# Patient Record
Sex: Female | Born: 1970
Health system: Southern US, Community
[De-identification: ages and names within clinical notes are randomized; demographics above are authoritative.]

## PROBLEM LIST (undated history)

## (undated) DIAGNOSIS — R6250 Unspecified lack of expected normal physiological development in childhood: Secondary | ICD-10-CM

## (undated) DIAGNOSIS — R569 Unspecified convulsions: Secondary | ICD-10-CM

## (undated) DIAGNOSIS — G40409 Other generalized epilepsy and epileptic syndromes, not intractable, without status epilepticus: Secondary | ICD-10-CM

## (undated) HISTORY — DX: Unspecified lack of expected normal physiological development in childhood: R62.50

## (undated) HISTORY — DX: Unspecified convulsions: R56.9

## (undated) HISTORY — DX: Other generalized epilepsy and epileptic syndromes, not intractable, without status epilepticus: G40.409

## (undated) HISTORY — PX: OTHER SURGICAL HISTORY: SHX169

---
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
## 1999-04-18 HISTORY — PX: OTHER SURGICAL HISTORY: SHX169

## 2002-06-26 ENCOUNTER — Other Ambulatory Visit: Admission: RE | Admit: 2002-06-26 | Discharge: 2002-06-26 | Payer: Self-pay | Admitting: Obstetrics & Gynecology

## 2003-06-30 ENCOUNTER — Other Ambulatory Visit: Admission: RE | Admit: 2003-06-30 | Discharge: 2003-06-30 | Payer: Self-pay | Admitting: Obstetrics & Gynecology

## 2004-12-07 ENCOUNTER — Other Ambulatory Visit: Admission: RE | Admit: 2004-12-07 | Discharge: 2004-12-07 | Payer: Self-pay | Admitting: Obstetrics & Gynecology

## 2011-01-20 DIAGNOSIS — G40219 Localization-related (focal) (partial) symptomatic epilepsy and epileptic syndromes with complex partial seizures, intractable, without status epilepticus: Secondary | ICD-10-CM | POA: Insufficient documentation

## 2011-05-30 ENCOUNTER — Encounter: Payer: Self-pay | Admitting: Neurology

## 2011-06-07 ENCOUNTER — Telehealth: Payer: Self-pay | Admitting: Neurology

## 2011-06-07 ENCOUNTER — Encounter: Payer: Self-pay | Admitting: Neurology

## 2011-06-07 ENCOUNTER — Ambulatory Visit (INDEPENDENT_AMBULATORY_CARE_PROVIDER_SITE_OTHER): Payer: Medicaid Other | Admitting: Neurology

## 2011-06-07 VITALS — BP 90/64 | HR 74 | Ht 66.5 in | Wt 124.7 lb

## 2011-06-07 DIAGNOSIS — G40909 Epilepsy, unspecified, not intractable, without status epilepticus: Secondary | ICD-10-CM

## 2011-06-07 MED ORDER — RISPERIDONE 0.5 MG PO TBDP: ORAL_TABLET | ORAL | Status: DC

## 2011-06-07 NOTE — Progress Notes (Signed)
Dear Dr. Reuel Boom,  Thank you for having me see Molly Duffy in consultation today at [facility] for her problem with seizures.  As you may recall, she is a 41 y.o. year old female with a history of developmental delay of unknown etiology who has had seizures since the age of [age] months.  These started with what sound like focal clonic seizures of the left hemibody.  She was initially placed on phenobarbital for her focal seizures, but because of sedation her mother immediately weaned her off it.  Several weeks later she developed generalized tonic clonc seizures.  She ended up being admitted to [facility] and apparently had a prolonged GTCS that required ICU care.  At that time, she was placed on dilantin and her development started to slow.  She did walk and talk at appropriate times.  She then began to have a chronic course of generalized tonic clonic seizures occuring about 1-2 times per month.  Many of the times these occurred with fever.  At some time she developed short absence seizures.  At 9 she was switched to Depakene, and Zarontin and then had mysoline added.  It also sounds like she has possible atonic seizures.  She moved her care from the [place] area to [place] at around 23.  She initially saw Billie Ruddy who attempted to start her on felbamate, but this caused weight loss.  She then was seen by Dr. Alvester Morin at [facility] who started her in a vigabatrin study.  Her seizures worsened at that time.  Neurontin was also added.  Over the last year, Molly Duffy has had a dramatic increased in GTCS frequency.  Over the last several months she has had an increase to over 20 per month.  Her mother has also noted an increase in what sound like agitated behaviors with continuous rocking that can last for days on end.  Her mother is giving her ativan for these rocking spells.  Over the last year her zonisamide has been decreased from a high of 75mg  down to 25mg  but because of worsening seizures was put back to  75mg .  Banzel was decreased from 500mg  per day to off because of sedation.  However, this was done in October and based on her seizure calendar there was an increase in her gtcs frequency at that time.  Clobazam was tried for 1 day in march, but because of an increase in her rocking behaviors this was stopped.  She has recently been started on the modified [employer] in early January.  It is unclear the frequency of her other seizure types or their nature.  It sounds like she may be having atonic seizures as well - where she just crumples.  As for the frequency of absence seizures this is unknown.  No clear history of myoclonic seizures.  Previous medications/interventions include: 1.  Depakene/Depakote - has had problems with thrombocytopenia(highest dose 500/750) 2.  phenobarbital - ineffective 3.  Mysoline - ineffective 4.  ethosuximide - ineffective 5.  Felbamate - weight loss 6.  vigabatrin - worsening seizures 7.  Neurontin - ineffective 8. clobazam - increased behaviors - only tried for a day 9.  banzel - sedation at does above 500mg  per day(!), did appear to help seizures 10. Dilantin - ineffective 11. VNS - not effective, still in place, but battery dead. 12.  Lamictal - ineffective 13.  Topamax - ineffective 14.  Keppra - ineffective  Last MRI brain 2001 1.  No obvious anomaly to account for  seizure disorder. 2.  Focal cerebellar atrophy and thickening of the skull to suggest chronic seizure medication use.  Routine EEG 2001 1.  Multifocal sharp waves. 2.  Generalized slow back ground  EMU Admission sept 99 - Event 1 turns head to left, rocks her body side to side and then leans forward; electrographically fast 12 Hz activity over right hemisphere, higher over frontal central area - Event 2 turns head to the right, rocks here body side to side; rhythmic fast seen over right hemisphere, higher over frontal central region, then rapidly spreads - Event 3 similar clinically to  event 1 but bilateral fast activities - captured 36 seizures over 2 hours.  EMU Admission December 99 Event 1 - Staring spell and then started to moan.  Rocks body from side to side, and then sits electrographically generalized fast activity. Event 2 - Staring spell and stops talking to her mother;  Raises head up and bends her body forward.  Clonic movements of the neck and eyelid twitching are noted.  Generalized fast with frontal predominance.   Event 3 - Black stare, turns neck to the right, with clonic movements of the face, twitching of both upper extremities then evolving to gtcs;  Again starts as generalized fast.  Past Medical History  Diagnosis Date  . Seizures   . Osteoporosis   . Developmental delay   . 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 Vns implant 2001    has been turned off  . Dental implants     History   Social History  . Marital Status: Single    Spouse Name: N/A    Number of Children: N/A  . Years of Education: N/A   Social History Main Topics  . Smoking status: Never Smoker   . Smokeless tobacco: Never Used  . Alcohol Use: No  . Drug Use: None  . Sexually Active: None   Other Topics Concern  . None   Social History Narrative  . None    No family history of neurologic disease.  Meds include: 1.  Divalprox 375/250 2.  lorazepam 1mg  prn 3.  zonisamide 75mg  qhs 4.  Vitamin D 5.  calcitonin  No Known Allergies    ROS:  13 systems were reviewed and are notable for ?constipation, patient has daily bowel movements but has "large stools" that her mother thinks are painful.  Patient has severe problem sleeping, typically gets up several hours after she has fallen asleep.  Has tried [place], rozerem, melatonin in the past with no effect.  Developmental delay as above.  All other review of systems are unremarkable.   Examination:  Filed Vitals:   06/07/11 1149  BP: 90/64  Pulse: 74  Height: 5'  6.5" (1.689 m)  Weight: 124 lb 11.2 oz (56.564 kg)     In general, thin appearing women, with thin face.  Cardiovascular: The patient has a regular rate and rhythm and no carotid bruits.  Fundoscopy:  Limited by patient cooperation.  Mental status:   Alert follows some commands.  Cranial Nerves: Pupils are equally round and reactive to light. Extraocular movements are intact without nystagmus. Facial sensation and muscles of mastication are intact. Muscles of facial expression are symmetric.Tongue protrusion, uvula, palate midline.    Motor:  The patient has normal bulk and tone. There are no adventitious movements. Grip strength the symmetric bilaterally.  Reflexes:  Quiet throughout.  Toes down  Coordination:  Some incoordination  reaching for objects.  Withdrawals to tickle bilaterally.  Gait and Station are wide based.   Impression/Recs 1.  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  The patient is currently under treated and I suspect this is due to parent's weariness with respect to medications in the past.  There does seem to be some correlation between the recent worsening of her seizures and weaning her off the Banzel.  However, the family would like to give the modified [employer] a while to work.  I would advocate no more than several more weeks to see if it is helping. I think I would then try to either increase her zonisamide aggressively or put her back on the Banzel.  I am going to see if we can get SCN1A testing as well as a [facility] given her syndromic appearance.  While this will not change management it may give the family some answers.  The spells of what appear to be abnormal behaviors of rocking back and forth and appearing in distress at first appeared to me as behavioral.  However, I am worried that they may represent "subclinical" epileptic activity.   I think it  would be invaluable for her to be admitted to the EMU at [facility] to determine if the spells of rocking and agitation are associated with epileptiform activity. 2.  Lack of sleep - It is possible the patient is having multiple seizures at night that is causing her sleep disturbance.  This would be captured during an EMU admission.  I have given the family risperidone 0.5mg  qhs to see if it can help her sleep as well.    I spent over 50% of this 80 minute visit counseling patient and family.  We will see the patient back in 6 weeks.  Thank you for having [place] see Molly Duffy in consultation.  Feel free to contact me with any questions.  Lupita Raider Modesto Charon, MD [facility], [place] [address] [place], [place] [address] Phone: [phone] Fax: [phone].

## 2011-06-07 NOTE — Telephone Encounter (Signed)
Called and spoke with the pharmacist. Verbal request given for the Risperdal M-tabs; #60; refill x 3; sig: take one at HS. May use 0.5 tab prn agitation up to twice a day. Pharmacist states they will not have the med until tomorrow. Called the patient's mother on her mobile number and let her know that the med was called but not available until tomorrow.

## 2011-06-07 NOTE — Patient Instructions (Addendum)
Go to the basement to have your labs drawn today.  Your next appointment with Dr. Modesto Charon is scheduled on 4/5 at 2:30.    [phone].

## 2011-06-11 ENCOUNTER — Encounter: Payer: Self-pay | Admitting: Neurology

## 2011-06-13 ENCOUNTER — Other Ambulatory Visit: Payer: Self-pay | Admitting: Neurology

## 2011-07-20 ENCOUNTER — Ambulatory Visit: Payer: Self-pay | Admitting: Neurology

## 2011-07-21 ENCOUNTER — Ambulatory Visit: Payer: Medicaid Other | Admitting: Neurology

## 2011-08-04 ENCOUNTER — Encounter: Payer: Self-pay | Admitting: Neurology

## 2011-08-04 ENCOUNTER — Ambulatory Visit (INDEPENDENT_AMBULATORY_CARE_PROVIDER_SITE_OTHER): Payer: Medicaid Other | Admitting: Neurology

## 2011-08-04 VITALS — BP 100/60 | HR 84 | Wt 133.0 lb

## 2011-08-04 DIAGNOSIS — G40409 Other generalized epilepsy and epileptic syndromes, not intractable, without status epilepticus: Secondary | ICD-10-CM

## 2011-08-04 DIAGNOSIS — G40909 Epilepsy, unspecified, not intractable, without status epilepticus: Secondary | ICD-10-CM

## 2011-08-04 MED ORDER — CLOBAZAM 5 MG PO TABS: 20.0000 mg | ORAL_TABLET | Freq: Every day | ORAL | Status: DC

## 2011-08-04 NOTE — Progress Notes (Signed)
Dear Dr. Reuel Boom,  I saw  Molly Duffy back in [facility] for her problem with symptomatic generalized epilepsy(possible LGS), and static encephalopathy.  As you may recall, she is a 41 y.o. year old female with a history of static encephalopathy of unknown etiology and both generalized tonic clonic and possible prolonged atypical absence like events.  She continues to have uncontrolled generalized tonic clonic seizures.  These typically occur in clusters and if spaces close together she does not return to normal between the events.  If they are less than 1 hour apart she typically has repetitive rocking movements between the events and this signals that she needs to go to the ED for Ativan injection.    When I first saw her she had started the modified Adkins diet and was on Depakote 250/375 and zonisamide 75 qhs.  She was have in excess of greater than 30 generalized tonic clonic seizures a month.  She has stopped the MAD.  Her Depakote was also recently increase to 375 due to decreased levels in the 40s.  Her latest platelets were 90.  She also has prolonged behavioral spells of rocking and agitation that can last days.  She was admitted to the EMU at [facility] to determine if these spells were seizures.  During the rocking and agitation spells she developed bifrontal slow activity, which was thought to possibly related to seizure.  She also had what sounds like tonic deviation of the eyes, stiffening of the arms- this was apparently captured during her admission as well although whether this was associated with any changes is unknown to me.  Because of poor venous access she got a port-a-cath placed.  She was also started on clobazam and this was increased to 15mg  qhs.  She initially did quite well on it, with 4 days of normal behavior and sleep.  However, she then began having her spells of rocking and agitation.  She has had two clusters of generalized tonic clonic seizures that  required her to go to the ED.     The folks at [facility] have also provided her with a midazolam 5mg  in each nostril to be used for her refractory seizures.  They used this once in the ED, but have not used it at home yet.  They do not have the nasal atomizers.  They also showed me a video of what appears to be decreased alertness, although she is still "working on a puzzle".  She has right arm jerking/tremors during this spell.     Medical history, social history, and family history were reviewed and have not changed since the last clinic visit.  Meds: 1.  Depakote 375 bid 2.  Zonisamide 75mg  qhs 3.  clobazam 15mg  qhs.   Allergies  Allergen Reactions  . Gabitril     Makes seizures worse  . Keppra     Makes seizures worse  . Phenobarbital     Makes seizures worse.    ROS:  13 systems were reviewed and are notable for some ataxia with the clobazam.  They also think her behavior may be worse on the clobazam.  She continues to have difficulty sleeping.  We tried a small dose of risperidone to no effect qhs.  However, they have stopped that for now as they want to try to focus on having her.  All other review of systems are unremarkable.  Exam: . Filed Vitals:   08/04/11 1158  BP: 100/60  Pulse: 84  Weight:  133 lb (60.328 kg)    In general, thin girl with narrow pixie like face.  Alert, EOM full, no nystagmus.  Motor:  Moves all limbs equally.  Gait:  Mildly ataxic gait.    Impression/Recommendations:  1.  SGE - I am going to increase her morning clobazam to 5mg /15mg .  We can continue to increase this as tolerated.  We may consider restarting the Banzel at a later date.  I am assuming that the spells of rocking are ictal for now.  I unfortunately could not open the continuous EEG on disk so I will contact the folks at [facility] about getting another copy.  We will see the patient back in 6 weeks.  Lupita Raider Modesto Charon, MD [facility], [place]    More than 50% of  this 40 minute appointment was spent counselling the patient.

## 2011-08-17 ENCOUNTER — Telehealth: Payer: Self-pay

## 2011-08-17 NOTE — Telephone Encounter (Signed)
Pt's mother called and pt is having a bad day and has had one grang mal seizure today and her mom feels she is going to have more and she wants to know if she should go ahead and give her the versed prescribed by [facility] or should she wait until she has more seizures.   [phone].

## 2011-08-21 NOTE — Telephone Encounter (Signed)
Dr. Modesto Charon spoke with pt's mother.

## 2011-10-06 ENCOUNTER — Telehealth: Payer: Self-pay | Admitting: Neurology

## 2011-10-06 NOTE — Telephone Encounter (Signed)
Message copied by Benay Spice on Fri Oct 06, 2011  1:18 PM ------      Message from: Milas Gain      Created: Fri Oct 06, 2011  9:38 AM       Jan.  Could you give Molly Duffy's mom a call and let her know that I heard from Nigel Berthold that the nasal midazolam was not working.  We can try intranasal lorazepam if they want, but I need to know the dose of IV lorazepam she usually gets at the ED that stops her seizures so we can dose the intranasal lorazepam.            THx.  M

## 2011-10-06 NOTE — Telephone Encounter (Signed)
Spoke with the patient's mother. She states they were just in the ER this morning. She gets at least Ativan 2 mg IV for the seizures; sometimes it even takes 3 mg IV. She is very frustrated and needs something to keep them out of the ER. She is also wondering about a port-a-cath in order to administer IV meds herself. She has a follow up appointment with Dr. Modesto Charon next Friday, June 28th. She hopes to discuss other options at this time.

## 2011-10-13 ENCOUNTER — Encounter: Payer: Self-pay | Admitting: Neurology

## 2011-10-13 ENCOUNTER — Ambulatory Visit (INDEPENDENT_AMBULATORY_CARE_PROVIDER_SITE_OTHER): Payer: Medicaid Other | Admitting: Neurology

## 2011-10-13 VITALS — BP 110/66 | HR 78 | Wt 130.0 lb

## 2011-10-13 DIAGNOSIS — G40309 Generalized idiopathic epilepsy and epileptic syndromes, not intractable, without status epilepticus: Secondary | ICD-10-CM

## 2011-10-13 DIAGNOSIS — G40834 Dravet syndrome, intractable, without status epilepticus: Secondary | ICD-10-CM

## 2011-10-13 MED ORDER — LORAZEPAM 2 MG/ML IJ SOLN: INTRAMUSCULAR | Status: DC

## 2011-10-15 NOTE — Progress Notes (Signed)
Dear Dr. Reuel Boom,  I saw  Molly Duffy back in [facility] for her problem with her symptomatic generalized epilepsy.  As you may recall, she is a 41 y.o. year old female with a history of static encephalopathy and SGE previously diagnosed at [facility].  She currently has very frequent generalized tonic clonic seizures as well as prolonged what I assume to be atypical absence seizures.  At her last visit I increased her clobazam 5/15.  However, they only kept her on the higher dose of clobazam for 2 weeks as they felt it made her atypical absence spells worse.  She remains on the Depakote 375 bid(limited dosing by thrombocytopenia) and zonegran 75mg  qh(sedation) as well.  She is having generalized tonic clonic seizures at least once per week.  She is having frequent atypical absence seizures in between these events that can go on for days.  They are characterized by varying degrees of responsiveness, rocking, downward gaze, and eyelid twitching.    She had a port recently placed as she was going to the ED so often for the seizures and getting ativan.  Her family tried midazolam 5mg  per nostril and this seemed to stop her seizure, but they restarted in to 1 hour.  They are interested in trying IN lorazepam.   Medical history, social history, and family history were reviewed and have not changed since the last clinic visit.  Current Outpatient Prescriptions on File Prior to Visit  Medication Sig Dispense Refill  . calcitonin, salmon, (MIACALCIN/FORTICAL) 200 UNIT/ACT nasal spray Place 1 spray into the nose daily.      . cholecalciferol (VITAMIN D) 1000 UNITS tablet Take 1,000 Units by mouth daily.      . CLOBAZAM PO Take 15 mg by mouth daily.      . divalproex (DEPAKOTE SPRINKLE) 125 MG capsule Take 125 mg by mouth AC breakfast. Take with the 250 mg Depakote to equal 375 mg TID      . divalproex (DEPAKOTE) 250 MG DR tablet Take 250 mg by mouth at bedtime.      .  fish oil-omega-3 fatty acids 1000 MG capsule Take 1 g by mouth daily.      Marland Kitchen LORazepam (ATIVAN) 1 MG tablet Take 1 mg by mouth every 8 (eight) hours. One or two for seizures as needed.      . magnesium oxide (MAG-OX) 400 MG tablet Take 400 mg by mouth daily.      . Multiple Vitamin (MULTIVITAMIN) tablet Take 1 tablet by mouth daily.      . nitrofurantoin, macrocrystal-monohydrate, (MACROBID) 100 MG capsule Take 100 mg by mouth 2 (two) times daily. 50 mg prn UTI      . zonisamide (ZONEGRAN) 25 MG capsule Take 75 mg by mouth daily.      . Clobazam 5 MG TABS Take 20 mg by mouth daily.  120 tablet  3  . risperiDONE (RISPERDAL M-TABS) 0.5 MG disintegrating tablet use 0.5 tab before bed.  May use 0.5 tab as needed for agitation up to twice per day.  60 tablet  3    Allergies  Allergen Reactions  . Levetiracetam     Makes seizures worse  . Phenobarbital     Makes seizures worse.  . Tiagabine Hcl     Makes seizures worse    ROS:  13 systems were reviewed and are notable for static encephalopathy.  All other review of systems are unremarkable.  Exam: . Filed Vitals:  10/13/11 1135  BP: 110/66  Pulse: 78  Weight: 130 lb (58.968 kg)    In general, thin appearing women. She has frequent spells of decreased alertness but more vocalization with twitching of eyes and downward deviation of eyes.  These seem to be interruptible to some extent.  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  Chromosomal microarray was negative.  Impression/Recommendations:  1.  Dravet's Syndrome - I have encouraged them to try the clobazam 5/15 for at least 1 month.  I emphasized that we need at least a month to assess response.  I have also written them a prescription for Ativan 1mg  IN give 0.35ml in each nostril.  I am interested in whether this stops her seizures and then keeps them stopped.  We will see the patient back in 2 months.  Lupita Raider Modesto Charon, MD [facility],  [place]    Over 50% of this 40 minute appointment was spent counseling the patient and family.

## 2011-10-16 DIAGNOSIS — G40834 Dravet syndrome, intractable, without status epilepticus: Secondary | ICD-10-CM | POA: Insufficient documentation

## 2011-12-01 ENCOUNTER — Telehealth: Payer: Self-pay | Admitting: Neurology

## 2011-12-01 NOTE — Telephone Encounter (Signed)
[facility] called to advise that pt's 5 mg Clobazam has been discontinued. The pharmacist says they can only do 10 mg or 20 mg now. How should he refill the rx?

## 2011-12-04 ENCOUNTER — Other Ambulatory Visit: Payer: Self-pay | Admitting: Neurology

## 2011-12-04 MED ORDER — CLOBAZAM 10 MG PO TABS: 10.0000 mg | ORAL_TABLET | ORAL | Status: DC

## 2011-12-04 NOTE — Telephone Encounter (Signed)
dispense clobazam 10 mg tabs, #60, 5 refills, take 0.5 in the am and 1.5 at night.

## 2011-12-04 NOTE — Telephone Encounter (Signed)
Please call [facility] and give them the instructions below. Thanks.

## 2011-12-04 NOTE — Telephone Encounter (Signed)
Called the pharmacy. Information given as per Dr. Modesto Charon below.

## 2011-12-06 ENCOUNTER — Other Ambulatory Visit: Payer: Self-pay | Admitting: Neurology

## 2011-12-06 ENCOUNTER — Telehealth: Payer: Self-pay | Admitting: Neurology

## 2011-12-06 MED ORDER — LORAZEPAM 4 MG/ML IJ SOLN: INTRAMUSCULAR | Status: AC

## 2011-12-06 NOTE — Telephone Encounter (Signed)
Called new script in as directed by Dr. Modesto Charon to [facility]: Ativan IV 4 mg/ml; sig: 0.5 mg (2 mg) in one nostril prn prolonged seizure; no refills; dispense 5 mls. [phone].

## 2011-12-11 ENCOUNTER — Telehealth: Payer: Self-pay

## 2011-12-11 NOTE — Telephone Encounter (Signed)
Call from pt's mom, they reduced her depakote and her level went up to 99 from 80 last week and her platelet count went from 82 to 105.  She is aware that Dr. Modesto Charon is out of town this week and is going to check with her other doctor.

## 2011-12-12 NOTE — Telephone Encounter (Signed)
If I am to understand correctly I decreased her Depakote DR to 250/375 and her level went up.  I am not too worried about this, particularly since her plts are ok.  You can tell Molly Duffy that I looked up papers on Depakote and Onfi interactions and Onfi does seem to increase the Depakote level.

## 2011-12-13 NOTE — Telephone Encounter (Signed)
Pt's mom aware, she did speak with Adelyne's doctors at [facility] and they decreased her depakote another 125mg  in the morning.

## 2011-12-19 ENCOUNTER — Other Ambulatory Visit: Payer: Self-pay | Admitting: Neurology

## 2011-12-19 ENCOUNTER — Ambulatory Visit: Payer: Medicaid Other | Admitting: Neurology

## 2011-12-19 MED ORDER — CLOBAZAM 10 MG PO TABS: 10.0000 mg | ORAL_TABLET | ORAL | Status: DC

## 2014-06-09 DIAGNOSIS — T82898D Other specified complication of vascular prosthetic devices, implants and grafts, subsequent encounter: Secondary | ICD-10-CM | POA: Diagnosis not present

## 2014-07-21 DIAGNOSIS — T82858A Stenosis of vascular prosthetic devices, implants and grafts, initial encounter: Secondary | ICD-10-CM | POA: Diagnosis not present

## 2014-07-21 DIAGNOSIS — Z452 Encounter for adjustment and management of vascular access device: Secondary | ICD-10-CM | POA: Diagnosis not present

## 2014-07-21 DIAGNOSIS — Z79899 Other long term (current) drug therapy: Secondary | ICD-10-CM | POA: Diagnosis not present

## 2014-07-21 DIAGNOSIS — G40309 Generalized idiopathic epilepsy and epileptic syndromes, not intractable, without status epilepticus: Secondary | ICD-10-CM | POA: Diagnosis not present

## 2014-07-21 DIAGNOSIS — G40409 Other generalized epilepsy and epileptic syndromes, not intractable, without status epilepticus: Secondary | ICD-10-CM | POA: Diagnosis not present

## 2014-08-06 DIAGNOSIS — M810 Age-related osteoporosis without current pathological fracture: Secondary | ICD-10-CM | POA: Diagnosis not present

## 2014-08-06 DIAGNOSIS — Z1389 Encounter for screening for other disorder: Secondary | ICD-10-CM | POA: Diagnosis not present

## 2014-08-06 DIAGNOSIS — K0262 Dental caries on smooth surface penetrating into dentin: Secondary | ICD-10-CM | POA: Diagnosis not present

## 2014-08-06 DIAGNOSIS — G40309 Generalized idiopathic epilepsy and epileptic syndromes, not intractable, without status epilepticus: Secondary | ICD-10-CM | POA: Diagnosis not present

## 2014-08-06 DIAGNOSIS — Z9189 Other specified personal risk factors, not elsewhere classified: Secondary | ICD-10-CM | POA: Diagnosis not present

## 2014-09-01 DIAGNOSIS — T859XXD Unspecified complication of internal prosthetic device, implant and graft, subsequent encounter: Secondary | ICD-10-CM | POA: Diagnosis not present

## 2014-09-01 DIAGNOSIS — D689 Coagulation defect, unspecified: Secondary | ICD-10-CM | POA: Diagnosis not present

## 2014-09-01 DIAGNOSIS — T82868A Thrombosis of vascular prosthetic devices, implants and grafts, initial encounter: Secondary | ICD-10-CM | POA: Diagnosis not present

## 2014-09-01 DIAGNOSIS — G40309 Generalized idiopathic epilepsy and epileptic syndromes, not intractable, without status epilepticus: Secondary | ICD-10-CM | POA: Diagnosis not present

## 2014-09-03 ENCOUNTER — Other Ambulatory Visit: Payer: Self-pay | Admitting: Obstetrics & Gynecology

## 2014-09-03 DIAGNOSIS — Z1231 Encounter for screening mammogram for malignant neoplasm of breast: Secondary | ICD-10-CM

## 2014-09-23 DIAGNOSIS — Z1231 Encounter for screening mammogram for malignant neoplasm of breast: Secondary | ICD-10-CM | POA: Diagnosis not present

## 2014-09-24 DIAGNOSIS — G40309 Generalized idiopathic epilepsy and epileptic syndromes, not intractable, without status epilepticus: Secondary | ICD-10-CM | POA: Diagnosis not present

## 2014-10-15 DIAGNOSIS — Z79899 Other long term (current) drug therapy: Secondary | ICD-10-CM | POA: Diagnosis not present

## 2014-10-15 DIAGNOSIS — G40409 Other generalized epilepsy and epileptic syndromes, not intractable, without status epilepticus: Secondary | ICD-10-CM | POA: Diagnosis not present

## 2014-10-15 DIAGNOSIS — E7220 Disorder of urea cycle metabolism, unspecified: Secondary | ICD-10-CM | POA: Diagnosis not present

## 2014-10-15 DIAGNOSIS — D689 Coagulation defect, unspecified: Secondary | ICD-10-CM | POA: Diagnosis not present

## 2014-12-09 DIAGNOSIS — Z95828 Presence of other vascular implants and grafts: Secondary | ICD-10-CM | POA: Diagnosis not present

## 2015-01-28 DIAGNOSIS — G40309 Generalized idiopathic epilepsy and epileptic syndromes, not intractable, without status epilepticus: Secondary | ICD-10-CM | POA: Diagnosis not present

## 2015-02-04 DIAGNOSIS — Z23 Encounter for immunization: Secondary | ICD-10-CM | POA: Diagnosis not present

## 2015-03-04 DIAGNOSIS — M810 Age-related osteoporosis without current pathological fracture: Secondary | ICD-10-CM | POA: Diagnosis not present

## 2015-03-04 DIAGNOSIS — G40309 Generalized idiopathic epilepsy and epileptic syndromes, not intractable, without status epilepticus: Secondary | ICD-10-CM | POA: Diagnosis not present

## 2015-03-05 DIAGNOSIS — Z452 Encounter for adjustment and management of vascular access device: Secondary | ICD-10-CM | POA: Diagnosis not present

## 2015-03-05 DIAGNOSIS — Z9889 Other specified postprocedural states: Secondary | ICD-10-CM | POA: Diagnosis not present

## 2015-03-30 DIAGNOSIS — Z124 Encounter for screening for malignant neoplasm of cervix: Secondary | ICD-10-CM | POA: Diagnosis not present

## 2015-04-06 DIAGNOSIS — N959 Unspecified menopausal and perimenopausal disorder: Secondary | ICD-10-CM | POA: Diagnosis not present

## 2015-04-06 DIAGNOSIS — G40309 Generalized idiopathic epilepsy and epileptic syndromes, not intractable, without status epilepticus: Secondary | ICD-10-CM | POA: Diagnosis not present

## 2015-05-20 DIAGNOSIS — G40409 Other generalized epilepsy and epileptic syndromes, not intractable, without status epilepticus: Secondary | ICD-10-CM | POA: Diagnosis not present

## 2015-05-20 DIAGNOSIS — Z452 Encounter for adjustment and management of vascular access device: Secondary | ICD-10-CM | POA: Diagnosis not present

## 2015-05-20 DIAGNOSIS — G40219 Localization-related (focal) (partial) symptomatic epilepsy and epileptic syndromes with complex partial seizures, intractable, without status epilepticus: Secondary | ICD-10-CM | POA: Diagnosis not present

## 2015-07-15 DIAGNOSIS — R635 Abnormal weight gain: Secondary | ICD-10-CM | POA: Diagnosis not present

## 2015-07-15 DIAGNOSIS — D689 Coagulation defect, unspecified: Secondary | ICD-10-CM | POA: Diagnosis not present

## 2015-07-15 DIAGNOSIS — G40409 Other generalized epilepsy and epileptic syndromes, not intractable, without status epilepticus: Secondary | ICD-10-CM | POA: Diagnosis not present

## 2015-07-15 DIAGNOSIS — Z452 Encounter for adjustment and management of vascular access device: Secondary | ICD-10-CM | POA: Diagnosis not present

## 2015-08-25 DIAGNOSIS — Z1389 Encounter for screening for other disorder: Secondary | ICD-10-CM | POA: Diagnosis not present

## 2015-08-25 DIAGNOSIS — M810 Age-related osteoporosis without current pathological fracture: Secondary | ICD-10-CM | POA: Diagnosis not present

## 2015-08-25 DIAGNOSIS — Z9189 Other specified personal risk factors, not elsewhere classified: Secondary | ICD-10-CM | POA: Diagnosis not present

## 2015-08-25 DIAGNOSIS — G40309 Generalized idiopathic epilepsy and epileptic syndromes, not intractable, without status epilepticus: Secondary | ICD-10-CM | POA: Diagnosis not present

## 2015-11-03 DIAGNOSIS — G40219 Localization-related (focal) (partial) symptomatic epilepsy and epileptic syndromes with complex partial seizures, intractable, without status epilepticus: Secondary | ICD-10-CM | POA: Diagnosis not present

## 2015-11-03 DIAGNOSIS — G40409 Other generalized epilepsy and epileptic syndromes, not intractable, without status epilepticus: Secondary | ICD-10-CM | POA: Diagnosis not present

## 2015-12-09 DIAGNOSIS — Z452 Encounter for adjustment and management of vascular access device: Secondary | ICD-10-CM | POA: Diagnosis not present

## 2015-12-09 DIAGNOSIS — D689 Coagulation defect, unspecified: Secondary | ICD-10-CM | POA: Diagnosis not present

## 2015-12-09 DIAGNOSIS — G40409 Other generalized epilepsy and epileptic syndromes, not intractable, without status epilepticus: Secondary | ICD-10-CM | POA: Diagnosis not present

## 2016-01-25 DIAGNOSIS — G40409 Other generalized epilepsy and epileptic syndromes, not intractable, without status epilepticus: Secondary | ICD-10-CM | POA: Diagnosis not present

## 2016-01-31 DIAGNOSIS — G40409 Other generalized epilepsy and epileptic syndromes, not intractable, without status epilepticus: Secondary | ICD-10-CM | POA: Diagnosis not present

## 2016-01-31 DIAGNOSIS — Z452 Encounter for adjustment and management of vascular access device: Secondary | ICD-10-CM | POA: Diagnosis not present

## 2016-02-22 DIAGNOSIS — M818 Other osteoporosis without current pathological fracture: Secondary | ICD-10-CM | POA: Diagnosis not present

## 2016-02-22 DIAGNOSIS — G40309 Generalized idiopathic epilepsy and epileptic syndromes, not intractable, without status epilepticus: Secondary | ICD-10-CM | POA: Diagnosis not present

## 2016-02-22 DIAGNOSIS — Z6820 Body mass index (BMI) 20.0-20.9, adult: Secondary | ICD-10-CM | POA: Diagnosis not present

## 2016-02-22 DIAGNOSIS — Z9189 Other specified personal risk factors, not elsewhere classified: Secondary | ICD-10-CM | POA: Diagnosis not present

## 2016-02-22 DIAGNOSIS — G40812 Lennox-Gastaut syndrome, not intractable, without status epilepticus: Secondary | ICD-10-CM | POA: Diagnosis not present

## 2016-02-22 DIAGNOSIS — Z23 Encounter for immunization: Secondary | ICD-10-CM | POA: Diagnosis not present

## 2016-04-13 DIAGNOSIS — Z452 Encounter for adjustment and management of vascular access device: Secondary | ICD-10-CM | POA: Diagnosis not present

## 2016-04-13 DIAGNOSIS — G40409 Other generalized epilepsy and epileptic syndromes, not intractable, without status epilepticus: Secondary | ICD-10-CM | POA: Diagnosis not present

## 2016-05-25 DIAGNOSIS — D689 Coagulation defect, unspecified: Secondary | ICD-10-CM | POA: Diagnosis not present

## 2016-05-25 DIAGNOSIS — G40409 Other generalized epilepsy and epileptic syndromes, not intractable, without status epilepticus: Secondary | ICD-10-CM | POA: Diagnosis not present

## 2016-07-06 DIAGNOSIS — G40219 Localization-related (focal) (partial) symptomatic epilepsy and epileptic syndromes with complex partial seizures, intractable, without status epilepticus: Secondary | ICD-10-CM | POA: Diagnosis not present

## 2016-08-21 DIAGNOSIS — D696 Thrombocytopenia, unspecified: Secondary | ICD-10-CM | POA: Diagnosis not present

## 2016-08-21 DIAGNOSIS — R2681 Unsteadiness on feet: Secondary | ICD-10-CM | POA: Diagnosis not present

## 2016-08-21 DIAGNOSIS — N912 Amenorrhea, unspecified: Secondary | ICD-10-CM | POA: Diagnosis not present

## 2016-08-21 DIAGNOSIS — G40409 Other generalized epilepsy and epileptic syndromes, not intractable, without status epilepticus: Secondary | ICD-10-CM | POA: Diagnosis not present

## 2016-08-22 DIAGNOSIS — Z6820 Body mass index (BMI) 20.0-20.9, adult: Secondary | ICD-10-CM | POA: Diagnosis not present

## 2016-08-22 DIAGNOSIS — M818 Other osteoporosis without current pathological fracture: Secondary | ICD-10-CM | POA: Diagnosis not present

## 2016-08-22 DIAGNOSIS — G40309 Generalized idiopathic epilepsy and epileptic syndromes, not intractable, without status epilepticus: Secondary | ICD-10-CM | POA: Diagnosis not present

## 2016-08-22 DIAGNOSIS — G40812 Lennox-Gastaut syndrome, not intractable, without status epilepticus: Secondary | ICD-10-CM | POA: Diagnosis not present

## 2016-09-28 DIAGNOSIS — Z452 Encounter for adjustment and management of vascular access device: Secondary | ICD-10-CM | POA: Diagnosis not present

## 2016-10-23 DIAGNOSIS — M25562 Pain in left knee: Secondary | ICD-10-CM | POA: Diagnosis not present

## 2016-10-23 DIAGNOSIS — G40309 Generalized idiopathic epilepsy and epileptic syndromes, not intractable, without status epilepticus: Secondary | ICD-10-CM | POA: Diagnosis not present

## 2016-10-23 DIAGNOSIS — S8002XA Contusion of left knee, initial encounter: Secondary | ICD-10-CM | POA: Diagnosis not present

## 2016-10-23 DIAGNOSIS — Z6820 Body mass index (BMI) 20.0-20.9, adult: Secondary | ICD-10-CM | POA: Diagnosis not present

## 2016-10-27 DIAGNOSIS — S8002XA Contusion of left knee, initial encounter: Secondary | ICD-10-CM | POA: Diagnosis not present

## 2016-10-27 DIAGNOSIS — G40309 Generalized idiopathic epilepsy and epileptic syndromes, not intractable, without status epilepticus: Secondary | ICD-10-CM | POA: Diagnosis not present

## 2016-10-27 DIAGNOSIS — M7989 Other specified soft tissue disorders: Secondary | ICD-10-CM | POA: Diagnosis not present

## 2016-10-27 DIAGNOSIS — M79662 Pain in left lower leg: Secondary | ICD-10-CM | POA: Diagnosis not present

## 2016-10-27 DIAGNOSIS — M25562 Pain in left knee: Secondary | ICD-10-CM | POA: Diagnosis not present

## 2016-10-27 DIAGNOSIS — R600 Localized edema: Secondary | ICD-10-CM | POA: Diagnosis not present

## 2016-10-27 DIAGNOSIS — Z6820 Body mass index (BMI) 20.0-20.9, adult: Secondary | ICD-10-CM | POA: Diagnosis not present

## 2016-11-06 DIAGNOSIS — Z5181 Encounter for therapeutic drug level monitoring: Secondary | ICD-10-CM | POA: Diagnosis not present

## 2016-11-06 DIAGNOSIS — Z79899 Other long term (current) drug therapy: Secondary | ICD-10-CM | POA: Diagnosis not present

## 2016-11-06 DIAGNOSIS — T82898A Other specified complication of vascular prosthetic devices, implants and grafts, initial encounter: Secondary | ICD-10-CM | POA: Diagnosis not present

## 2016-11-06 DIAGNOSIS — G40409 Other generalized epilepsy and epileptic syndromes, not intractable, without status epilepticus: Secondary | ICD-10-CM | POA: Diagnosis not present

## 2016-12-20 DIAGNOSIS — G40309 Generalized idiopathic epilepsy and epileptic syndromes, not intractable, without status epilepticus: Secondary | ICD-10-CM | POA: Diagnosis not present

## 2016-12-20 DIAGNOSIS — Z0001 Encounter for general adult medical examination with abnormal findings: Secondary | ICD-10-CM | POA: Diagnosis not present

## 2016-12-20 DIAGNOSIS — M818 Other osteoporosis without current pathological fracture: Secondary | ICD-10-CM | POA: Diagnosis not present

## 2016-12-20 DIAGNOSIS — G40812 Lennox-Gastaut syndrome, not intractable, without status epilepticus: Secondary | ICD-10-CM | POA: Diagnosis not present

## 2017-02-06 DIAGNOSIS — G40409 Other generalized epilepsy and epileptic syndromes, not intractable, without status epilepticus: Secondary | ICD-10-CM | POA: Diagnosis not present

## 2017-03-19 DIAGNOSIS — R260 Ataxic gait: Secondary | ICD-10-CM | POA: Diagnosis not present

## 2017-03-19 DIAGNOSIS — Z79899 Other long term (current) drug therapy: Secondary | ICD-10-CM | POA: Diagnosis not present

## 2017-03-19 DIAGNOSIS — G40401 Other generalized epilepsy and epileptic syndromes, not intractable, with status epilepticus: Secondary | ICD-10-CM | POA: Diagnosis not present

## 2017-03-19 DIAGNOSIS — G40309 Generalized idiopathic epilepsy and epileptic syndromes, not intractable, without status epilepticus: Secondary | ICD-10-CM | POA: Diagnosis not present

## 2017-03-19 DIAGNOSIS — R630 Anorexia: Secondary | ICD-10-CM | POA: Diagnosis not present

## 2017-04-05 DIAGNOSIS — Z79899 Other long term (current) drug therapy: Secondary | ICD-10-CM | POA: Diagnosis not present

## 2017-04-05 DIAGNOSIS — R9401 Abnormal electroencephalogram [EEG]: Secondary | ICD-10-CM | POA: Diagnosis not present

## 2017-04-05 DIAGNOSIS — G40409 Other generalized epilepsy and epileptic syndromes, not intractable, without status epilepticus: Secondary | ICD-10-CM | POA: Diagnosis not present

## 2017-04-13 DIAGNOSIS — Z452 Encounter for adjustment and management of vascular access device: Secondary | ICD-10-CM | POA: Diagnosis not present

## 2017-04-13 DIAGNOSIS — G40409 Other generalized epilepsy and epileptic syndromes, not intractable, without status epilepticus: Secondary | ICD-10-CM | POA: Diagnosis not present

## 2017-05-12 DIAGNOSIS — L03116 Cellulitis of left lower limb: Secondary | ICD-10-CM | POA: Diagnosis not present

## 2017-05-14 DIAGNOSIS — L03116 Cellulitis of left lower limb: Secondary | ICD-10-CM | POA: Diagnosis not present

## 2017-06-12 DIAGNOSIS — M818 Other osteoporosis without current pathological fracture: Secondary | ICD-10-CM | POA: Diagnosis not present

## 2017-06-12 DIAGNOSIS — M8588 Other specified disorders of bone density and structure, other site: Secondary | ICD-10-CM | POA: Diagnosis not present

## 2017-06-12 DIAGNOSIS — M810 Age-related osteoporosis without current pathological fracture: Secondary | ICD-10-CM | POA: Diagnosis not present

## 2017-06-19 DIAGNOSIS — G40309 Generalized idiopathic epilepsy and epileptic syndromes, not intractable, without status epilepticus: Secondary | ICD-10-CM | POA: Diagnosis not present

## 2017-06-19 DIAGNOSIS — Z6820 Body mass index (BMI) 20.0-20.9, adult: Secondary | ICD-10-CM | POA: Diagnosis not present

## 2017-06-19 DIAGNOSIS — G40812 Lennox-Gastaut syndrome, not intractable, without status epilepticus: Secondary | ICD-10-CM | POA: Diagnosis not present

## 2017-06-19 DIAGNOSIS — M818 Other osteoporosis without current pathological fracture: Secondary | ICD-10-CM | POA: Diagnosis not present

## 2017-06-19 DIAGNOSIS — Z1389 Encounter for screening for other disorder: Secondary | ICD-10-CM | POA: Diagnosis not present

## 2017-06-20 DIAGNOSIS — M818 Other osteoporosis without current pathological fracture: Secondary | ICD-10-CM | POA: Diagnosis not present

## 2017-06-20 DIAGNOSIS — Z13220 Encounter for screening for lipoid disorders: Secondary | ICD-10-CM | POA: Diagnosis not present

## 2017-06-20 DIAGNOSIS — G40309 Generalized idiopathic epilepsy and epileptic syndromes, not intractable, without status epilepticus: Secondary | ICD-10-CM | POA: Diagnosis not present

## 2017-06-20 DIAGNOSIS — G40409 Other generalized epilepsy and epileptic syndromes, not intractable, without status epilepticus: Secondary | ICD-10-CM | POA: Diagnosis not present

## 2017-06-20 DIAGNOSIS — Z79899 Other long term (current) drug therapy: Secondary | ICD-10-CM | POA: Diagnosis not present

## 2017-08-22 DIAGNOSIS — Z452 Encounter for adjustment and management of vascular access device: Secondary | ICD-10-CM | POA: Diagnosis not present

## 2017-08-22 DIAGNOSIS — G40219 Localization-related (focal) (partial) symptomatic epilepsy and epileptic syndromes with complex partial seizures, intractable, without status epilepticus: Secondary | ICD-10-CM | POA: Diagnosis not present

## 2017-10-01 DIAGNOSIS — G40219 Localization-related (focal) (partial) symptomatic epilepsy and epileptic syndromes with complex partial seizures, intractable, without status epilepticus: Secondary | ICD-10-CM | POA: Diagnosis not present

## 2017-10-01 DIAGNOSIS — Z79899 Other long term (current) drug therapy: Secondary | ICD-10-CM | POA: Diagnosis not present

## 2017-10-01 DIAGNOSIS — G40409 Other generalized epilepsy and epileptic syndromes, not intractable, without status epilepticus: Secondary | ICD-10-CM | POA: Diagnosis not present

## 2017-10-02 DIAGNOSIS — Z124 Encounter for screening for malignant neoplasm of cervix: Secondary | ICD-10-CM | POA: Diagnosis not present

## 2017-10-02 DIAGNOSIS — Z6821 Body mass index (BMI) 21.0-21.9, adult: Secondary | ICD-10-CM | POA: Diagnosis not present

## 2017-10-03 ENCOUNTER — Other Ambulatory Visit: Payer: Self-pay | Admitting: Obstetrics & Gynecology

## 2017-10-03 DIAGNOSIS — Z1231 Encounter for screening mammogram for malignant neoplasm of breast: Secondary | ICD-10-CM

## 2017-10-15 DIAGNOSIS — Z6820 Body mass index (BMI) 20.0-20.9, adult: Secondary | ICD-10-CM | POA: Diagnosis not present

## 2017-10-15 DIAGNOSIS — M818 Other osteoporosis without current pathological fracture: Secondary | ICD-10-CM | POA: Diagnosis not present

## 2017-10-15 DIAGNOSIS — G40309 Generalized idiopathic epilepsy and epileptic syndromes, not intractable, without status epilepticus: Secondary | ICD-10-CM | POA: Diagnosis not present

## 2017-10-15 DIAGNOSIS — G40812 Lennox-Gastaut syndrome, not intractable, without status epilepticus: Secondary | ICD-10-CM | POA: Diagnosis not present

## 2017-10-17 DIAGNOSIS — G40812 Lennox-Gastaut syndrome, not intractable, without status epilepticus: Secondary | ICD-10-CM | POA: Diagnosis not present

## 2017-10-17 DIAGNOSIS — M810 Age-related osteoporosis without current pathological fracture: Secondary | ICD-10-CM | POA: Diagnosis not present

## 2017-10-17 DIAGNOSIS — Z9181 History of falling: Secondary | ICD-10-CM | POA: Diagnosis not present

## 2017-10-17 DIAGNOSIS — G40309 Generalized idiopathic epilepsy and epileptic syndromes, not intractable, without status epilepticus: Secondary | ICD-10-CM | POA: Diagnosis not present

## 2017-10-17 DIAGNOSIS — R2689 Other abnormalities of gait and mobility: Secondary | ICD-10-CM | POA: Diagnosis not present

## 2017-10-19 ENCOUNTER — Other Ambulatory Visit: Payer: Self-pay

## 2017-10-19 ENCOUNTER — Ambulatory Visit
Admission: RE | Admit: 2017-10-19 | Discharge: 2017-10-19 | Disposition: A | Discharge status: Home / Self Care | Payer: Medicare Other | Source: Ambulatory Visit | Attending: Obstetrics & Gynecology | Admitting: Obstetrics & Gynecology

## 2017-10-19 DIAGNOSIS — N6489 Other specified disorders of breast: Secondary | ICD-10-CM | POA: Diagnosis not present

## 2017-10-19 DIAGNOSIS — Z1231 Encounter for screening mammogram for malignant neoplasm of breast: Secondary | ICD-10-CM

## 2017-10-22 DIAGNOSIS — R2689 Other abnormalities of gait and mobility: Secondary | ICD-10-CM | POA: Diagnosis not present

## 2017-10-22 DIAGNOSIS — Z9181 History of falling: Secondary | ICD-10-CM | POA: Diagnosis not present

## 2017-10-22 DIAGNOSIS — G40309 Generalized idiopathic epilepsy and epileptic syndromes, not intractable, without status epilepticus: Secondary | ICD-10-CM | POA: Diagnosis not present

## 2017-10-22 DIAGNOSIS — G40812 Lennox-Gastaut syndrome, not intractable, without status epilepticus: Secondary | ICD-10-CM | POA: Diagnosis not present

## 2017-10-22 DIAGNOSIS — M810 Age-related osteoporosis without current pathological fracture: Secondary | ICD-10-CM | POA: Diagnosis not present

## 2017-10-23 DIAGNOSIS — T82868A Thrombosis of vascular prosthetic devices, implants and grafts, initial encounter: Secondary | ICD-10-CM | POA: Diagnosis not present

## 2017-10-23 DIAGNOSIS — G40219 Localization-related (focal) (partial) symptomatic epilepsy and epileptic syndromes with complex partial seizures, intractable, without status epilepticus: Secondary | ICD-10-CM | POA: Diagnosis not present

## 2017-10-24 DIAGNOSIS — G40309 Generalized idiopathic epilepsy and epileptic syndromes, not intractable, without status epilepticus: Secondary | ICD-10-CM | POA: Diagnosis not present

## 2017-10-24 DIAGNOSIS — G40812 Lennox-Gastaut syndrome, not intractable, without status epilepticus: Secondary | ICD-10-CM | POA: Diagnosis not present

## 2017-10-24 DIAGNOSIS — M810 Age-related osteoporosis without current pathological fracture: Secondary | ICD-10-CM | POA: Diagnosis not present

## 2017-10-24 DIAGNOSIS — Z9181 History of falling: Secondary | ICD-10-CM | POA: Diagnosis not present

## 2017-10-24 DIAGNOSIS — R2689 Other abnormalities of gait and mobility: Secondary | ICD-10-CM | POA: Diagnosis not present

## 2017-10-30 DIAGNOSIS — G40309 Generalized idiopathic epilepsy and epileptic syndromes, not intractable, without status epilepticus: Secondary | ICD-10-CM | POA: Diagnosis not present

## 2017-10-30 DIAGNOSIS — Z9181 History of falling: Secondary | ICD-10-CM | POA: Diagnosis not present

## 2017-10-30 DIAGNOSIS — R2689 Other abnormalities of gait and mobility: Secondary | ICD-10-CM | POA: Diagnosis not present

## 2017-10-30 DIAGNOSIS — G40812 Lennox-Gastaut syndrome, not intractable, without status epilepticus: Secondary | ICD-10-CM | POA: Diagnosis not present

## 2017-10-30 DIAGNOSIS — M810 Age-related osteoporosis without current pathological fracture: Secondary | ICD-10-CM | POA: Diagnosis not present

## 2017-11-01 DIAGNOSIS — R2689 Other abnormalities of gait and mobility: Secondary | ICD-10-CM | POA: Diagnosis not present

## 2017-11-01 DIAGNOSIS — G40309 Generalized idiopathic epilepsy and epileptic syndromes, not intractable, without status epilepticus: Secondary | ICD-10-CM | POA: Diagnosis not present

## 2017-11-01 DIAGNOSIS — G40812 Lennox-Gastaut syndrome, not intractable, without status epilepticus: Secondary | ICD-10-CM | POA: Diagnosis not present

## 2017-11-01 DIAGNOSIS — Z9181 History of falling: Secondary | ICD-10-CM | POA: Diagnosis not present

## 2017-11-01 DIAGNOSIS — M810 Age-related osteoporosis without current pathological fracture: Secondary | ICD-10-CM | POA: Diagnosis not present

## 2017-11-06 DIAGNOSIS — R2689 Other abnormalities of gait and mobility: Secondary | ICD-10-CM | POA: Diagnosis not present

## 2017-11-06 DIAGNOSIS — Z9181 History of falling: Secondary | ICD-10-CM | POA: Diagnosis not present

## 2017-11-06 DIAGNOSIS — G40812 Lennox-Gastaut syndrome, not intractable, without status epilepticus: Secondary | ICD-10-CM | POA: Diagnosis not present

## 2017-11-06 DIAGNOSIS — G40309 Generalized idiopathic epilepsy and epileptic syndromes, not intractable, without status epilepticus: Secondary | ICD-10-CM | POA: Diagnosis not present

## 2017-11-06 DIAGNOSIS — M810 Age-related osteoporosis without current pathological fracture: Secondary | ICD-10-CM | POA: Diagnosis not present

## 2017-11-08 DIAGNOSIS — G40309 Generalized idiopathic epilepsy and epileptic syndromes, not intractable, without status epilepticus: Secondary | ICD-10-CM | POA: Diagnosis not present

## 2017-11-08 DIAGNOSIS — Z9181 History of falling: Secondary | ICD-10-CM | POA: Diagnosis not present

## 2017-11-08 DIAGNOSIS — M810 Age-related osteoporosis without current pathological fracture: Secondary | ICD-10-CM | POA: Diagnosis not present

## 2017-11-08 DIAGNOSIS — G40812 Lennox-Gastaut syndrome, not intractable, without status epilepticus: Secondary | ICD-10-CM | POA: Diagnosis not present

## 2017-11-08 DIAGNOSIS — R2689 Other abnormalities of gait and mobility: Secondary | ICD-10-CM | POA: Diagnosis not present

## 2017-11-13 DIAGNOSIS — M810 Age-related osteoporosis without current pathological fracture: Secondary | ICD-10-CM | POA: Diagnosis not present

## 2017-11-13 DIAGNOSIS — G40309 Generalized idiopathic epilepsy and epileptic syndromes, not intractable, without status epilepticus: Secondary | ICD-10-CM | POA: Diagnosis not present

## 2017-11-13 DIAGNOSIS — R2689 Other abnormalities of gait and mobility: Secondary | ICD-10-CM | POA: Diagnosis not present

## 2017-11-13 DIAGNOSIS — G40812 Lennox-Gastaut syndrome, not intractable, without status epilepticus: Secondary | ICD-10-CM | POA: Diagnosis not present

## 2017-11-13 DIAGNOSIS — Z9181 History of falling: Secondary | ICD-10-CM | POA: Diagnosis not present

## 2017-11-15 DIAGNOSIS — M810 Age-related osteoporosis without current pathological fracture: Secondary | ICD-10-CM | POA: Diagnosis not present

## 2017-11-15 DIAGNOSIS — G40309 Generalized idiopathic epilepsy and epileptic syndromes, not intractable, without status epilepticus: Secondary | ICD-10-CM | POA: Diagnosis not present

## 2017-11-15 DIAGNOSIS — G40812 Lennox-Gastaut syndrome, not intractable, without status epilepticus: Secondary | ICD-10-CM | POA: Diagnosis not present

## 2017-11-15 DIAGNOSIS — Z9181 History of falling: Secondary | ICD-10-CM | POA: Diagnosis not present

## 2017-11-15 DIAGNOSIS — R2689 Other abnormalities of gait and mobility: Secondary | ICD-10-CM | POA: Diagnosis not present

## 2017-11-21 DIAGNOSIS — G40409 Other generalized epilepsy and epileptic syndromes, not intractable, without status epilepticus: Secondary | ICD-10-CM | POA: Diagnosis not present

## 2017-11-28 DIAGNOSIS — G3184 Mild cognitive impairment, so stated: Secondary | ICD-10-CM | POA: Diagnosis not present

## 2017-11-28 DIAGNOSIS — R2689 Other abnormalities of gait and mobility: Secondary | ICD-10-CM | POA: Diagnosis not present

## 2017-11-28 DIAGNOSIS — G40409 Other generalized epilepsy and epileptic syndromes, not intractable, without status epilepticus: Secondary | ICD-10-CM | POA: Diagnosis not present

## 2017-11-30 ENCOUNTER — Ambulatory Visit: Payer: Medicare Other | Admitting: Physical Therapy

## 2017-12-04 DIAGNOSIS — R600 Localized edema: Secondary | ICD-10-CM | POA: Diagnosis not present

## 2017-12-12 DIAGNOSIS — I340 Nonrheumatic mitral (valve) insufficiency: Secondary | ICD-10-CM | POA: Diagnosis not present

## 2017-12-12 DIAGNOSIS — R600 Localized edema: Secondary | ICD-10-CM | POA: Diagnosis not present

## 2017-12-12 DIAGNOSIS — I071 Rheumatic tricuspid insufficiency: Secondary | ICD-10-CM | POA: Diagnosis not present

## 2017-12-14 DIAGNOSIS — M810 Age-related osteoporosis without current pathological fracture: Secondary | ICD-10-CM | POA: Diagnosis not present

## 2017-12-14 DIAGNOSIS — G40309 Generalized idiopathic epilepsy and epileptic syndromes, not intractable, without status epilepticus: Secondary | ICD-10-CM | POA: Diagnosis not present

## 2017-12-14 DIAGNOSIS — G40812 Lennox-Gastaut syndrome, not intractable, without status epilepticus: Secondary | ICD-10-CM | POA: Diagnosis not present

## 2017-12-14 DIAGNOSIS — R2689 Other abnormalities of gait and mobility: Secondary | ICD-10-CM | POA: Diagnosis not present

## 2017-12-14 DIAGNOSIS — Z9181 History of falling: Secondary | ICD-10-CM | POA: Diagnosis not present

## 2018-01-03 DIAGNOSIS — Z23 Encounter for immunization: Secondary | ICD-10-CM | POA: Diagnosis not present

## 2018-01-03 DIAGNOSIS — M818 Other osteoporosis without current pathological fracture: Secondary | ICD-10-CM | POA: Diagnosis not present

## 2018-01-03 DIAGNOSIS — G40309 Generalized idiopathic epilepsy and epileptic syndromes, not intractable, without status epilepticus: Secondary | ICD-10-CM | POA: Diagnosis not present

## 2018-01-03 DIAGNOSIS — Z9189 Other specified personal risk factors, not elsewhere classified: Secondary | ICD-10-CM | POA: Diagnosis not present

## 2018-01-03 DIAGNOSIS — G40812 Lennox-Gastaut syndrome, not intractable, without status epilepticus: Secondary | ICD-10-CM | POA: Diagnosis not present

## 2018-01-07 DIAGNOSIS — G40909 Epilepsy, unspecified, not intractable, without status epilepticus: Secondary | ICD-10-CM | POA: Diagnosis not present

## 2018-01-11 DIAGNOSIS — G40219 Localization-related (focal) (partial) symptomatic epilepsy and epileptic syndromes with complex partial seizures, intractable, without status epilepticus: Secondary | ICD-10-CM | POA: Diagnosis not present

## 2018-01-11 DIAGNOSIS — G40409 Other generalized epilepsy and epileptic syndromes, not intractable, without status epilepticus: Secondary | ICD-10-CM | POA: Diagnosis not present

## 2018-01-11 DIAGNOSIS — Z79899 Other long term (current) drug therapy: Secondary | ICD-10-CM | POA: Diagnosis not present

## 2018-01-14 DIAGNOSIS — G40001 Localization-related (focal) (partial) idiopathic epilepsy and epileptic syndromes with seizures of localized onset, not intractable, with status epilepticus: Secondary | ICD-10-CM | POA: Diagnosis not present

## 2018-01-14 DIAGNOSIS — M818 Other osteoporosis without current pathological fracture: Secondary | ICD-10-CM | POA: Diagnosis not present

## 2018-02-13 DIAGNOSIS — G40814 Lennox-Gastaut syndrome, intractable, without status epilepticus: Secondary | ICD-10-CM | POA: Diagnosis not present

## 2018-02-13 DIAGNOSIS — G40301 Generalized idiopathic epilepsy and epileptic syndromes, not intractable, with status epilepticus: Secondary | ICD-10-CM | POA: Diagnosis not present

## 2018-03-22 DIAGNOSIS — G40909 Epilepsy, unspecified, not intractable, without status epilepticus: Secondary | ICD-10-CM | POA: Diagnosis not present

## 2018-03-22 DIAGNOSIS — L02412 Cutaneous abscess of left axilla: Secondary | ICD-10-CM | POA: Diagnosis not present

## 2018-03-25 DIAGNOSIS — G40409 Other generalized epilepsy and epileptic syndromes, not intractable, without status epilepticus: Secondary | ICD-10-CM | POA: Diagnosis not present

## 2018-04-15 DIAGNOSIS — G40812 Lennox-Gastaut syndrome, not intractable, without status epilepticus: Secondary | ICD-10-CM | POA: Diagnosis not present

## 2018-04-15 DIAGNOSIS — M810 Age-related osteoporosis without current pathological fracture: Secondary | ICD-10-CM | POA: Diagnosis not present

## 2018-04-15 DIAGNOSIS — R600 Localized edema: Secondary | ICD-10-CM | POA: Diagnosis not present

## 2018-04-25 DIAGNOSIS — M810 Age-related osteoporosis without current pathological fracture: Secondary | ICD-10-CM | POA: Diagnosis not present

## 2018-04-25 DIAGNOSIS — G40309 Generalized idiopathic epilepsy and epileptic syndromes, not intractable, without status epilepticus: Secondary | ICD-10-CM | POA: Diagnosis not present

## 2018-04-25 DIAGNOSIS — G40812 Lennox-Gastaut syndrome, not intractable, without status epilepticus: Secondary | ICD-10-CM | POA: Diagnosis not present

## 2018-04-30 DIAGNOSIS — M810 Age-related osteoporosis without current pathological fracture: Secondary | ICD-10-CM | POA: Diagnosis not present

## 2018-04-30 DIAGNOSIS — G40309 Generalized idiopathic epilepsy and epileptic syndromes, not intractable, without status epilepticus: Secondary | ICD-10-CM | POA: Diagnosis not present

## 2018-04-30 DIAGNOSIS — G40812 Lennox-Gastaut syndrome, not intractable, without status epilepticus: Secondary | ICD-10-CM | POA: Diagnosis not present

## 2018-05-02 DIAGNOSIS — G40309 Generalized idiopathic epilepsy and epileptic syndromes, not intractable, without status epilepticus: Secondary | ICD-10-CM | POA: Diagnosis not present

## 2018-05-02 DIAGNOSIS — M810 Age-related osteoporosis without current pathological fracture: Secondary | ICD-10-CM | POA: Diagnosis not present

## 2018-05-02 DIAGNOSIS — G40812 Lennox-Gastaut syndrome, not intractable, without status epilepticus: Secondary | ICD-10-CM | POA: Diagnosis not present

## 2018-05-07 DIAGNOSIS — G40812 Lennox-Gastaut syndrome, not intractable, without status epilepticus: Secondary | ICD-10-CM | POA: Diagnosis not present

## 2018-05-07 DIAGNOSIS — M810 Age-related osteoporosis without current pathological fracture: Secondary | ICD-10-CM | POA: Diagnosis not present

## 2018-05-07 DIAGNOSIS — G40309 Generalized idiopathic epilepsy and epileptic syndromes, not intractable, without status epilepticus: Secondary | ICD-10-CM | POA: Diagnosis not present

## 2018-05-09 DIAGNOSIS — M810 Age-related osteoporosis without current pathological fracture: Secondary | ICD-10-CM | POA: Diagnosis not present

## 2018-05-09 DIAGNOSIS — G40812 Lennox-Gastaut syndrome, not intractable, without status epilepticus: Secondary | ICD-10-CM | POA: Diagnosis not present

## 2018-05-09 DIAGNOSIS — G40309 Generalized idiopathic epilepsy and epileptic syndromes, not intractable, without status epilepticus: Secondary | ICD-10-CM | POA: Diagnosis not present

## 2018-05-14 DIAGNOSIS — G40812 Lennox-Gastaut syndrome, not intractable, without status epilepticus: Secondary | ICD-10-CM | POA: Diagnosis not present

## 2018-05-14 DIAGNOSIS — M810 Age-related osteoporosis without current pathological fracture: Secondary | ICD-10-CM | POA: Diagnosis not present

## 2018-05-14 DIAGNOSIS — G40309 Generalized idiopathic epilepsy and epileptic syndromes, not intractable, without status epilepticus: Secondary | ICD-10-CM | POA: Diagnosis not present

## 2018-05-16 DIAGNOSIS — G40812 Lennox-Gastaut syndrome, not intractable, without status epilepticus: Secondary | ICD-10-CM | POA: Diagnosis not present

## 2018-05-16 DIAGNOSIS — M810 Age-related osteoporosis without current pathological fracture: Secondary | ICD-10-CM | POA: Diagnosis not present

## 2018-05-16 DIAGNOSIS — G40309 Generalized idiopathic epilepsy and epileptic syndromes, not intractable, without status epilepticus: Secondary | ICD-10-CM | POA: Diagnosis not present

## 2018-05-25 DIAGNOSIS — G40309 Generalized idiopathic epilepsy and epileptic syndromes, not intractable, without status epilepticus: Secondary | ICD-10-CM | POA: Diagnosis not present

## 2018-05-25 DIAGNOSIS — M810 Age-related osteoporosis without current pathological fracture: Secondary | ICD-10-CM | POA: Diagnosis not present

## 2018-05-25 DIAGNOSIS — G40812 Lennox-Gastaut syndrome, not intractable, without status epilepticus: Secondary | ICD-10-CM | POA: Diagnosis not present

## 2018-05-28 DIAGNOSIS — M810 Age-related osteoporosis without current pathological fracture: Secondary | ICD-10-CM | POA: Diagnosis not present

## 2018-05-28 DIAGNOSIS — G40309 Generalized idiopathic epilepsy and epileptic syndromes, not intractable, without status epilepticus: Secondary | ICD-10-CM | POA: Diagnosis not present

## 2018-05-28 DIAGNOSIS — G40812 Lennox-Gastaut syndrome, not intractable, without status epilepticus: Secondary | ICD-10-CM | POA: Diagnosis not present

## 2018-05-30 DIAGNOSIS — G40309 Generalized idiopathic epilepsy and epileptic syndromes, not intractable, without status epilepticus: Secondary | ICD-10-CM | POA: Diagnosis not present

## 2018-05-30 DIAGNOSIS — M810 Age-related osteoporosis without current pathological fracture: Secondary | ICD-10-CM | POA: Diagnosis not present

## 2018-05-30 DIAGNOSIS — G40812 Lennox-Gastaut syndrome, not intractable, without status epilepticus: Secondary | ICD-10-CM | POA: Diagnosis not present

## 2018-06-04 DIAGNOSIS — G40309 Generalized idiopathic epilepsy and epileptic syndromes, not intractable, without status epilepticus: Secondary | ICD-10-CM | POA: Diagnosis not present

## 2018-06-04 DIAGNOSIS — G40812 Lennox-Gastaut syndrome, not intractable, without status epilepticus: Secondary | ICD-10-CM | POA: Diagnosis not present

## 2018-06-04 DIAGNOSIS — M810 Age-related osteoporosis without current pathological fracture: Secondary | ICD-10-CM | POA: Diagnosis not present

## 2018-06-10 DIAGNOSIS — M810 Age-related osteoporosis without current pathological fracture: Secondary | ICD-10-CM | POA: Diagnosis not present

## 2018-06-10 DIAGNOSIS — G40812 Lennox-Gastaut syndrome, not intractable, without status epilepticus: Secondary | ICD-10-CM | POA: Diagnosis not present

## 2018-06-10 DIAGNOSIS — G40309 Generalized idiopathic epilepsy and epileptic syndromes, not intractable, without status epilepticus: Secondary | ICD-10-CM | POA: Diagnosis not present

## 2018-06-12 DIAGNOSIS — G40812 Lennox-Gastaut syndrome, not intractable, without status epilepticus: Secondary | ICD-10-CM | POA: Diagnosis not present

## 2018-06-12 DIAGNOSIS — M810 Age-related osteoporosis without current pathological fracture: Secondary | ICD-10-CM | POA: Diagnosis not present

## 2018-06-12 DIAGNOSIS — G40309 Generalized idiopathic epilepsy and epileptic syndromes, not intractable, without status epilepticus: Secondary | ICD-10-CM | POA: Diagnosis not present

## 2018-06-14 DIAGNOSIS — G40812 Lennox-Gastaut syndrome, not intractable, without status epilepticus: Secondary | ICD-10-CM | POA: Diagnosis not present

## 2018-06-14 DIAGNOSIS — G40309 Generalized idiopathic epilepsy and epileptic syndromes, not intractable, without status epilepticus: Secondary | ICD-10-CM | POA: Diagnosis not present

## 2018-06-14 DIAGNOSIS — M810 Age-related osteoporosis without current pathological fracture: Secondary | ICD-10-CM | POA: Diagnosis not present

## 2018-06-19 DIAGNOSIS — G40309 Generalized idiopathic epilepsy and epileptic syndromes, not intractable, without status epilepticus: Secondary | ICD-10-CM | POA: Diagnosis not present

## 2018-06-19 DIAGNOSIS — G40812 Lennox-Gastaut syndrome, not intractable, without status epilepticus: Secondary | ICD-10-CM | POA: Diagnosis not present

## 2018-06-19 DIAGNOSIS — M810 Age-related osteoporosis without current pathological fracture: Secondary | ICD-10-CM | POA: Diagnosis not present

## 2018-06-21 DIAGNOSIS — G40812 Lennox-Gastaut syndrome, not intractable, without status epilepticus: Secondary | ICD-10-CM | POA: Diagnosis not present

## 2018-06-21 DIAGNOSIS — G40309 Generalized idiopathic epilepsy and epileptic syndromes, not intractable, without status epilepticus: Secondary | ICD-10-CM | POA: Diagnosis not present

## 2018-06-21 DIAGNOSIS — M810 Age-related osteoporosis without current pathological fracture: Secondary | ICD-10-CM | POA: Diagnosis not present

## 2018-06-24 DIAGNOSIS — Z0001 Encounter for general adult medical examination with abnormal findings: Secondary | ICD-10-CM | POA: Diagnosis not present

## 2018-06-24 DIAGNOSIS — Z9181 History of falling: Secondary | ICD-10-CM | POA: Diagnosis not present

## 2018-08-01 DIAGNOSIS — G40219 Localization-related (focal) (partial) symptomatic epilepsy and epileptic syndromes with complex partial seizures, intractable, without status epilepticus: Secondary | ICD-10-CM | POA: Diagnosis not present

## 2018-08-01 DIAGNOSIS — F70 Mild intellectual disabilities: Secondary | ICD-10-CM | POA: Diagnosis not present

## 2018-08-15 DIAGNOSIS — M810 Age-related osteoporosis without current pathological fracture: Secondary | ICD-10-CM | POA: Diagnosis not present

## 2018-09-14 DIAGNOSIS — G40309 Generalized idiopathic epilepsy and epileptic syndromes, not intractable, without status epilepticus: Secondary | ICD-10-CM | POA: Diagnosis not present

## 2018-09-14 DIAGNOSIS — M810 Age-related osteoporosis without current pathological fracture: Secondary | ICD-10-CM | POA: Diagnosis not present

## 2018-09-18 DIAGNOSIS — Z5181 Encounter for therapeutic drug level monitoring: Secondary | ICD-10-CM | POA: Diagnosis not present

## 2018-09-18 DIAGNOSIS — Z79899 Other long term (current) drug therapy: Secondary | ICD-10-CM | POA: Diagnosis not present

## 2018-09-18 DIAGNOSIS — G40409 Other generalized epilepsy and epileptic syndromes, not intractable, without status epilepticus: Secondary | ICD-10-CM | POA: Diagnosis not present

## 2018-10-01 DIAGNOSIS — Z6821 Body mass index (BMI) 21.0-21.9, adult: Secondary | ICD-10-CM | POA: Diagnosis not present

## 2018-10-01 DIAGNOSIS — N3090 Cystitis, unspecified without hematuria: Secondary | ICD-10-CM | POA: Diagnosis not present

## 2018-10-01 DIAGNOSIS — R350 Frequency of micturition: Secondary | ICD-10-CM | POA: Diagnosis not present

## 2018-10-30 DIAGNOSIS — Z6820 Body mass index (BMI) 20.0-20.9, adult: Secondary | ICD-10-CM | POA: Diagnosis not present

## 2018-10-30 DIAGNOSIS — R300 Dysuria: Secondary | ICD-10-CM | POA: Diagnosis not present

## 2018-10-30 DIAGNOSIS — N3090 Cystitis, unspecified without hematuria: Secondary | ICD-10-CM | POA: Diagnosis not present

## 2018-11-07 DIAGNOSIS — R300 Dysuria: Secondary | ICD-10-CM | POA: Diagnosis not present

## 2018-11-14 DIAGNOSIS — M545 Low back pain: Secondary | ICD-10-CM | POA: Diagnosis not present

## 2018-11-14 DIAGNOSIS — Z6820 Body mass index (BMI) 20.0-20.9, adult: Secondary | ICD-10-CM | POA: Diagnosis not present

## 2018-11-14 DIAGNOSIS — R300 Dysuria: Secondary | ICD-10-CM | POA: Diagnosis not present

## 2018-11-15 DIAGNOSIS — G40812 Lennox-Gastaut syndrome, not intractable, without status epilepticus: Secondary | ICD-10-CM | POA: Diagnosis not present

## 2018-11-15 DIAGNOSIS — G40309 Generalized idiopathic epilepsy and epileptic syndromes, not intractable, without status epilepticus: Secondary | ICD-10-CM | POA: Diagnosis not present

## 2018-12-16 DIAGNOSIS — M818 Other osteoporosis without current pathological fracture: Secondary | ICD-10-CM | POA: Diagnosis not present

## 2018-12-16 DIAGNOSIS — G40301 Generalized idiopathic epilepsy and epileptic syndromes, not intractable, with status epilepticus: Secondary | ICD-10-CM | POA: Diagnosis not present

## 2018-12-17 DIAGNOSIS — Z6820 Body mass index (BMI) 20.0-20.9, adult: Secondary | ICD-10-CM | POA: Diagnosis not present

## 2018-12-17 DIAGNOSIS — R300 Dysuria: Secondary | ICD-10-CM | POA: Diagnosis not present

## 2018-12-17 DIAGNOSIS — N3090 Cystitis, unspecified without hematuria: Secondary | ICD-10-CM | POA: Diagnosis not present

## 2018-12-20 DIAGNOSIS — N390 Urinary tract infection, site not specified: Secondary | ICD-10-CM | POA: Diagnosis not present

## 2018-12-20 DIAGNOSIS — N3090 Cystitis, unspecified without hematuria: Secondary | ICD-10-CM | POA: Diagnosis not present

## 2018-12-20 DIAGNOSIS — R102 Pelvic and perineal pain: Secondary | ICD-10-CM | POA: Diagnosis not present

## 2018-12-20 DIAGNOSIS — R300 Dysuria: Secondary | ICD-10-CM | POA: Diagnosis not present

## 2018-12-24 DIAGNOSIS — G40309 Generalized idiopathic epilepsy and epileptic syndromes, not intractable, without status epilepticus: Secondary | ICD-10-CM | POA: Diagnosis not present

## 2018-12-24 DIAGNOSIS — G40812 Lennox-Gastaut syndrome, not intractable, without status epilepticus: Secondary | ICD-10-CM | POA: Diagnosis not present

## 2018-12-24 DIAGNOSIS — M818 Other osteoporosis without current pathological fracture: Secondary | ICD-10-CM | POA: Diagnosis not present

## 2018-12-24 DIAGNOSIS — Z23 Encounter for immunization: Secondary | ICD-10-CM | POA: Diagnosis not present

## 2019-02-10 DIAGNOSIS — G40834 Dravet syndrome, intractable, without status epilepticus: Secondary | ICD-10-CM | POA: Diagnosis not present

## 2019-02-14 DIAGNOSIS — G40811 Lennox-Gastaut syndrome, not intractable, with status epilepticus: Secondary | ICD-10-CM | POA: Diagnosis not present

## 2019-02-14 DIAGNOSIS — G40301 Generalized idiopathic epilepsy and epileptic syndromes, not intractable, with status epilepticus: Secondary | ICD-10-CM | POA: Diagnosis not present

## 2019-02-19 DIAGNOSIS — G40834 Dravet syndrome, intractable, without status epilepticus: Secondary | ICD-10-CM | POA: Diagnosis not present

## 2019-02-20 DIAGNOSIS — G40834 Dravet syndrome, intractable, without status epilepticus: Secondary | ICD-10-CM | POA: Diagnosis not present

## 2019-02-28 DIAGNOSIS — S82852A Displaced trimalleolar fracture of left lower leg, initial encounter for closed fracture: Secondary | ICD-10-CM | POA: Diagnosis not present

## 2019-02-28 DIAGNOSIS — N3000 Acute cystitis without hematuria: Secondary | ICD-10-CM | POA: Insufficient documentation

## 2019-02-28 DIAGNOSIS — N760 Acute vaginitis: Secondary | ICD-10-CM | POA: Insufficient documentation

## 2019-02-28 DIAGNOSIS — N3090 Cystitis, unspecified without hematuria: Secondary | ICD-10-CM | POA: Diagnosis not present

## 2019-02-28 DIAGNOSIS — N951 Menopausal and female climacteric states: Secondary | ICD-10-CM | POA: Insufficient documentation

## 2019-02-28 DIAGNOSIS — F79 Unspecified intellectual disabilities: Secondary | ICD-10-CM | POA: Insufficient documentation

## 2019-02-28 DIAGNOSIS — N926 Irregular menstruation, unspecified: Secondary | ICD-10-CM | POA: Insufficient documentation

## 2019-02-28 DIAGNOSIS — M79672 Pain in left foot: Secondary | ICD-10-CM | POA: Diagnosis not present

## 2019-03-03 ENCOUNTER — Encounter: Payer: Self-pay | Admitting: Orthopaedic Surgery

## 2019-03-03 ENCOUNTER — Ambulatory Visit (INDEPENDENT_AMBULATORY_CARE_PROVIDER_SITE_OTHER): Payer: Medicare Other | Admitting: Orthopaedic Surgery

## 2019-03-03 ENCOUNTER — Ambulatory Visit (INDEPENDENT_AMBULATORY_CARE_PROVIDER_SITE_OTHER): Payer: Medicare Other

## 2019-03-03 DIAGNOSIS — M25572 Pain in left ankle and joints of left foot: Secondary | ICD-10-CM

## 2019-03-03 DIAGNOSIS — S82842A Displaced bimalleolar fracture of left lower leg, initial encounter for closed fracture: Secondary | ICD-10-CM

## 2019-03-03 NOTE — Progress Notes (Signed)
Office Visit Note   Patient: Molly Duffy           Date of Birth: 1971/03/22           MRN: [id number] Visit Date: 03/03/2019              Requested by: Caryl Bis, MD [place],  [place] [id number] PCP: Caryl Bis, MD   Assessment & Plan: Visit Diagnoses:  1. Pain in left ankle and joints of left foot   2. Bimalleolar ankle fracture, left, closed, initial encounter     Plan: We are going to try to treat this nonoperatively with placing her in a well-padded splint with her foot at 90 degrees today.  We would then see her back in 1 week for splint removal and repeat 3 views of her left ankle.  We will likely put her in a nonweightbearing short leg cast at that standpoint.  We will see if home health can come out for PT and OT with activities daily living and nonweightbearing on the left lower extremity.  All questions and concerns were answered and addressed and the family does agree with this treatment plan.  Follow-Up Instructions: Return in about 1 week (around 03/10/2019).   Orders:  Orders Placed This Encounter  Procedures   XR Ankle Complete Left   No orders of the defined types were placed in this encounter.     Procedures: No procedures performed   Clinical Data: No additional findings.   Subjective: Chief Complaint  Patient presents with   Left Ankle - Pain, Injury  The patient is a very pleasant 48 year old female with seizure disorder who sustained a mechanical fall 3 days ago injuring her left ankle.  Difficulty ambulating and pain as well as ankle swelling they went to a local urgent care.  X-rays were obtained that showed an ankle fracture.  She was placed in just a cam walking boot and they are following up with [place] today for further action and treatment of this injury.  She is able to communicate with her parents.  She does have a significant seizure history and so were trying to avoid operative treatment for what is described as a  bimalleolar ankle fracture.  She does not tolerate anesthesia nor is she tolerating type of pain medications at all.  Her seizures are significant.  She wants to walk but has held off on this due to ankle pain but her pain is decreasing somewhat and she wants to try to walk according to family.  HPI  Review of Systems There is no fever, chills, nausea, vomiting.  Objective: Vital Signs: There were no vitals taken for this visit.  Physical Exam The patient is alert but there is obvious communication issues given her seizure disorder and syndrome.  She does communicate with family.  She does show intolerance and guarding with pain. Ortho Exam Examination of her left ankle does show global swelling with bruising.  Clinically the ankle is well located.  Her foot is well-perfused with palpable pulse. Specialty Comments:  No specialty comments available.  Imaging: Xr Ankle Complete Left  Result Date: 03/03/2019 3 views of the left ankle show a bimalleolar ankle fracture.  There may be a small lip of the posterior malleolus but the lateral malleolus is nondisplaced and the medial malleolus is mildly displaced.  The mortise is congruent.    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 No pertinent family history.  Past Surgical History:  Procedure Laterality Date   dental implants     vns implant  2001   has been turned off   Social History   Occupational History   Not on file  Tobacco Use   Smoking status: Never Smoker   Smokeless tobacco: Never Used  Substance and Sexual Activity   Alcohol use: No   Drug use: Not on file   Sexual activity: Not on file

## 2019-03-05 ENCOUNTER — Telehealth: Payer: Self-pay | Admitting: Orthopaedic Surgery

## 2019-03-05 NOTE — Telephone Encounter (Signed)
Molly Duffy original copy.

## 2019-03-05 NOTE — Telephone Encounter (Signed)
Received voicemail message from patient's mother Jackelyn Poling. Debbie asked if patient can get Home Health assistance? Debbie said patient is special needs and they are afraid of falling trying to move her. Debbie asked if a Rx can be written for a lift chair. The number to contact Jackelyn Poling is [phone]

## 2019-03-05 NOTE — Telephone Encounter (Signed)
Please order these things (see note). Thanks!

## 2019-03-05 NOTE — Telephone Encounter (Signed)
Faxed all to [facility]

## 2019-03-05 NOTE — Telephone Encounter (Signed)
Patient's mother called back and is needing a Hoyer lift to get her in her wheelchair and also needs a portable toilet so that they can get her to the bathroom from the wheelchair.  Mother is willing to pay for it until the insurance will pay for it.  She is stating that the insurance is going to take 4 to 6 days to get it.  CB#[phone].  Thank you.

## 2019-03-05 NOTE — Telephone Encounter (Signed)
Please advise 

## 2019-03-10 ENCOUNTER — Telehealth: Payer: Self-pay | Admitting: Orthopaedic Surgery

## 2019-03-10 ENCOUNTER — Ambulatory Visit (INDEPENDENT_AMBULATORY_CARE_PROVIDER_SITE_OTHER): Payer: Medicare Other | Admitting: Orthopaedic Surgery

## 2019-03-10 ENCOUNTER — Encounter: Payer: Self-pay | Admitting: Orthopaedic Surgery

## 2019-03-10 ENCOUNTER — Ambulatory Visit (INDEPENDENT_AMBULATORY_CARE_PROVIDER_SITE_OTHER): Payer: Medicare Other

## 2019-03-10 DIAGNOSIS — S82842A Displaced bimalleolar fracture of left lower leg, initial encounter for closed fracture: Secondary | ICD-10-CM

## 2019-03-10 DIAGNOSIS — S82842D Displaced bimalleolar fracture of left lower leg, subsequent encounter for closed fracture with routine healing: Secondary | ICD-10-CM

## 2019-03-10 NOTE — Telephone Encounter (Signed)
Anything to add to her therapy?

## 2019-03-10 NOTE — Telephone Encounter (Signed)
There is nothing to really add to therapy from our standpoint.  She will be nonweightbearing on that left ankle until further notice.

## 2019-03-10 NOTE — Telephone Encounter (Signed)
Santiago Glad with [facility] request a call back @ [phone] Regarding pt's PT/OT

## 2019-03-10 NOTE — Progress Notes (Signed)
The patient is a 48 year old female with seizure disorder who is seen in follow-up with a bimalleolar ankle fracture left ankle.  We saw her a week ago and placed in a splint.  This is a follow-up today.  Her parents are with her and are very involved with her care.  She has been nonweightbearing on that left side.  On examination there is still significant swelling of her foot but the bruising is diminished of the ankle.  3 views of the left ankle show that the bimalleolar ankle fracture is still nondisplaced and the ankle mortise is well-maintained.  She will be placed in a short leg nonweightbearing cast today that is well-padded with the foot at 90 degrees.  We will see her back in 3 weeks to have the cast removed and a repeat 3 views of her left ankle.  All question concerns were answered and addressed.

## 2019-03-11 DIAGNOSIS — Z9181 History of falling: Secondary | ICD-10-CM | POA: Diagnosis not present

## 2019-03-11 DIAGNOSIS — S82842D Displaced bimalleolar fracture of left lower leg, subsequent encounter for closed fracture with routine healing: Secondary | ICD-10-CM | POA: Diagnosis not present

## 2019-03-11 DIAGNOSIS — G40812 Lennox-Gastaut syndrome, not intractable, without status epilepticus: Secondary | ICD-10-CM | POA: Diagnosis not present

## 2019-03-11 DIAGNOSIS — G40309 Generalized idiopathic epilepsy and epileptic syndromes, not intractable, without status epilepticus: Secondary | ICD-10-CM | POA: Diagnosis not present

## 2019-03-11 DIAGNOSIS — M810 Age-related osteoporosis without current pathological fracture: Secondary | ICD-10-CM | POA: Diagnosis not present

## 2019-03-12 DIAGNOSIS — M810 Age-related osteoporosis without current pathological fracture: Secondary | ICD-10-CM | POA: Diagnosis not present

## 2019-03-12 DIAGNOSIS — S82842D Displaced bimalleolar fracture of left lower leg, subsequent encounter for closed fracture with routine healing: Secondary | ICD-10-CM | POA: Diagnosis not present

## 2019-03-12 DIAGNOSIS — Z9181 History of falling: Secondary | ICD-10-CM | POA: Diagnosis not present

## 2019-03-12 DIAGNOSIS — G40812 Lennox-Gastaut syndrome, not intractable, without status epilepticus: Secondary | ICD-10-CM | POA: Diagnosis not present

## 2019-03-12 DIAGNOSIS — G40309 Generalized idiopathic epilepsy and epileptic syndromes, not intractable, without status epilepticus: Secondary | ICD-10-CM | POA: Diagnosis not present

## 2019-03-24 DIAGNOSIS — Z9181 History of falling: Secondary | ICD-10-CM | POA: Diagnosis not present

## 2019-03-24 DIAGNOSIS — G40309 Generalized idiopathic epilepsy and epileptic syndromes, not intractable, without status epilepticus: Secondary | ICD-10-CM | POA: Diagnosis not present

## 2019-03-24 DIAGNOSIS — G40812 Lennox-Gastaut syndrome, not intractable, without status epilepticus: Secondary | ICD-10-CM | POA: Diagnosis not present

## 2019-03-24 DIAGNOSIS — M810 Age-related osteoporosis without current pathological fracture: Secondary | ICD-10-CM | POA: Diagnosis not present

## 2019-03-24 DIAGNOSIS — S82842D Displaced bimalleolar fracture of left lower leg, subsequent encounter for closed fracture with routine healing: Secondary | ICD-10-CM | POA: Diagnosis not present

## 2019-03-31 DIAGNOSIS — R350 Frequency of micturition: Secondary | ICD-10-CM | POA: Diagnosis not present

## 2019-03-31 DIAGNOSIS — N3090 Cystitis, unspecified without hematuria: Secondary | ICD-10-CM | POA: Diagnosis not present

## 2019-04-02 ENCOUNTER — Other Ambulatory Visit: Payer: Self-pay

## 2019-04-02 ENCOUNTER — Encounter: Payer: Self-pay | Admitting: Orthopaedic Surgery

## 2019-04-02 ENCOUNTER — Ambulatory Visit (INDEPENDENT_AMBULATORY_CARE_PROVIDER_SITE_OTHER): Payer: Medicare Other | Admitting: Orthopaedic Surgery

## 2019-04-02 ENCOUNTER — Ambulatory Visit (INDEPENDENT_AMBULATORY_CARE_PROVIDER_SITE_OTHER): Payer: Medicare Other

## 2019-04-02 DIAGNOSIS — S82842D Displaced bimalleolar fracture of left lower leg, subsequent encounter for closed fracture with routine healing: Secondary | ICD-10-CM

## 2019-04-02 DIAGNOSIS — S82842A Displaced bimalleolar fracture of left lower leg, initial encounter for closed fracture: Secondary | ICD-10-CM

## 2019-04-02 NOTE — Progress Notes (Signed)
The patient is now about 5 weeks out from a bimalleolar ankle fracture.  She is 48 years old with a severe seizure disorder.  We set her in a short leg cast.  Her parents are very involved with her care.  She has been nonweightbearing in the cast.  The cast is in excellent shape.  Remove the cast today she still has ankle swelling on the left side but is reduced dramatically from when I seen her before.  I can flex and extend her ankle and she does not seem to be in a lot of pain.  3 views left ankle obtained and the fracture lines are still visible of the lateral medial malleolus.  There has been interval healing and the mortise is intact.  This overall appears stable.  I can have her in a cam walking boot at this point.  We will still limit some of the weightbearing and let her put weight on the ankle to go to the bathroom or to stand occasionally.  She will still sleep in the boot since she does have a seizure disorder and tends to try to get up.  She can take the boot off for hygiene purposes and to gently work on motion of the ankle.  I would like to see her back in 4 weeks with a repeat 3 views of the left ankle.  All question concerns were answered and addressed.

## 2019-04-04 ENCOUNTER — Telehealth: Payer: Self-pay | Admitting: Orthopaedic Surgery

## 2019-04-04 NOTE — Telephone Encounter (Signed)
Molly Duffy, physical therapist called, is needing to know WBS. Callback [phone]

## 2019-04-04 NOTE — Telephone Encounter (Signed)
I left voicemail for Molly Duffy advising per last office note, patient will still have limited weightbearing. She may stand occasionally or bear weight to go to the bathroom. Advised patient to continue CAM boot.

## 2019-04-10 DIAGNOSIS — G40309 Generalized idiopathic epilepsy and epileptic syndromes, not intractable, without status epilepticus: Secondary | ICD-10-CM | POA: Diagnosis not present

## 2019-04-10 DIAGNOSIS — M810 Age-related osteoporosis without current pathological fracture: Secondary | ICD-10-CM | POA: Diagnosis not present

## 2019-04-10 DIAGNOSIS — Z9181 History of falling: Secondary | ICD-10-CM | POA: Diagnosis not present

## 2019-04-10 DIAGNOSIS — S82842D Displaced bimalleolar fracture of left lower leg, subsequent encounter for closed fracture with routine healing: Secondary | ICD-10-CM | POA: Diagnosis not present

## 2019-04-10 DIAGNOSIS — G40812 Lennox-Gastaut syndrome, not intractable, without status epilepticus: Secondary | ICD-10-CM | POA: Diagnosis not present

## 2019-04-30 ENCOUNTER — Ambulatory Visit (INDEPENDENT_AMBULATORY_CARE_PROVIDER_SITE_OTHER): Payer: Medicare Other

## 2019-04-30 ENCOUNTER — Encounter: Payer: Self-pay | Admitting: Orthopaedic Surgery

## 2019-04-30 ENCOUNTER — Other Ambulatory Visit: Payer: Self-pay

## 2019-04-30 ENCOUNTER — Ambulatory Visit (INDEPENDENT_AMBULATORY_CARE_PROVIDER_SITE_OTHER): Payer: Medicare Other | Admitting: Orthopaedic Surgery

## 2019-04-30 DIAGNOSIS — S82842D Displaced bimalleolar fracture of left lower leg, subsequent encounter for closed fracture with routine healing: Secondary | ICD-10-CM

## 2019-04-30 DIAGNOSIS — S82842A Displaced bimalleolar fracture of left lower leg, initial encounter for closed fracture: Secondary | ICD-10-CM

## 2019-04-30 NOTE — Progress Notes (Signed)
The patient is a very pleasant 49 year old who is now 8 weeks into a bimalleolar ankle fracture that were treated nonoperative due to her severe seizure disorder and other comorbidities.  She has been adherent to wearing her walking boot.  Her parents are with her and are incredibly involved with her care.  They said she is doing well.  We did obtain new x-rays today.  The fibula fracture is almost healed completely.  The medial malleolus fracture is still healing.  The ankle mortise is aligned and the ankle is well located.  Again continue to treat this nonoperative given her comorbidities.  We will see her back in 4 weeks for repeat 3 views of her left ankle.  At that point we can potentially have her out of the boot and trying an ASO.  All question concerns were answered addressed.

## 2019-05-08 DIAGNOSIS — Z9181 History of falling: Secondary | ICD-10-CM | POA: Diagnosis not present

## 2019-05-08 DIAGNOSIS — S82842D Displaced bimalleolar fracture of left lower leg, subsequent encounter for closed fracture with routine healing: Secondary | ICD-10-CM | POA: Diagnosis not present

## 2019-05-08 DIAGNOSIS — M810 Age-related osteoporosis without current pathological fracture: Secondary | ICD-10-CM | POA: Diagnosis not present

## 2019-05-08 DIAGNOSIS — G40309 Generalized idiopathic epilepsy and epileptic syndromes, not intractable, without status epilepticus: Secondary | ICD-10-CM | POA: Diagnosis not present

## 2019-05-08 DIAGNOSIS — G40812 Lennox-Gastaut syndrome, not intractable, without status epilepticus: Secondary | ICD-10-CM | POA: Diagnosis not present

## 2019-05-09 ENCOUNTER — Telehealth: Payer: Self-pay

## 2019-05-09 NOTE — Telephone Encounter (Signed)
Molly Duffy, PT with [facility] home health would like clarification on WB status and would like to know if patient is able to do ROM, left ankle?  CB# [phone].  Please advise.  Thank you.

## 2019-05-09 NOTE — Telephone Encounter (Signed)
Please advise 

## 2019-05-09 NOTE — Telephone Encounter (Signed)
She can attempt full weight bearing as tolerated on her ankle in her boot.  They can work on ankle motion.

## 2019-05-09 NOTE — Telephone Encounter (Signed)
Verbal order given  

## 2019-05-10 DIAGNOSIS — S82842D Displaced bimalleolar fracture of left lower leg, subsequent encounter for closed fracture with routine healing: Secondary | ICD-10-CM | POA: Diagnosis not present

## 2019-05-10 DIAGNOSIS — G40309 Generalized idiopathic epilepsy and epileptic syndromes, not intractable, without status epilepticus: Secondary | ICD-10-CM | POA: Diagnosis not present

## 2019-05-10 DIAGNOSIS — Z9181 History of falling: Secondary | ICD-10-CM | POA: Diagnosis not present

## 2019-05-10 DIAGNOSIS — M810 Age-related osteoporosis without current pathological fracture: Secondary | ICD-10-CM | POA: Diagnosis not present

## 2019-05-10 DIAGNOSIS — G40812 Lennox-Gastaut syndrome, not intractable, without status epilepticus: Secondary | ICD-10-CM | POA: Diagnosis not present

## 2019-05-13 ENCOUNTER — Telehealth: Payer: Self-pay

## 2019-05-13 NOTE — Telephone Encounter (Signed)
At least call the patient's mother and let her know that I am fine with him advancing her to full weightbearing as tolerated in her boot if she is comfortable doing that.  I am also fine with physical therapy working with her if they would like this as well.

## 2019-05-13 NOTE — Telephone Encounter (Signed)
Kim with [facility] in [place] called stating that no PT has been done for patient.  Stated that patient's mother refused due to cognitive ability of patient and patient is 25% partial WB. Would like a call back to discuss?  Cb# [phone].  Please advise.  Thank you.

## 2019-05-14 NOTE — Telephone Encounter (Signed)
Order given to Selena Batten and patient mom aware aswell

## 2019-05-16 DIAGNOSIS — G40812 Lennox-Gastaut syndrome, not intractable, without status epilepticus: Secondary | ICD-10-CM | POA: Diagnosis not present

## 2019-05-16 DIAGNOSIS — G40309 Generalized idiopathic epilepsy and epileptic syndromes, not intractable, without status epilepticus: Secondary | ICD-10-CM | POA: Diagnosis not present

## 2019-05-20 DIAGNOSIS — G40309 Generalized idiopathic epilepsy and epileptic syndromes, not intractable, without status epilepticus: Secondary | ICD-10-CM | POA: Diagnosis not present

## 2019-05-20 DIAGNOSIS — Z9181 History of falling: Secondary | ICD-10-CM | POA: Diagnosis not present

## 2019-05-20 DIAGNOSIS — S82842D Displaced bimalleolar fracture of left lower leg, subsequent encounter for closed fracture with routine healing: Secondary | ICD-10-CM | POA: Diagnosis not present

## 2019-05-20 DIAGNOSIS — G40812 Lennox-Gastaut syndrome, not intractable, without status epilepticus: Secondary | ICD-10-CM | POA: Diagnosis not present

## 2019-05-20 DIAGNOSIS — M810 Age-related osteoporosis without current pathological fracture: Secondary | ICD-10-CM | POA: Diagnosis not present

## 2019-05-24 DIAGNOSIS — Z9181 History of falling: Secondary | ICD-10-CM | POA: Diagnosis not present

## 2019-05-24 DIAGNOSIS — S82842D Displaced bimalleolar fracture of left lower leg, subsequent encounter for closed fracture with routine healing: Secondary | ICD-10-CM | POA: Diagnosis not present

## 2019-05-24 DIAGNOSIS — M810 Age-related osteoporosis without current pathological fracture: Secondary | ICD-10-CM | POA: Diagnosis not present

## 2019-05-24 DIAGNOSIS — G40309 Generalized idiopathic epilepsy and epileptic syndromes, not intractable, without status epilepticus: Secondary | ICD-10-CM | POA: Diagnosis not present

## 2019-05-24 DIAGNOSIS — G40812 Lennox-Gastaut syndrome, not intractable, without status epilepticus: Secondary | ICD-10-CM | POA: Diagnosis not present

## 2019-05-28 ENCOUNTER — Other Ambulatory Visit: Payer: Self-pay

## 2019-05-28 ENCOUNTER — Encounter: Payer: Self-pay | Admitting: Orthopaedic Surgery

## 2019-05-28 ENCOUNTER — Ambulatory Visit (INDEPENDENT_AMBULATORY_CARE_PROVIDER_SITE_OTHER): Payer: Medicare Other

## 2019-05-28 ENCOUNTER — Ambulatory Visit (INDEPENDENT_AMBULATORY_CARE_PROVIDER_SITE_OTHER): Payer: Medicare Other | Admitting: Orthopaedic Surgery

## 2019-05-28 DIAGNOSIS — S82842D Displaced bimalleolar fracture of left lower leg, subsequent encounter for closed fracture with routine healing: Secondary | ICD-10-CM

## 2019-05-28 NOTE — Progress Notes (Signed)
Molly Duffy is now getting close to 3 months status post a bimalleolar ankle fracture.  We are treating this nonoperative given her seizure disorder which is quite severe.  She has been ambulating with weightbearing as tolerated in a cam walking boot.  Her family is with her today and they are very involved with her care and supportive of her.  They have pointed out that her thigh seems to be bigger on that left side and they have not noticed that before.  She has been still experiencing swelling in the ankle but seems to be overall making improvements with her mobility.  On examination out of her cam walking boot there is still moderate ankle swelling but she is tolerating motion of the ankle better with just some mild pain.  The ankle is ligamentously stable.  Examination of her left knee shows no knee joint effusion.  I agree that her thigh is just a little bit bigger on the left side than the right side and I think this may be just from the edema aspects of dealing with an injury.  3 views of the left ankle are obtained and show significant interval healing of her fracture.  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  At this point we will transition her to an ASO for her left ankle.  If she is not comfortable in this she can still continue the cam walking boot.  We will see them back for a final visit in 4 weeks from now.  We can obtain a final 3 views of her left ankle at that visit.  All questions and concerns were answered and addressed.

## 2019-05-30 DIAGNOSIS — M810 Age-related osteoporosis without current pathological fracture: Secondary | ICD-10-CM | POA: Diagnosis not present

## 2019-05-30 DIAGNOSIS — S82842D Displaced bimalleolar fracture of left lower leg, subsequent encounter for closed fracture with routine healing: Secondary | ICD-10-CM | POA: Diagnosis not present

## 2019-05-30 DIAGNOSIS — G40309 Generalized idiopathic epilepsy and epileptic syndromes, not intractable, without status epilepticus: Secondary | ICD-10-CM | POA: Diagnosis not present

## 2019-05-30 DIAGNOSIS — G40812 Lennox-Gastaut syndrome, not intractable, without status epilepticus: Secondary | ICD-10-CM | POA: Diagnosis not present

## 2019-05-30 DIAGNOSIS — Z9181 History of falling: Secondary | ICD-10-CM | POA: Diagnosis not present

## 2019-06-04 DIAGNOSIS — M810 Age-related osteoporosis without current pathological fracture: Secondary | ICD-10-CM | POA: Diagnosis not present

## 2019-06-04 DIAGNOSIS — G40812 Lennox-Gastaut syndrome, not intractable, without status epilepticus: Secondary | ICD-10-CM | POA: Diagnosis not present

## 2019-06-04 DIAGNOSIS — S82842D Displaced bimalleolar fracture of left lower leg, subsequent encounter for closed fracture with routine healing: Secondary | ICD-10-CM | POA: Diagnosis not present

## 2019-06-04 DIAGNOSIS — G40309 Generalized idiopathic epilepsy and epileptic syndromes, not intractable, without status epilepticus: Secondary | ICD-10-CM | POA: Diagnosis not present

## 2019-06-04 DIAGNOSIS — Z9181 History of falling: Secondary | ICD-10-CM | POA: Diagnosis not present

## 2019-06-06 DIAGNOSIS — M810 Age-related osteoporosis without current pathological fracture: Secondary | ICD-10-CM | POA: Diagnosis not present

## 2019-06-06 DIAGNOSIS — Z9181 History of falling: Secondary | ICD-10-CM | POA: Diagnosis not present

## 2019-06-06 DIAGNOSIS — S82842D Displaced bimalleolar fracture of left lower leg, subsequent encounter for closed fracture with routine healing: Secondary | ICD-10-CM | POA: Diagnosis not present

## 2019-06-06 DIAGNOSIS — G40309 Generalized idiopathic epilepsy and epileptic syndromes, not intractable, without status epilepticus: Secondary | ICD-10-CM | POA: Diagnosis not present

## 2019-06-06 DIAGNOSIS — G40812 Lennox-Gastaut syndrome, not intractable, without status epilepticus: Secondary | ICD-10-CM | POA: Diagnosis not present

## 2019-06-09 DIAGNOSIS — Z9181 History of falling: Secondary | ICD-10-CM | POA: Diagnosis not present

## 2019-06-09 DIAGNOSIS — G40309 Generalized idiopathic epilepsy and epileptic syndromes, not intractable, without status epilepticus: Secondary | ICD-10-CM | POA: Diagnosis not present

## 2019-06-09 DIAGNOSIS — G40812 Lennox-Gastaut syndrome, not intractable, without status epilepticus: Secondary | ICD-10-CM | POA: Diagnosis not present

## 2019-06-09 DIAGNOSIS — M810 Age-related osteoporosis without current pathological fracture: Secondary | ICD-10-CM | POA: Diagnosis not present

## 2019-06-09 DIAGNOSIS — S82842D Displaced bimalleolar fracture of left lower leg, subsequent encounter for closed fracture with routine healing: Secondary | ICD-10-CM | POA: Diagnosis not present

## 2019-06-10 DIAGNOSIS — Z9181 History of falling: Secondary | ICD-10-CM | POA: Diagnosis not present

## 2019-06-10 DIAGNOSIS — S82842D Displaced bimalleolar fracture of left lower leg, subsequent encounter for closed fracture with routine healing: Secondary | ICD-10-CM | POA: Diagnosis not present

## 2019-06-10 DIAGNOSIS — G40812 Lennox-Gastaut syndrome, not intractable, without status epilepticus: Secondary | ICD-10-CM | POA: Diagnosis not present

## 2019-06-10 DIAGNOSIS — M810 Age-related osteoporosis without current pathological fracture: Secondary | ICD-10-CM | POA: Diagnosis not present

## 2019-06-10 DIAGNOSIS — G40309 Generalized idiopathic epilepsy and epileptic syndromes, not intractable, without status epilepticus: Secondary | ICD-10-CM | POA: Diagnosis not present

## 2019-06-11 ENCOUNTER — Encounter (HOSPITAL_COMMUNITY): Payer: Self-pay

## 2019-06-11 ENCOUNTER — Other Ambulatory Visit: Payer: Self-pay

## 2019-06-11 ENCOUNTER — Emergency Department (HOSPITAL_COMMUNITY): Payer: Medicare Other

## 2019-06-11 ENCOUNTER — Emergency Department (HOSPITAL_COMMUNITY)
Admission: EM | Admit: 2019-06-11 | Discharge: 2019-06-12 | Disposition: A | Discharge status: Home / Self Care | Payer: Medicare Other | Attending: Emergency Medicine | Admitting: Emergency Medicine

## 2019-06-11 DIAGNOSIS — M545 Low back pain: Secondary | ICD-10-CM | POA: Diagnosis not present

## 2019-06-11 DIAGNOSIS — Y92009 Unspecified place in unspecified non-institutional (private) residence as the place of occurrence of the external cause: Secondary | ICD-10-CM | POA: Diagnosis not present

## 2019-06-11 DIAGNOSIS — S3282XA Multiple fractures of pelvis without disruption of pelvic ring, initial encounter for closed fracture: Secondary | ICD-10-CM | POA: Insufficient documentation

## 2019-06-11 DIAGNOSIS — S79912A Unspecified injury of left hip, initial encounter: Secondary | ICD-10-CM | POA: Diagnosis not present

## 2019-06-11 DIAGNOSIS — R52 Pain, unspecified: Secondary | ICD-10-CM | POA: Diagnosis not present

## 2019-06-11 DIAGNOSIS — G40834 Dravet syndrome, intractable, without status epilepticus: Secondary | ICD-10-CM | POA: Insufficient documentation

## 2019-06-11 DIAGNOSIS — Z79899 Other long term (current) drug therapy: Secondary | ICD-10-CM | POA: Diagnosis not present

## 2019-06-11 DIAGNOSIS — M25552 Pain in left hip: Secondary | ICD-10-CM | POA: Diagnosis not present

## 2019-06-11 DIAGNOSIS — W010XXA Fall on same level from slipping, tripping and stumbling without subsequent striking against object, initial encounter: Secondary | ICD-10-CM | POA: Insufficient documentation

## 2019-06-11 DIAGNOSIS — W19XXXA Unspecified fall, initial encounter: Secondary | ICD-10-CM

## 2019-06-11 DIAGNOSIS — S79911A Unspecified injury of right hip, initial encounter: Secondary | ICD-10-CM | POA: Diagnosis not present

## 2019-06-11 DIAGNOSIS — Y999 Unspecified external cause status: Secondary | ICD-10-CM | POA: Diagnosis not present

## 2019-06-11 DIAGNOSIS — Y9301 Activity, walking, marching and hiking: Secondary | ICD-10-CM | POA: Diagnosis not present

## 2019-06-11 DIAGNOSIS — I959 Hypotension, unspecified: Secondary | ICD-10-CM | POA: Diagnosis not present

## 2019-06-11 DIAGNOSIS — F88 Other disorders of psychological development: Secondary | ICD-10-CM | POA: Insufficient documentation

## 2019-06-11 DIAGNOSIS — S3993XA Unspecified injury of pelvis, initial encounter: Secondary | ICD-10-CM | POA: Diagnosis not present

## 2019-06-11 DIAGNOSIS — M25551 Pain in right hip: Secondary | ICD-10-CM | POA: Diagnosis not present

## 2019-06-11 DIAGNOSIS — R102 Pelvic and perineal pain: Secondary | ICD-10-CM | POA: Diagnosis not present

## 2019-06-11 DIAGNOSIS — T1490XA Injury, unspecified, initial encounter: Secondary | ICD-10-CM | POA: Diagnosis present

## 2019-06-11 NOTE — ED Provider Notes (Signed)
[facility]-EMERGENCY DEPT Provider Note   CSN: [id number] Arrival date & time: 06/11/19  2142     History Chief Complaint  Patient presents with  . Fall    Fall from standing position onto butt. Denies hitting head or blood thinners. Usually walks with assistance. Pt has hx of falls related to medical diagnosis.     Molly Duffy is a 49 y.o. 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 She has developmental delay and is unable to communicate well.  Mother states that she was walking today and falling forward.  She works with a Posey belt in a harness.  Her father pulled her back however she fall backward onto her bottom.  Since that time she has been unable to bear weight.  Mother states that she is unable to localize or tell her where the pain is but she thinks she is hurting.  Patient did not hit her head or lose consciousness.  She is not on any blood thinners.  HPI     Past Medical History:  Diagnosis Date  . Developmental delay   . Osteoporosis   . Seizures (HCC)   . 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 dental implants    . vns implant  2001   has been turned off     OB History   No obstetric history on file.     No family history on file.  Social History   Tobacco Use  . Smoking status: Never Smoker  . Smokeless tobacco: Never Used  Substance Use Topics  . Alcohol use: No  . Drug use: Not on file    Home Medications Prior to Admission medications   Medication Sig Start Date End Date Taking? Authorizing Provider  calcitonin, salmon, (MIACALCIN/FORTICAL) 200 UNIT/ACT nasal spray Place 1 spray into the nose daily.    [provider]  cholecalciferol (VITAMIN D) 1000 UNITS tablet Take 1,000 Units by mouth daily.    [provider]  Clobazam 10 MG  TABS Take 10 mg by mouth as directed. take 1 tab in the am., 2 at night 12/19/11   Milas Gain, MD  CLOBAZAM PO Take 15 mg by mouth daily.    [provider]  divalproex (DEPAKOTE SPRINKLE) 125 MG capsule Take 125 mg by mouth AC breakfast. Take with the 250 mg Depakote to equal 375 mg TID    [provider]  divalproex (DEPAKOTE) 250 MG DR tablet Take 250 mg by mouth at bedtime.    [provider]  fish oil-omega-3 fatty acids 1000 MG capsule Take 1 g by mouth daily.    [provider]  LORazepam (ATIVAN) 1 MG tablet Take 1 mg by mouth every 8 (eight) hours. One or two for seizures as needed.    [provider]  lorazepam (ATIVAN) 4 MG/ML injection 0.5 ml ( 2 mg) in one nostril prn prolonged seizure 12/06/11   Milas Gain, MD  magnesium oxide (MAG-OX) 400 MG tablet Take 400 mg by mouth daily.    [provider]  Multiple Vitamin (MULTIVITAMIN) tablet Take 1 tablet by mouth daily.    [provider]  nitrofurantoin, macrocrystal-monohydrate, (MACROBID) 100 MG capsule Take 100 mg by mouth 2 (two) times daily. 50 mg prn UTI    [provider]  zonisamide (ZONEGRAN) 25 MG capsule Take 75 mg by mouth daily.    [provider]    Allergies    Levetiracetam, Vigabatrin, Levetiracetam, Phenobarbital, Phenytoin sodium extended, Tiagabine hcl, and Tiagabine hcl  Review of Systems   Review of Systems Unable to review systems secondary to developmental delay and nonverbal Physical Exam Updated Vital Signs BP 106/71 (BP Location: Left Arm)   Pulse 88   Temp 97.8 F (36.6 C) (Oral)   Resp (!) 118   SpO2 100%   Physical Exam Vitals and nursing note reviewed.  Constitutional:      General: She is not in acute distress.    Appearance: She is well-developed. She is not diaphoretic.  HENT:     Head: Normocephalic and atraumatic.  Eyes:     General: No scleral icterus.    Conjunctiva/sclera: Conjunctivae normal.    Cardiovascular:     Rate and Rhythm: Normal rate and regular rhythm.     Heart sounds: Normal heart sounds. No murmur. No friction rub. No gallop.   Pulmonary:     Effort: Pulmonary effort is normal. No respiratory distress.     Breath sounds: Normal breath sounds.  Abdominal:     General: Bowel sounds are normal. There is no distension.     Palpations: Abdomen is soft. There is no mass.     Tenderness: There is no abdominal tenderness. There is no guarding.  Musculoskeletal:     Cervical back: Normal range of motion.     Comments: Exam is severely limited by patient's inability to communicate She has no pain with flexion extension of bilateral knees passively.  Pain with internal and external rotation of bilateral hips, pain to palpation of the lumbar region.  Skin:    General: Skin is warm and dry.  Neurological:     Mental Status: She is alert and oriented to person, place, and time.  Psychiatric:        Behavior: Behavior normal.     ED Results / Procedures / Treatments   Labs (all labs ordered are listed, but only abnormal results are displayed) Labs Reviewed - No data to display  EKG None  Radiology DG Lumbar Spine Complete  Result Date: 06/11/2019 CLINICAL DATA:  Larey Seat, low back pain EXAM: LUMBAR SPINE - COMPLETE 4+ VIEW COMPARISON:  None. FINDINGS: Frontal, bilateral oblique, and cross-table lateral views of the lumbar spine are obtained. There are 5 non-rib-bearing lumbar type vertebral bodies in anatomic alignment. No acute displaced fracture. Disc spaces are relatively well preserved. Sacroiliac joints are normal. IMPRESSION: 1. Unremarkable lumbar spine. Electronically Signed   By: Sharlet Salina M.D.   On: 06/11/2019 23:14   DG HIPS BILAT WITH PELVIS 3-4 VIEWS  Result Date: 06/11/2019 CLINICAL DATA:  Larey Seat, hip pain EXAM: DG HIP (WITH OR WITHOUT PELVIS) 3-4V BILAT COMPARISON:  None. FINDINGS: Frontal view of the pelvis as well as frontal and frogleg lateral views of  both hips are obtained. No acute displaced fractures. Joint spaces are well preserved. Alignment is anatomic. Sacroiliac joints are normal. IMPRESSION: 1. Unremarkable pelvis and bilateral hips. Electronically Signed   By: Sharlet Salina M.D.   On: 06/11/2019 23:13    Procedures Procedures (including critical care time)  Medications Ordered in ED Medications - No data to display  ED Course  I have reviewed the triage vital signs and the nursing notes.  Pertinent labs & imaging results that were available during my care of the patient were reviewed by me  and considered in my medical decision making (see chart for details).    MDM Rules/Calculators/A&P                       49 y/o F here with mechanical fall. I personally reviewed the plain films of the hips/ pelvis/ and lumbar region which shows no acute abnormalities on my interpretation. Ct shows R pubic ramus and R sacral ala fracture. These are stable fractures. I had a long discussion with the patient's mother at bedside. Patient has most of what she needs at home but may need a hoyer lift. She is already getting pt and nursing assistance. I have placed home health orders and asked sent a message to our [facility] team to reach out to the patient's mother Jackelyn Poling in the morning. Patient cannot take pain medications. Patient will be discharged to follow with her pcp and orthopedist.  Final Clinical Impression(s) / ED Diagnoses Final diagnoses:  Fall    Rx / DC Orders ED Discharge Orders    None       Margarita Mail, PA-C 50/09/38 [id number]    Delora Fuel, MD 93/71/69 [phone]

## 2019-06-11 NOTE — ED Triage Notes (Signed)
Fall from standing position onto butt. Denies hitting head or blood thinners. Usually walks with assistance. Pt has hx of falls related to medical diagnosis.

## 2019-06-12 ENCOUNTER — Telehealth: Payer: Self-pay | Admitting: Orthopaedic Surgery

## 2019-06-12 DIAGNOSIS — Z7401 Bed confinement status: Secondary | ICD-10-CM | POA: Diagnosis not present

## 2019-06-12 DIAGNOSIS — M2550 Pain in unspecified joint: Secondary | ICD-10-CM | POA: Diagnosis not present

## 2019-06-12 DIAGNOSIS — W19XXXA Unspecified fall, initial encounter: Secondary | ICD-10-CM | POA: Diagnosis not present

## 2019-06-12 DIAGNOSIS — S3289XA Fracture of other parts of pelvis, initial encounter for closed fracture: Secondary | ICD-10-CM | POA: Diagnosis not present

## 2019-06-12 NOTE — Discharge Instructions (Addendum)
Get help right away if you: Feel light-headed or faint. Develop chest pain. Develop shortness of breath. Have a fever. Have blood in your urine or your stools. Have bleeding in your vagina. Have difficulty or pain with urination or with passing stool. Have difficulty or increased pain with walking. Have new or increased swelling in one of your legs. Have numbness in your legs or groin area. 

## 2019-06-12 NOTE — Telephone Encounter (Signed)
Pt mother debbie called in wanting to speak to [facility] dr.blackman assistant about her daughters current condition to determine what she should do.   [phone]

## 2019-06-12 NOTE — Telephone Encounter (Signed)
Went to ED lastnight and has some pubic symphysis fractures and wonders what they should do They told her non-weightbearing for 6 weeks? Ok to wait until Monday to be seen with [place]

## 2019-06-12 NOTE — ED Notes (Signed)
[facility] called for pt transfer home  

## 2019-06-12 NOTE — ED Notes (Signed)
[facility] here for PT transfer home, Mother will ride along in ambulance

## 2019-06-13 DIAGNOSIS — E7849 Other hyperlipidemia: Secondary | ICD-10-CM | POA: Diagnosis not present

## 2019-06-13 DIAGNOSIS — I10 Essential (primary) hypertension: Secondary | ICD-10-CM | POA: Diagnosis not present

## 2019-06-16 ENCOUNTER — Ambulatory Visit: Payer: Medicare Other | Admitting: Orthopaedic Surgery

## 2019-06-17 DIAGNOSIS — M810 Age-related osteoporosis without current pathological fracture: Secondary | ICD-10-CM | POA: Diagnosis not present

## 2019-06-17 DIAGNOSIS — G40309 Generalized idiopathic epilepsy and epileptic syndromes, not intractable, without status epilepticus: Secondary | ICD-10-CM | POA: Diagnosis not present

## 2019-06-17 DIAGNOSIS — Z9181 History of falling: Secondary | ICD-10-CM | POA: Diagnosis not present

## 2019-06-17 DIAGNOSIS — G40812 Lennox-Gastaut syndrome, not intractable, without status epilepticus: Secondary | ICD-10-CM | POA: Diagnosis not present

## 2019-06-17 DIAGNOSIS — S82842D Displaced bimalleolar fracture of left lower leg, subsequent encounter for closed fracture with routine healing: Secondary | ICD-10-CM | POA: Diagnosis not present

## 2019-06-19 DIAGNOSIS — M810 Age-related osteoporosis without current pathological fracture: Secondary | ICD-10-CM | POA: Diagnosis not present

## 2019-06-19 DIAGNOSIS — G40309 Generalized idiopathic epilepsy and epileptic syndromes, not intractable, without status epilepticus: Secondary | ICD-10-CM | POA: Diagnosis not present

## 2019-06-19 DIAGNOSIS — Z9181 History of falling: Secondary | ICD-10-CM | POA: Diagnosis not present

## 2019-06-19 DIAGNOSIS — G40812 Lennox-Gastaut syndrome, not intractable, without status epilepticus: Secondary | ICD-10-CM | POA: Diagnosis not present

## 2019-06-19 DIAGNOSIS — S82842D Displaced bimalleolar fracture of left lower leg, subsequent encounter for closed fracture with routine healing: Secondary | ICD-10-CM | POA: Diagnosis not present

## 2019-06-23 DIAGNOSIS — Z0001 Encounter for general adult medical examination with abnormal findings: Secondary | ICD-10-CM | POA: Diagnosis not present

## 2019-06-23 DIAGNOSIS — G40309 Generalized idiopathic epilepsy and epileptic syndromes, not intractable, without status epilepticus: Secondary | ICD-10-CM | POA: Diagnosis not present

## 2019-06-23 DIAGNOSIS — G40812 Lennox-Gastaut syndrome, not intractable, without status epilepticus: Secondary | ICD-10-CM | POA: Diagnosis not present

## 2019-06-23 DIAGNOSIS — M80059A Age-related osteoporosis with current pathological fracture, unspecified femur, initial encounter for fracture: Secondary | ICD-10-CM | POA: Diagnosis not present

## 2019-06-25 DIAGNOSIS — G40812 Lennox-Gastaut syndrome, not intractable, without status epilepticus: Secondary | ICD-10-CM | POA: Diagnosis not present

## 2019-06-25 DIAGNOSIS — Z9181 History of falling: Secondary | ICD-10-CM | POA: Diagnosis not present

## 2019-06-25 DIAGNOSIS — G40309 Generalized idiopathic epilepsy and epileptic syndromes, not intractable, without status epilepticus: Secondary | ICD-10-CM | POA: Diagnosis not present

## 2019-06-25 DIAGNOSIS — M810 Age-related osteoporosis without current pathological fracture: Secondary | ICD-10-CM | POA: Diagnosis not present

## 2019-06-25 DIAGNOSIS — S82842D Displaced bimalleolar fracture of left lower leg, subsequent encounter for closed fracture with routine healing: Secondary | ICD-10-CM | POA: Diagnosis not present

## 2019-06-26 ENCOUNTER — Other Ambulatory Visit: Payer: Self-pay

## 2019-06-26 ENCOUNTER — Encounter: Payer: Self-pay | Admitting: Orthopaedic Surgery

## 2019-06-26 ENCOUNTER — Ambulatory Visit (INDEPENDENT_AMBULATORY_CARE_PROVIDER_SITE_OTHER): Payer: Medicare Other | Admitting: Orthopaedic Surgery

## 2019-06-26 ENCOUNTER — Ambulatory Visit (INDEPENDENT_AMBULATORY_CARE_PROVIDER_SITE_OTHER): Payer: Medicare Other

## 2019-06-26 DIAGNOSIS — S82842D Displaced bimalleolar fracture of left lower leg, subsequent encounter for closed fracture with routine healing: Secondary | ICD-10-CM

## 2019-06-26 NOTE — Progress Notes (Signed)
The patient is well-known to me.  She has a severe seizure disorder and is in the care of her parents.  She is 49 years old.  She is 4 months into a bimalleolar ankle fracture that we treated nonoperatively first with casting and then in a cam walking boot and now with an ASO.  Unfortunately she fell directly on her backside about 2 weeks ago injuring her pelvis.  She was seen in the emergency room and a CT scan showed nondisplaced fractures of the right sacral ala and the right pubic symphysis.  That has been painful to her according to her parents.  She is only just stood up recently to take some steps.  I was able to review the CT scan with the family.  Her primary care physician is recommending osteoporosis medication such as Forteo.  Apparently she was on Fosamax at one point and that had to be stopped.  She does have some osteopenia.  X-rays of her left ankle show that the bimalleolar ankle fracture is healed.  She does have good motion of her left ankle and I feel stable.  At this point follow-up can be as needed.  They can transition out of the ASO when she is comfortable with ambulating.  The pelvic fractures can be followed conservatively as well with just weightbearing as tolerated and pain control as needed.  All question concerns were answered and addressed.  Follow-up is as needed.

## 2019-06-27 DIAGNOSIS — G40812 Lennox-Gastaut syndrome, not intractable, without status epilepticus: Secondary | ICD-10-CM | POA: Diagnosis not present

## 2019-06-27 DIAGNOSIS — G40309 Generalized idiopathic epilepsy and epileptic syndromes, not intractable, without status epilepticus: Secondary | ICD-10-CM | POA: Diagnosis not present

## 2019-06-27 DIAGNOSIS — Z9181 History of falling: Secondary | ICD-10-CM | POA: Diagnosis not present

## 2019-06-27 DIAGNOSIS — M810 Age-related osteoporosis without current pathological fracture: Secondary | ICD-10-CM | POA: Diagnosis not present

## 2019-06-27 DIAGNOSIS — S82842D Displaced bimalleolar fracture of left lower leg, subsequent encounter for closed fracture with routine healing: Secondary | ICD-10-CM | POA: Diagnosis not present

## 2019-07-01 DIAGNOSIS — Z9181 History of falling: Secondary | ICD-10-CM | POA: Diagnosis not present

## 2019-07-01 DIAGNOSIS — G40812 Lennox-Gastaut syndrome, not intractable, without status epilepticus: Secondary | ICD-10-CM | POA: Diagnosis not present

## 2019-07-01 DIAGNOSIS — M810 Age-related osteoporosis without current pathological fracture: Secondary | ICD-10-CM | POA: Diagnosis not present

## 2019-07-01 DIAGNOSIS — G40309 Generalized idiopathic epilepsy and epileptic syndromes, not intractable, without status epilepticus: Secondary | ICD-10-CM | POA: Diagnosis not present

## 2019-07-01 DIAGNOSIS — S82842D Displaced bimalleolar fracture of left lower leg, subsequent encounter for closed fracture with routine healing: Secondary | ICD-10-CM | POA: Diagnosis not present

## 2019-07-04 ENCOUNTER — Other Ambulatory Visit: Payer: Self-pay | Admitting: *Deleted

## 2019-07-04 DIAGNOSIS — G40309 Generalized idiopathic epilepsy and epileptic syndromes, not intractable, without status epilepticus: Secondary | ICD-10-CM | POA: Diagnosis not present

## 2019-07-04 DIAGNOSIS — Z9181 History of falling: Secondary | ICD-10-CM | POA: Diagnosis not present

## 2019-07-04 DIAGNOSIS — M810 Age-related osteoporosis without current pathological fracture: Secondary | ICD-10-CM | POA: Diagnosis not present

## 2019-07-04 DIAGNOSIS — S82842D Displaced bimalleolar fracture of left lower leg, subsequent encounter for closed fracture with routine healing: Secondary | ICD-10-CM | POA: Diagnosis not present

## 2019-07-04 DIAGNOSIS — G40812 Lennox-Gastaut syndrome, not intractable, without status epilepticus: Secondary | ICD-10-CM | POA: Diagnosis not present

## 2019-07-04 NOTE — Patient Outreach (Signed)
Telephone outreach for primary care referral for in home support.  Talked with pt's mother who explained her daughter's medical condition. She states that since they saw Dr. Reuel Boom, things have calmed down and they have all the support and services they need including:  [facility] program which provides a daytime caregiver, a day program when pt is well enough to go outside the home. They have a care coodinator who is very proactive for them and a DSS case worker, [name].  We agreed that [facility] services would not be able to provide any further services of value for her daughter.  I will send them information on our services for future needs.  Molly Duffy. [name], MSN, [place] [facility] Nurse Practitioner [facility] [phone]

## 2019-07-07 ENCOUNTER — Ambulatory Visit: Payer: Self-pay | Admitting: *Deleted

## 2019-07-08 DIAGNOSIS — G40812 Lennox-Gastaut syndrome, not intractable, without status epilepticus: Secondary | ICD-10-CM | POA: Diagnosis not present

## 2019-07-08 DIAGNOSIS — M810 Age-related osteoporosis without current pathological fracture: Secondary | ICD-10-CM | POA: Diagnosis not present

## 2019-07-08 DIAGNOSIS — G40309 Generalized idiopathic epilepsy and epileptic syndromes, not intractable, without status epilepticus: Secondary | ICD-10-CM | POA: Diagnosis not present

## 2019-07-08 DIAGNOSIS — S82842D Displaced bimalleolar fracture of left lower leg, subsequent encounter for closed fracture with routine healing: Secondary | ICD-10-CM | POA: Diagnosis not present

## 2019-07-08 DIAGNOSIS — Z9181 History of falling: Secondary | ICD-10-CM | POA: Diagnosis not present

## 2019-07-09 DIAGNOSIS — Z9181 History of falling: Secondary | ICD-10-CM | POA: Diagnosis not present

## 2019-07-09 DIAGNOSIS — M810 Age-related osteoporosis without current pathological fracture: Secondary | ICD-10-CM | POA: Diagnosis not present

## 2019-07-09 DIAGNOSIS — S82842D Displaced bimalleolar fracture of left lower leg, subsequent encounter for closed fracture with routine healing: Secondary | ICD-10-CM | POA: Diagnosis not present

## 2019-07-09 DIAGNOSIS — G40309 Generalized idiopathic epilepsy and epileptic syndromes, not intractable, without status epilepticus: Secondary | ICD-10-CM | POA: Diagnosis not present

## 2019-07-10 DIAGNOSIS — G40309 Generalized idiopathic epilepsy and epileptic syndromes, not intractable, without status epilepticus: Secondary | ICD-10-CM | POA: Diagnosis not present

## 2019-07-10 DIAGNOSIS — M810 Age-related osteoporosis without current pathological fracture: Secondary | ICD-10-CM | POA: Diagnosis not present

## 2019-07-10 DIAGNOSIS — Z9181 History of falling: Secondary | ICD-10-CM | POA: Diagnosis not present

## 2019-07-10 DIAGNOSIS — S82842D Displaced bimalleolar fracture of left lower leg, subsequent encounter for closed fracture with routine healing: Secondary | ICD-10-CM | POA: Diagnosis not present

## 2019-07-16 DIAGNOSIS — E7849 Other hyperlipidemia: Secondary | ICD-10-CM | POA: Diagnosis not present

## 2019-07-16 DIAGNOSIS — I10 Essential (primary) hypertension: Secondary | ICD-10-CM | POA: Diagnosis not present

## 2019-07-17 DIAGNOSIS — G40309 Generalized idiopathic epilepsy and epileptic syndromes, not intractable, without status epilepticus: Secondary | ICD-10-CM | POA: Diagnosis not present

## 2019-07-17 DIAGNOSIS — M810 Age-related osteoporosis without current pathological fracture: Secondary | ICD-10-CM | POA: Diagnosis not present

## 2019-07-17 DIAGNOSIS — S82842D Displaced bimalleolar fracture of left lower leg, subsequent encounter for closed fracture with routine healing: Secondary | ICD-10-CM | POA: Diagnosis not present

## 2019-07-17 DIAGNOSIS — Z9181 History of falling: Secondary | ICD-10-CM | POA: Diagnosis not present

## 2019-07-18 DIAGNOSIS — M810 Age-related osteoporosis without current pathological fracture: Secondary | ICD-10-CM | POA: Diagnosis not present

## 2019-07-18 DIAGNOSIS — Z9181 History of falling: Secondary | ICD-10-CM | POA: Diagnosis not present

## 2019-07-18 DIAGNOSIS — S82842D Displaced bimalleolar fracture of left lower leg, subsequent encounter for closed fracture with routine healing: Secondary | ICD-10-CM | POA: Diagnosis not present

## 2019-07-18 DIAGNOSIS — G40309 Generalized idiopathic epilepsy and epileptic syndromes, not intractable, without status epilepticus: Secondary | ICD-10-CM | POA: Diagnosis not present

## 2019-07-22 DIAGNOSIS — G40309 Generalized idiopathic epilepsy and epileptic syndromes, not intractable, without status epilepticus: Secondary | ICD-10-CM | POA: Diagnosis not present

## 2019-07-22 DIAGNOSIS — M810 Age-related osteoporosis without current pathological fracture: Secondary | ICD-10-CM | POA: Diagnosis not present

## 2019-07-22 DIAGNOSIS — Z9181 History of falling: Secondary | ICD-10-CM | POA: Diagnosis not present

## 2019-07-22 DIAGNOSIS — S82842D Displaced bimalleolar fracture of left lower leg, subsequent encounter for closed fracture with routine healing: Secondary | ICD-10-CM | POA: Diagnosis not present

## 2019-07-24 DIAGNOSIS — Z9181 History of falling: Secondary | ICD-10-CM | POA: Diagnosis not present

## 2019-07-24 DIAGNOSIS — S82842D Displaced bimalleolar fracture of left lower leg, subsequent encounter for closed fracture with routine healing: Secondary | ICD-10-CM | POA: Diagnosis not present

## 2019-07-24 DIAGNOSIS — M810 Age-related osteoporosis without current pathological fracture: Secondary | ICD-10-CM | POA: Diagnosis not present

## 2019-07-24 DIAGNOSIS — G40309 Generalized idiopathic epilepsy and epileptic syndromes, not intractable, without status epilepticus: Secondary | ICD-10-CM | POA: Diagnosis not present

## 2019-07-29 DIAGNOSIS — S82842D Displaced bimalleolar fracture of left lower leg, subsequent encounter for closed fracture with routine healing: Secondary | ICD-10-CM | POA: Diagnosis not present

## 2019-07-29 DIAGNOSIS — G40309 Generalized idiopathic epilepsy and epileptic syndromes, not intractable, without status epilepticus: Secondary | ICD-10-CM | POA: Diagnosis not present

## 2019-07-29 DIAGNOSIS — M810 Age-related osteoporosis without current pathological fracture: Secondary | ICD-10-CM | POA: Diagnosis not present

## 2019-07-29 DIAGNOSIS — Z9181 History of falling: Secondary | ICD-10-CM | POA: Diagnosis not present

## 2019-07-31 DIAGNOSIS — S82842D Displaced bimalleolar fracture of left lower leg, subsequent encounter for closed fracture with routine healing: Secondary | ICD-10-CM | POA: Diagnosis not present

## 2019-07-31 DIAGNOSIS — Z9181 History of falling: Secondary | ICD-10-CM | POA: Diagnosis not present

## 2019-07-31 DIAGNOSIS — G40309 Generalized idiopathic epilepsy and epileptic syndromes, not intractable, without status epilepticus: Secondary | ICD-10-CM | POA: Diagnosis not present

## 2019-07-31 DIAGNOSIS — M810 Age-related osteoporosis without current pathological fracture: Secondary | ICD-10-CM | POA: Diagnosis not present

## 2019-08-07 DIAGNOSIS — M810 Age-related osteoporosis without current pathological fracture: Secondary | ICD-10-CM | POA: Diagnosis not present

## 2019-08-07 DIAGNOSIS — S82842D Displaced bimalleolar fracture of left lower leg, subsequent encounter for closed fracture with routine healing: Secondary | ICD-10-CM | POA: Diagnosis not present

## 2019-08-07 DIAGNOSIS — Z9181 History of falling: Secondary | ICD-10-CM | POA: Diagnosis not present

## 2019-08-07 DIAGNOSIS — G40309 Generalized idiopathic epilepsy and epileptic syndromes, not intractable, without status epilepticus: Secondary | ICD-10-CM | POA: Diagnosis not present

## 2019-08-08 DIAGNOSIS — Z9181 History of falling: Secondary | ICD-10-CM | POA: Diagnosis not present

## 2019-08-08 DIAGNOSIS — G40309 Generalized idiopathic epilepsy and epileptic syndromes, not intractable, without status epilepticus: Secondary | ICD-10-CM | POA: Diagnosis not present

## 2019-08-08 DIAGNOSIS — S82842D Displaced bimalleolar fracture of left lower leg, subsequent encounter for closed fracture with routine healing: Secondary | ICD-10-CM | POA: Diagnosis not present

## 2019-08-08 DIAGNOSIS — M810 Age-related osteoporosis without current pathological fracture: Secondary | ICD-10-CM | POA: Diagnosis not present

## 2019-08-14 DIAGNOSIS — S82842D Displaced bimalleolar fracture of left lower leg, subsequent encounter for closed fracture with routine healing: Secondary | ICD-10-CM | POA: Diagnosis not present

## 2019-08-14 DIAGNOSIS — Z9181 History of falling: Secondary | ICD-10-CM | POA: Diagnosis not present

## 2019-08-14 DIAGNOSIS — M810 Age-related osteoporosis without current pathological fracture: Secondary | ICD-10-CM | POA: Diagnosis not present

## 2019-08-14 DIAGNOSIS — G40309 Generalized idiopathic epilepsy and epileptic syndromes, not intractable, without status epilepticus: Secondary | ICD-10-CM | POA: Diagnosis not present

## 2019-08-15 DIAGNOSIS — M810 Age-related osteoporosis without current pathological fracture: Secondary | ICD-10-CM | POA: Diagnosis not present

## 2019-08-15 DIAGNOSIS — M79605 Pain in left leg: Secondary | ICD-10-CM | POA: Diagnosis not present

## 2019-08-21 DIAGNOSIS — S82842D Displaced bimalleolar fracture of left lower leg, subsequent encounter for closed fracture with routine healing: Secondary | ICD-10-CM | POA: Diagnosis not present

## 2019-08-21 DIAGNOSIS — Z9181 History of falling: Secondary | ICD-10-CM | POA: Diagnosis not present

## 2019-08-21 DIAGNOSIS — M810 Age-related osteoporosis without current pathological fracture: Secondary | ICD-10-CM | POA: Diagnosis not present

## 2019-08-21 DIAGNOSIS — G40309 Generalized idiopathic epilepsy and epileptic syndromes, not intractable, without status epilepticus: Secondary | ICD-10-CM | POA: Diagnosis not present

## 2019-08-26 DIAGNOSIS — Z9181 History of falling: Secondary | ICD-10-CM | POA: Diagnosis not present

## 2019-08-26 DIAGNOSIS — G40309 Generalized idiopathic epilepsy and epileptic syndromes, not intractable, without status epilepticus: Secondary | ICD-10-CM | POA: Diagnosis not present

## 2019-08-26 DIAGNOSIS — S82842D Displaced bimalleolar fracture of left lower leg, subsequent encounter for closed fracture with routine healing: Secondary | ICD-10-CM | POA: Diagnosis not present

## 2019-08-26 DIAGNOSIS — M810 Age-related osteoporosis without current pathological fracture: Secondary | ICD-10-CM | POA: Diagnosis not present

## 2019-09-01 DIAGNOSIS — G40409 Other generalized epilepsy and epileptic syndromes, not intractable, without status epilepticus: Secondary | ICD-10-CM | POA: Diagnosis not present

## 2019-09-03 DIAGNOSIS — M810 Age-related osteoporosis without current pathological fracture: Secondary | ICD-10-CM | POA: Diagnosis not present

## 2019-09-03 DIAGNOSIS — G40309 Generalized idiopathic epilepsy and epileptic syndromes, not intractable, without status epilepticus: Secondary | ICD-10-CM | POA: Diagnosis not present

## 2019-09-03 DIAGNOSIS — Z9181 History of falling: Secondary | ICD-10-CM | POA: Diagnosis not present

## 2019-09-03 DIAGNOSIS — S82842D Displaced bimalleolar fracture of left lower leg, subsequent encounter for closed fracture with routine healing: Secondary | ICD-10-CM | POA: Diagnosis not present

## 2019-09-05 ENCOUNTER — Telehealth: Payer: Self-pay | Admitting: Orthopaedic Surgery

## 2019-09-05 DIAGNOSIS — G40834 Dravet syndrome, intractable, without status epilepticus: Secondary | ICD-10-CM | POA: Diagnosis not present

## 2019-09-05 NOTE — Telephone Encounter (Signed)
Verbal order given  

## 2019-09-05 NOTE — Telephone Encounter (Signed)
Vernona Rieger with [facility], they would like to continue with Home Health for  1x for 8 weeks.  CB#[phone].  Thank you.

## 2019-09-07 DIAGNOSIS — S82842D Displaced bimalleolar fracture of left lower leg, subsequent encounter for closed fracture with routine healing: Secondary | ICD-10-CM | POA: Diagnosis not present

## 2019-09-09 DIAGNOSIS — G40834 Dravet syndrome, intractable, without status epilepticus: Secondary | ICD-10-CM | POA: Diagnosis not present

## 2019-09-09 DIAGNOSIS — Z79899 Other long term (current) drug therapy: Secondary | ICD-10-CM | POA: Diagnosis not present

## 2019-09-25 DIAGNOSIS — G40834 Dravet syndrome, intractable, without status epilepticus: Secondary | ICD-10-CM | POA: Diagnosis not present

## 2019-09-25 DIAGNOSIS — Z452 Encounter for adjustment and management of vascular access device: Secondary | ICD-10-CM | POA: Diagnosis not present

## 2019-10-07 DIAGNOSIS — Z9181 History of falling: Secondary | ICD-10-CM | POA: Diagnosis not present

## 2019-10-07 DIAGNOSIS — M810 Age-related osteoporosis without current pathological fracture: Secondary | ICD-10-CM | POA: Diagnosis not present

## 2019-10-07 DIAGNOSIS — S82842D Displaced bimalleolar fracture of left lower leg, subsequent encounter for closed fracture with routine healing: Secondary | ICD-10-CM | POA: Diagnosis not present

## 2019-10-07 DIAGNOSIS — G40309 Generalized idiopathic epilepsy and epileptic syndromes, not intractable, without status epilepticus: Secondary | ICD-10-CM | POA: Diagnosis not present

## 2019-10-07 DIAGNOSIS — S3282XB Multiple fractures of pelvis without disruption of pelvic ring, initial encounter for open fracture: Secondary | ICD-10-CM | POA: Diagnosis not present

## 2019-10-09 DIAGNOSIS — G40309 Generalized idiopathic epilepsy and epileptic syndromes, not intractable, without status epilepticus: Secondary | ICD-10-CM | POA: Diagnosis not present

## 2019-10-09 DIAGNOSIS — Z9181 History of falling: Secondary | ICD-10-CM | POA: Diagnosis not present

## 2019-10-09 DIAGNOSIS — M810 Age-related osteoporosis without current pathological fracture: Secondary | ICD-10-CM | POA: Diagnosis not present

## 2019-10-09 DIAGNOSIS — S3282XB Multiple fractures of pelvis without disruption of pelvic ring, initial encounter for open fracture: Secondary | ICD-10-CM | POA: Diagnosis not present

## 2019-10-09 DIAGNOSIS — S82842D Displaced bimalleolar fracture of left lower leg, subsequent encounter for closed fracture with routine healing: Secondary | ICD-10-CM | POA: Diagnosis not present

## 2019-10-17 DIAGNOSIS — S3282XB Multiple fractures of pelvis without disruption of pelvic ring, initial encounter for open fracture: Secondary | ICD-10-CM | POA: Diagnosis not present

## 2019-10-17 DIAGNOSIS — M810 Age-related osteoporosis without current pathological fracture: Secondary | ICD-10-CM | POA: Diagnosis not present

## 2019-10-17 DIAGNOSIS — Z9181 History of falling: Secondary | ICD-10-CM | POA: Diagnosis not present

## 2019-10-17 DIAGNOSIS — G40309 Generalized idiopathic epilepsy and epileptic syndromes, not intractable, without status epilepticus: Secondary | ICD-10-CM | POA: Diagnosis not present

## 2019-10-17 DIAGNOSIS — S82842D Displaced bimalleolar fracture of left lower leg, subsequent encounter for closed fracture with routine healing: Secondary | ICD-10-CM | POA: Diagnosis not present

## 2019-10-24 DIAGNOSIS — M810 Age-related osteoporosis without current pathological fracture: Secondary | ICD-10-CM | POA: Diagnosis not present

## 2019-10-24 DIAGNOSIS — G40309 Generalized idiopathic epilepsy and epileptic syndromes, not intractable, without status epilepticus: Secondary | ICD-10-CM | POA: Diagnosis not present

## 2019-10-24 DIAGNOSIS — S3282XB Multiple fractures of pelvis without disruption of pelvic ring, initial encounter for open fracture: Secondary | ICD-10-CM | POA: Diagnosis not present

## 2019-10-24 DIAGNOSIS — S82842D Displaced bimalleolar fracture of left lower leg, subsequent encounter for closed fracture with routine healing: Secondary | ICD-10-CM | POA: Diagnosis not present

## 2019-10-24 DIAGNOSIS — Z9181 History of falling: Secondary | ICD-10-CM | POA: Diagnosis not present

## 2019-11-04 DIAGNOSIS — G40309 Generalized idiopathic epilepsy and epileptic syndromes, not intractable, without status epilepticus: Secondary | ICD-10-CM | POA: Diagnosis not present

## 2019-11-04 DIAGNOSIS — S3282XB Multiple fractures of pelvis without disruption of pelvic ring, initial encounter for open fracture: Secondary | ICD-10-CM | POA: Diagnosis not present

## 2019-11-04 DIAGNOSIS — Z9181 History of falling: Secondary | ICD-10-CM | POA: Diagnosis not present

## 2019-11-04 DIAGNOSIS — S82842D Displaced bimalleolar fracture of left lower leg, subsequent encounter for closed fracture with routine healing: Secondary | ICD-10-CM | POA: Diagnosis not present

## 2019-11-04 DIAGNOSIS — M810 Age-related osteoporosis without current pathological fracture: Secondary | ICD-10-CM | POA: Diagnosis not present

## 2019-11-06 DIAGNOSIS — S3282XB Multiple fractures of pelvis without disruption of pelvic ring, initial encounter for open fracture: Secondary | ICD-10-CM | POA: Diagnosis not present

## 2019-11-06 DIAGNOSIS — S82842D Displaced bimalleolar fracture of left lower leg, subsequent encounter for closed fracture with routine healing: Secondary | ICD-10-CM | POA: Diagnosis not present

## 2019-11-06 DIAGNOSIS — Z9181 History of falling: Secondary | ICD-10-CM | POA: Diagnosis not present

## 2019-11-06 DIAGNOSIS — G40309 Generalized idiopathic epilepsy and epileptic syndromes, not intractable, without status epilepticus: Secondary | ICD-10-CM | POA: Diagnosis not present

## 2019-11-06 DIAGNOSIS — M810 Age-related osteoporosis without current pathological fracture: Secondary | ICD-10-CM | POA: Diagnosis not present

## 2019-11-11 DIAGNOSIS — G40219 Localization-related (focal) (partial) symptomatic epilepsy and epileptic syndromes with complex partial seizures, intractable, without status epilepticus: Secondary | ICD-10-CM | POA: Diagnosis not present

## 2019-11-11 DIAGNOSIS — G458 Other transient cerebral ischemic attacks and related syndromes: Secondary | ICD-10-CM | POA: Diagnosis not present

## 2019-11-11 DIAGNOSIS — I083 Combined rheumatic disorders of mitral, aortic and tricuspid valves: Secondary | ICD-10-CM | POA: Diagnosis not present

## 2019-11-13 DIAGNOSIS — Z9181 History of falling: Secondary | ICD-10-CM | POA: Diagnosis not present

## 2019-11-13 DIAGNOSIS — G40309 Generalized idiopathic epilepsy and epileptic syndromes, not intractable, without status epilepticus: Secondary | ICD-10-CM | POA: Diagnosis not present

## 2019-11-13 DIAGNOSIS — S82842D Displaced bimalleolar fracture of left lower leg, subsequent encounter for closed fracture with routine healing: Secondary | ICD-10-CM | POA: Diagnosis not present

## 2019-11-13 DIAGNOSIS — M810 Age-related osteoporosis without current pathological fracture: Secondary | ICD-10-CM | POA: Diagnosis not present

## 2019-11-13 DIAGNOSIS — S3282XB Multiple fractures of pelvis without disruption of pelvic ring, initial encounter for open fracture: Secondary | ICD-10-CM | POA: Diagnosis not present

## 2019-11-24 DIAGNOSIS — G40834 Dravet syndrome, intractable, without status epilepticus: Secondary | ICD-10-CM | POA: Diagnosis not present

## 2019-11-27 DIAGNOSIS — S3282XB Multiple fractures of pelvis without disruption of pelvic ring, initial encounter for open fracture: Secondary | ICD-10-CM | POA: Diagnosis not present

## 2019-11-27 DIAGNOSIS — G40309 Generalized idiopathic epilepsy and epileptic syndromes, not intractable, without status epilepticus: Secondary | ICD-10-CM | POA: Diagnosis not present

## 2019-11-27 DIAGNOSIS — S82842D Displaced bimalleolar fracture of left lower leg, subsequent encounter for closed fracture with routine healing: Secondary | ICD-10-CM | POA: Diagnosis not present

## 2019-11-27 DIAGNOSIS — M810 Age-related osteoporosis without current pathological fracture: Secondary | ICD-10-CM | POA: Diagnosis not present

## 2019-11-27 DIAGNOSIS — Z9181 History of falling: Secondary | ICD-10-CM | POA: Diagnosis not present

## 2019-12-06 DIAGNOSIS — S82842D Displaced bimalleolar fracture of left lower leg, subsequent encounter for closed fracture with routine healing: Secondary | ICD-10-CM | POA: Diagnosis not present

## 2020-01-14 DIAGNOSIS — Z23 Encounter for immunization: Secondary | ICD-10-CM | POA: Diagnosis not present

## 2020-01-15 DIAGNOSIS — R600 Localized edema: Secondary | ICD-10-CM | POA: Diagnosis not present

## 2020-01-15 DIAGNOSIS — M810 Age-related osteoporosis without current pathological fracture: Secondary | ICD-10-CM | POA: Diagnosis not present

## 2020-01-15 DIAGNOSIS — G40309 Generalized idiopathic epilepsy and epileptic syndromes, not intractable, without status epilepticus: Secondary | ICD-10-CM | POA: Diagnosis not present

## 2020-02-14 DIAGNOSIS — R600 Localized edema: Secondary | ICD-10-CM | POA: Diagnosis not present

## 2020-02-14 DIAGNOSIS — M810 Age-related osteoporosis without current pathological fracture: Secondary | ICD-10-CM | POA: Diagnosis not present

## 2020-02-14 DIAGNOSIS — G40309 Generalized idiopathic epilepsy and epileptic syndromes, not intractable, without status epilepticus: Secondary | ICD-10-CM | POA: Diagnosis not present

## 2020-02-26 DIAGNOSIS — Z7189 Other specified counseling: Secondary | ICD-10-CM | POA: Diagnosis not present

## 2020-02-26 DIAGNOSIS — M810 Age-related osteoporosis without current pathological fracture: Secondary | ICD-10-CM | POA: Diagnosis not present

## 2020-02-26 DIAGNOSIS — R799 Abnormal finding of blood chemistry, unspecified: Secondary | ICD-10-CM | POA: Diagnosis not present

## 2020-02-26 DIAGNOSIS — G40834 Dravet syndrome, intractable, without status epilepticus: Secondary | ICD-10-CM | POA: Diagnosis not present

## 2020-02-26 DIAGNOSIS — G40309 Generalized idiopathic epilepsy and epileptic syndromes, not intractable, without status epilepticus: Secondary | ICD-10-CM | POA: Diagnosis not present

## 2020-02-26 DIAGNOSIS — M80059A Age-related osteoporosis with current pathological fracture, unspecified femur, initial encounter for fracture: Secondary | ICD-10-CM | POA: Diagnosis not present

## 2020-02-26 DIAGNOSIS — G40812 Lennox-Gastaut syndrome, not intractable, without status epilepticus: Secondary | ICD-10-CM | POA: Diagnosis not present

## 2020-02-28 DIAGNOSIS — Z23 Encounter for immunization: Secondary | ICD-10-CM | POA: Diagnosis not present

## 2020-03-16 DIAGNOSIS — R600 Localized edema: Secondary | ICD-10-CM | POA: Diagnosis not present

## 2020-03-16 DIAGNOSIS — M810 Age-related osteoporosis without current pathological fracture: Secondary | ICD-10-CM | POA: Diagnosis not present

## 2020-03-16 DIAGNOSIS — G40309 Generalized idiopathic epilepsy and epileptic syndromes, not intractable, without status epilepticus: Secondary | ICD-10-CM | POA: Diagnosis not present

## 2020-03-17 DIAGNOSIS — G40833 Dravet syndrome, intractable, with status epilepticus: Secondary | ICD-10-CM | POA: Diagnosis not present

## 2020-03-17 DIAGNOSIS — E8889 Other specified metabolic disorders: Secondary | ICD-10-CM | POA: Diagnosis not present

## 2020-04-16 DIAGNOSIS — G40309 Generalized idiopathic epilepsy and epileptic syndromes, not intractable, without status epilepticus: Secondary | ICD-10-CM | POA: Diagnosis not present

## 2020-04-16 DIAGNOSIS — R600 Localized edema: Secondary | ICD-10-CM | POA: Diagnosis not present

## 2020-04-16 DIAGNOSIS — M810 Age-related osteoporosis without current pathological fracture: Secondary | ICD-10-CM | POA: Diagnosis not present

## 2020-05-04 DIAGNOSIS — G40219 Localization-related (focal) (partial) symptomatic epilepsy and epileptic syndromes with complex partial seizures, intractable, without status epilepticus: Secondary | ICD-10-CM | POA: Diagnosis not present

## 2020-05-04 DIAGNOSIS — Z79899 Other long term (current) drug therapy: Secondary | ICD-10-CM | POA: Diagnosis not present

## 2020-05-04 DIAGNOSIS — R197 Diarrhea, unspecified: Secondary | ICD-10-CM | POA: Diagnosis not present

## 2020-05-04 DIAGNOSIS — G40834 Dravet syndrome, intractable, without status epilepticus: Secondary | ICD-10-CM | POA: Diagnosis not present

## 2020-05-15 DIAGNOSIS — M810 Age-related osteoporosis without current pathological fracture: Secondary | ICD-10-CM | POA: Diagnosis not present

## 2020-05-15 DIAGNOSIS — G40309 Generalized idiopathic epilepsy and epileptic syndromes, not intractable, without status epilepticus: Secondary | ICD-10-CM | POA: Diagnosis not present

## 2020-05-15 DIAGNOSIS — R600 Localized edema: Secondary | ICD-10-CM | POA: Diagnosis not present

## 2020-05-21 DIAGNOSIS — G40802 Other epilepsy, not intractable, without status epilepticus: Secondary | ICD-10-CM | POA: Diagnosis not present

## 2020-05-21 DIAGNOSIS — I361 Nonrheumatic tricuspid (valve) insufficiency: Secondary | ICD-10-CM | POA: Diagnosis not present

## 2020-05-21 DIAGNOSIS — Z9114 Patient's other noncompliance with medication regimen: Secondary | ICD-10-CM | POA: Diagnosis not present

## 2020-05-21 DIAGNOSIS — I082 Rheumatic disorders of both aortic and tricuspid valves: Secondary | ICD-10-CM | POA: Diagnosis not present

## 2020-05-21 DIAGNOSIS — I351 Nonrheumatic aortic (valve) insufficiency: Secondary | ICD-10-CM | POA: Diagnosis not present

## 2020-05-21 DIAGNOSIS — I517 Cardiomegaly: Secondary | ICD-10-CM | POA: Diagnosis not present

## 2020-05-27 DIAGNOSIS — Z1273 Encounter for screening for malignant neoplasm of ovary: Secondary | ICD-10-CM | POA: Diagnosis not present

## 2020-05-27 DIAGNOSIS — Z124 Encounter for screening for malignant neoplasm of cervix: Secondary | ICD-10-CM | POA: Diagnosis not present

## 2020-05-27 DIAGNOSIS — R140 Abdominal distension (gaseous): Secondary | ICD-10-CM | POA: Diagnosis not present

## 2020-05-27 DIAGNOSIS — Z6821 Body mass index (BMI) 21.0-21.9, adult: Secondary | ICD-10-CM | POA: Diagnosis not present

## 2020-06-23 DIAGNOSIS — M80059A Age-related osteoporosis with current pathological fracture, unspecified femur, initial encounter for fracture: Secondary | ICD-10-CM | POA: Diagnosis not present

## 2020-06-23 DIAGNOSIS — G40812 Lennox-Gastaut syndrome, not intractable, without status epilepticus: Secondary | ICD-10-CM | POA: Diagnosis not present

## 2020-06-23 DIAGNOSIS — Z9189 Other specified personal risk factors, not elsewhere classified: Secondary | ICD-10-CM | POA: Diagnosis not present

## 2020-06-23 DIAGNOSIS — Z23 Encounter for immunization: Secondary | ICD-10-CM | POA: Diagnosis not present

## 2020-06-23 DIAGNOSIS — M810 Age-related osteoporosis without current pathological fracture: Secondary | ICD-10-CM | POA: Diagnosis not present

## 2020-06-23 DIAGNOSIS — G40309 Generalized idiopathic epilepsy and epileptic syndromes, not intractable, without status epilepticus: Secondary | ICD-10-CM | POA: Diagnosis not present

## 2020-06-23 DIAGNOSIS — Z6821 Body mass index (BMI) 21.0-21.9, adult: Secondary | ICD-10-CM | POA: Diagnosis not present

## 2020-06-24 ENCOUNTER — Other Ambulatory Visit: Payer: Self-pay | Admitting: Family Medicine

## 2020-06-24 DIAGNOSIS — M810 Age-related osteoporosis without current pathological fracture: Secondary | ICD-10-CM

## 2020-07-02 DIAGNOSIS — J309 Allergic rhinitis, unspecified: Secondary | ICD-10-CM | POA: Diagnosis not present

## 2020-07-11 DIAGNOSIS — Z20822 Contact with and (suspected) exposure to covid-19: Secondary | ICD-10-CM | POA: Diagnosis not present

## 2020-07-21 DIAGNOSIS — G40834 Dravet syndrome, intractable, without status epilepticus: Secondary | ICD-10-CM | POA: Diagnosis not present

## 2020-08-05 DIAGNOSIS — Z79899 Other long term (current) drug therapy: Secondary | ICD-10-CM | POA: Diagnosis not present

## 2020-08-05 DIAGNOSIS — G40834 Dravet syndrome, intractable, without status epilepticus: Secondary | ICD-10-CM | POA: Diagnosis not present

## 2020-10-28 ENCOUNTER — Ambulatory Visit
Admission: RE | Admit: 2020-10-28 | Discharge: 2020-10-28 | Disposition: A | Discharge status: Home / Self Care | Payer: Medicare Other | Source: Ambulatory Visit | Attending: Family Medicine | Admitting: Family Medicine

## 2020-10-28 ENCOUNTER — Other Ambulatory Visit: Payer: Self-pay

## 2020-10-28 DIAGNOSIS — Z780 Asymptomatic menopausal state: Secondary | ICD-10-CM | POA: Diagnosis not present

## 2020-10-28 DIAGNOSIS — M810 Age-related osteoporosis without current pathological fracture: Secondary | ICD-10-CM | POA: Diagnosis not present

## 2020-10-29 DIAGNOSIS — G40833 Dravet syndrome, intractable, with status epilepticus: Secondary | ICD-10-CM | POA: Diagnosis not present

## 2020-10-29 DIAGNOSIS — I6781 Acute cerebrovascular insufficiency: Secondary | ICD-10-CM | POA: Diagnosis not present

## 2020-11-02 DIAGNOSIS — N390 Urinary tract infection, site not specified: Secondary | ICD-10-CM | POA: Diagnosis not present

## 2020-11-02 DIAGNOSIS — M545 Low back pain, unspecified: Secondary | ICD-10-CM | POA: Diagnosis not present

## 2020-11-06 DIAGNOSIS — G40833 Dravet syndrome, intractable, with status epilepticus: Secondary | ICD-10-CM | POA: Diagnosis not present

## 2020-11-12 DIAGNOSIS — T50916D Underdosing of multiple unspecified drugs, medicaments and biological substances, subsequent encounter: Secondary | ICD-10-CM | POA: Diagnosis not present

## 2020-11-12 DIAGNOSIS — T887XXD Unspecified adverse effect of drug or medicament, subsequent encounter: Secondary | ICD-10-CM | POA: Diagnosis not present

## 2020-11-12 DIAGNOSIS — G40834 Dravet syndrome, intractable, without status epilepticus: Secondary | ICD-10-CM | POA: Diagnosis not present

## 2020-11-16 DIAGNOSIS — R7989 Other specified abnormal findings of blood chemistry: Secondary | ICD-10-CM | POA: Diagnosis not present

## 2020-11-24 ENCOUNTER — Other Ambulatory Visit: Payer: Medicare Other

## 2020-12-03 DIAGNOSIS — G40833 Dravet syndrome, intractable, with status epilepticus: Secondary | ICD-10-CM | POA: Diagnosis not present

## 2020-12-23 DIAGNOSIS — G40834 Dravet syndrome, intractable, without status epilepticus: Secondary | ICD-10-CM | POA: Diagnosis not present

## 2020-12-23 DIAGNOSIS — Z79899 Other long term (current) drug therapy: Secondary | ICD-10-CM | POA: Diagnosis not present

## 2020-12-28 DIAGNOSIS — G40834 Dravet syndrome, intractable, without status epilepticus: Secondary | ICD-10-CM | POA: Diagnosis not present

## 2020-12-30 DIAGNOSIS — Z6820 Body mass index (BMI) 20.0-20.9, adult: Secondary | ICD-10-CM | POA: Diagnosis not present

## 2020-12-30 DIAGNOSIS — G40812 Lennox-Gastaut syndrome, not intractable, without status epilepticus: Secondary | ICD-10-CM | POA: Diagnosis not present

## 2020-12-30 DIAGNOSIS — M80059A Age-related osteoporosis with current pathological fracture, unspecified femur, initial encounter for fracture: Secondary | ICD-10-CM | POA: Diagnosis not present

## 2020-12-30 DIAGNOSIS — Z7189 Other specified counseling: Secondary | ICD-10-CM | POA: Diagnosis not present

## 2020-12-30 DIAGNOSIS — T50905A Adverse effect of unspecified drugs, medicaments and biological substances, initial encounter: Secondary | ICD-10-CM | POA: Diagnosis not present

## 2020-12-30 DIAGNOSIS — G40309 Generalized idiopathic epilepsy and epileptic syndromes, not intractable, without status epilepticus: Secondary | ICD-10-CM | POA: Diagnosis not present

## 2020-12-30 DIAGNOSIS — R4582 Worries: Secondary | ICD-10-CM | POA: Diagnosis not present

## 2020-12-30 DIAGNOSIS — D6959 Other secondary thrombocytopenia: Secondary | ICD-10-CM | POA: Diagnosis not present

## 2021-01-04 DIAGNOSIS — G40834 Dravet syndrome, intractable, without status epilepticus: Secondary | ICD-10-CM | POA: Diagnosis not present

## 2021-01-10 DIAGNOSIS — R300 Dysuria: Secondary | ICD-10-CM | POA: Diagnosis not present

## 2021-01-21 DIAGNOSIS — G40834 Dravet syndrome, intractable, without status epilepticus: Secondary | ICD-10-CM | POA: Diagnosis not present

## 2021-01-28 DIAGNOSIS — G40834 Dravet syndrome, intractable, without status epilepticus: Secondary | ICD-10-CM | POA: Diagnosis not present

## 2021-02-04 DIAGNOSIS — Z23 Encounter for immunization: Secondary | ICD-10-CM | POA: Diagnosis not present

## 2021-02-21 DIAGNOSIS — G40834 Dravet syndrome, intractable, without status epilepticus: Secondary | ICD-10-CM | POA: Diagnosis not present

## 2021-02-26 ENCOUNTER — Encounter (HOSPITAL_BASED_OUTPATIENT_CLINIC_OR_DEPARTMENT_OTHER): Payer: Self-pay | Admitting: Emergency Medicine

## 2021-02-26 ENCOUNTER — Emergency Department (HOSPITAL_BASED_OUTPATIENT_CLINIC_OR_DEPARTMENT_OTHER): Payer: Medicare Other

## 2021-02-26 ENCOUNTER — Emergency Department (HOSPITAL_BASED_OUTPATIENT_CLINIC_OR_DEPARTMENT_OTHER)
Admission: EM | Admit: 2021-02-26 | Discharge: 2021-02-26 | Disposition: A | Discharge status: Home / Self Care | Payer: Medicare Other | Attending: Emergency Medicine | Admitting: Emergency Medicine

## 2021-02-26 ENCOUNTER — Other Ambulatory Visit: Payer: Self-pay

## 2021-02-26 DIAGNOSIS — S82234A Nondisplaced oblique fracture of shaft of right tibia, initial encounter for closed fracture: Secondary | ICD-10-CM | POA: Insufficient documentation

## 2021-02-26 DIAGNOSIS — S82891A Other fracture of right lower leg, initial encounter for closed fracture: Secondary | ICD-10-CM

## 2021-02-26 DIAGNOSIS — S82841A Displaced bimalleolar fracture of right lower leg, initial encounter for closed fracture: Secondary | ICD-10-CM | POA: Diagnosis not present

## 2021-02-26 DIAGNOSIS — S82434A Nondisplaced oblique fracture of shaft of right fibula, initial encounter for closed fracture: Secondary | ICD-10-CM | POA: Diagnosis not present

## 2021-02-26 DIAGNOSIS — M25571 Pain in right ankle and joints of right foot: Secondary | ICD-10-CM | POA: Diagnosis not present

## 2021-02-26 DIAGNOSIS — Y9301 Activity, walking, marching and hiking: Secondary | ICD-10-CM | POA: Diagnosis not present

## 2021-02-26 DIAGNOSIS — M7989 Other specified soft tissue disorders: Secondary | ICD-10-CM | POA: Diagnosis not present

## 2021-02-26 DIAGNOSIS — S99911A Unspecified injury of right ankle, initial encounter: Secondary | ICD-10-CM | POA: Diagnosis present

## 2021-02-26 DIAGNOSIS — X501XXA Overexertion from prolonged static or awkward postures, initial encounter: Secondary | ICD-10-CM | POA: Insufficient documentation

## 2021-02-26 MED ORDER — ACETAMINOPHEN 325 MG PO TABS
650.0000 mg | ORAL_TABLET | Freq: Once | ORAL | Status: AC
  Administered 2021-02-26: 650 mg via ORAL
  Filled 2021-02-26: qty 2

## 2021-02-26 NOTE — ED Provider Notes (Signed)
[facility] EMERGENCY DEPT Provider Note   CSN: [id number] Arrival date & time: 02/26/21  2004  LEVEL 5 CAVEAT - NONVERBAL  History Chief Complaint  Patient presents with   Ankle Pain    Molly Duffy is a 50 y.o. female.  HPI 50 year old female presents with family after twisting her leg.  She was walking up the stairs while family was holding onto her and she seemed to twist her right leg.  It is now swollen and she would not put weight on it.  No knee pain or injury.  No meds have been given.  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     History reviewed. No pertinent family history.  Social History   Tobacco Use   Smoking status: Never   Smokeless tobacco: Never  Substance Use Topics   Alcohol use: No    Home Medications Prior to Admission medications   Medication Sig Start Date End Date Taking? Authorizing Provider  BANZEL 200 MG tablet See admin instructions. Take 1 tablet in the morning and 2 tablets at night. 05/20/19   [provider]  divalproex (DEPAKOTE SPRINKLE) 125 MG capsule Take 125 mg by mouth AC breakfast. Take with the 250 mg Depakote to equal 375 mg TID    [provider]  divalproex (DEPAKOTE) 250 MG DR tablet Take 250 mg by mouth at  bedtime.    [provider]  LORazepam (ATIVAN) 1 MG tablet Take 1 mg by mouth every 8 (eight) hours. One or two for seizures as needed.    [provider]  lorazepam (ATIVAN) 4 MG/ML injection 0.5 ml ( 2 mg) in one nostril prn prolonged seizure 12/06/11   Milas Gain, MD  midazolam (VERSED) 50 MG/10ML SOLN injection See admin instructions. Use 1 mL per nostril for seizures lasting longer than 5 mins 06/09/19   [provider]  Multiple Vitamin (MULTIVITAMIN) tablet Take 1 tablet by mouth daily.    [provider]  nitrofurantoin (MACRODANTIN) 50 MG capsule Take 50 mg by mouth daily. 06/07/19   [provider]  ZONEGRAN 100 MG capsule See admin instructions. TAKE 1 CAPSULE AT NIGHT, WITH 3 CAPSULES OF 25 MG AT NIGHT FOR TOTAL DOSE OF 175MG  06/06/19   [provider]  zonisamide (ZONEGRAN) 25 MG capsule Take 75 mg by mouth daily.    [provider]    Allergies    Levetiracetam, Vigabatrin, Levetiracetam, Phenobarbital, Phenytoin sodium extended, Tiagabine hcl, and Tiagabine hcl  Review of Systems   Review of Systems  Unable to perform ROS: Patient nonverbal   Physical Exam Updated Vital Signs BP 129/62 (BP Location: Right Arm)   Pulse 64   Temp 98.4 F (36.9 C) (Oral)  Resp 18   Ht 5\' 6"  (1.676 m)   Wt 57.6 kg   SpO2 100%   BMI 20.50 kg/m   Physical Exam Vitals and nursing note reviewed.  Constitutional:      Appearance: She is well-developed.  HENT:     Head: Normocephalic and atraumatic.     Right Ear: External ear normal.     Left Ear: External ear normal.     Nose: Nose normal.  Eyes:     General:        Right eye: No discharge.        Left eye: No discharge.  Cardiovascular:     Rate and Rhythm: Normal rate and regular rhythm.     Pulses:          Posterior tibial pulses are detected w/ Doppler on the right side.  Pulmonary:     Effort: Pulmonary effort is normal.  Abdominal:     General: There is  no distension.  Musculoskeletal:     Right knee: Normal range of motion. No tenderness.     Right lower leg: Tenderness present.     Right ankle: Swelling present. Tenderness present over the lateral malleolus. Decreased range of motion.     Right foot: No tenderness.  Skin:    General: Skin is warm and dry.  Neurological:     Mental Status: She is alert.  Psychiatric:        Mood and Affect: Mood is not anxious.    ED Results / Procedures / Treatments   Labs (all labs ordered are listed, but only abnormal results are displayed) Labs Reviewed - No data to display  EKG None  Radiology DG Tibia/Fibula Right  Result Date: 02/26/2021 CLINICAL DATA:  Fall. EXAM: RIGHT ANKLE - COMPLETE 3+ VIEW; RIGHT TIBIA AND FIBULA - 2 VIEW COMPARISON:  None. FINDINGS: Right ankle: There is an acute oblique nondisplaced fractures of the distal fibula just above the level of the ankle mortise. There is an acute oblique fractures through the distal tibia which appears nondisplaced. This may extend to the articulating surface at the tibial talar joint. Joint spaces are maintained. There is no dislocation. There is soft tissue swelling surrounding the ankle. Right tibia and fibula: There are no additional fractures of the right tibia/fibula. No dislocation. Soft tissues within normal limits. IMPRESSION: 1. Acute nondisplaced fracture of the distal fibula. 2. Acute nondisplaced fracture of the distal tibia with intra-articular extension. Electronically Signed   By: 05-13-1969 M.D.   On: 02/26/2021 22:19   DG Ankle Complete Right  Result Date: 02/26/2021 CLINICAL DATA:  Fall. EXAM: RIGHT ANKLE - COMPLETE 3+ VIEW; RIGHT TIBIA AND FIBULA - 2 VIEW COMPARISON:  None. FINDINGS: Right ankle: There is an acute oblique nondisplaced fractures of the distal fibula just above the level of the ankle mortise. There is an acute oblique fractures through the distal tibia which appears nondisplaced. This may extend to the  articulating surface at the tibial talar joint. Joint spaces are maintained. There is no dislocation. There is soft tissue swelling surrounding the ankle. Right tibia and fibula: There are no additional fractures of the right tibia/fibula. No dislocation. Soft tissues within normal limits. IMPRESSION: 1. Acute nondisplaced fracture of the distal fibula. 2. Acute nondisplaced fracture of the distal tibia with intra-articular extension. Electronically Signed   By: 05-13[birth year]1970 M.D.   On: 02/26/2021 22:19    Procedures Procedures   Medications Ordered in ED Medications  acetaminophen (TYLENOL)  tablet 650 mg (650 mg Oral Given 02/26/21 2200)    ED Course  I have reviewed the triage vital signs and the nursing notes.  Pertinent labs & imaging results that were available during my care of the patient were reviewed by me and considered in my medical decision making (see chart for details).    MDM Rules/Calculators/A&P                           Patient's x-rays have been personally reviewed.  They show nondisplaced fractures of both malleoli.  Has good pulse.  Will place in splint and have her follow-up with her orthopedist, Dr. Magnus Ivan.  No displacement to need reduction.  Family feels like Tylenol strong enough for her for pain.  We will splint and discharge.  Nonweightbearing. Final Clinical Impression(s) / ED Diagnoses Final diagnoses:  Closed fracture of right ankle, initial encounter    Rx / DC Orders ED Discharge Orders          Ordered    For home use only DME Other see comment       Comments: Hoyer lift   02/26/21 2230             Pricilla Loveless, MD 02/26/21 2302

## 2021-02-26 NOTE — ED Notes (Signed)
Pt verbalizes understanding of discharge instructions. Opportunity for questioning and answers were provided. Armand removed by staff, pt discharged from ED to home. Educated to f/u with Ortho. Pt wheeled out in personal wheelchair.

## 2021-02-26 NOTE — ED Triage Notes (Addendum)
Pt presents to ED POV w/ mother. Per mother pt was walking upstairs and hit ankle. mother was holding pt's harness so she did not have full fall.

## 2021-02-28 ENCOUNTER — Telehealth: Payer: Self-pay | Admitting: Orthopaedic Surgery

## 2021-02-28 NOTE — Telephone Encounter (Signed)
Called and r/s to Thursday

## 2021-02-28 NOTE — Telephone Encounter (Signed)
Should I work in?

## 2021-02-28 NOTE — Telephone Encounter (Signed)
Pt mother called and states pt was seen Saturday for her right ankle and it is fractured. Mother would like to know if she could possibly get in sooner than tomorrow. With her daughter condition it hard for her daughter to wait that long.   CB [phone]

## 2021-03-01 ENCOUNTER — Ambulatory Visit: Payer: Medicare Other | Admitting: Orthopaedic Surgery

## 2021-03-03 ENCOUNTER — Other Ambulatory Visit: Payer: Self-pay

## 2021-03-03 ENCOUNTER — Ambulatory Visit: Payer: Medicare Other | Admitting: Orthopaedic Surgery

## 2021-03-03 ENCOUNTER — Encounter: Payer: Self-pay | Admitting: Orthopaedic Surgery

## 2021-03-03 ENCOUNTER — Ambulatory Visit (INDEPENDENT_AMBULATORY_CARE_PROVIDER_SITE_OTHER): Payer: Medicare Other | Admitting: Orthopaedic Surgery

## 2021-03-03 ENCOUNTER — Ambulatory Visit (INDEPENDENT_AMBULATORY_CARE_PROVIDER_SITE_OTHER): Payer: Medicare Other

## 2021-03-03 DIAGNOSIS — S82891A Other fracture of right lower leg, initial encounter for closed fracture: Secondary | ICD-10-CM

## 2021-03-03 DIAGNOSIS — S82871D Displaced pilon fracture of right tibia, subsequent encounter for closed fracture with routine healing: Secondary | ICD-10-CM

## 2021-03-03 MED ORDER — HYDROCODONE-ACETAMINOPHEN 5-325 MG PO TABS: 1.0000 | ORAL_TABLET | Freq: Four times a day (QID) | ORAL | 0 refills | Status: DC | PRN

## 2021-03-03 NOTE — Progress Notes (Signed)
The patient is a 50 year old female actually very well-known to me.  We have treated a left tibia fracture in her before nonoperative.  She does have seizure disorder and significant osteoporosis.  She unfortunate had a mechanical fall recently injuring her right ankle.  She sustained a bimalleolar equivalent fracture almost like a pilon fracture and was placed appropriate in a splint and given follow-up in our office.  Her mother and father who are her caregivers are with her today and I have interacted with them before.  They are very good with taking care of their daughter.  I did remove the splint from her right lower extremity.  She has bruising and swelling but the ankle is well located clinically.  3 views of the right ankle today show osteopenic bone and a fracture of the medial and lateral malleolus that is well aligned.  Obviously prefer nonoperative treatment given her osteopenic bone and her other comorbidities.  I feel that it is appropriate to place her in a well-padded cast today.  We will have this cast on for 2 weeks with nonweightbearing.  I would like to see her back in 2 weeks and have his cast removed and repeat 3 views of her right ankle.  We will then recast her for another 2 to 4 weeks before proceeding with a cam walking boot.  She will likely need to be nonweightbearing for a minimum of 6 weeks.  I will send in some hydrocodone as needed as well.  All question concerns were answered and addressed.

## 2021-03-12 DIAGNOSIS — R300 Dysuria: Secondary | ICD-10-CM | POA: Diagnosis not present

## 2021-03-21 ENCOUNTER — Ambulatory Visit (INDEPENDENT_AMBULATORY_CARE_PROVIDER_SITE_OTHER): Payer: Medicare Other

## 2021-03-21 ENCOUNTER — Encounter: Payer: Self-pay | Admitting: Orthopaedic Surgery

## 2021-03-21 ENCOUNTER — Ambulatory Visit (INDEPENDENT_AMBULATORY_CARE_PROVIDER_SITE_OTHER): Payer: Medicare Other | Admitting: Orthopaedic Surgery

## 2021-03-21 DIAGNOSIS — S82891D Other fracture of right lower leg, subsequent encounter for closed fracture with routine healing: Secondary | ICD-10-CM | POA: Diagnosis not present

## 2021-03-21 DIAGNOSIS — S82891A Other fracture of right lower leg, initial encounter for closed fracture: Secondary | ICD-10-CM

## 2021-03-21 NOTE — Progress Notes (Signed)
The patient is just over 2 weeks status post injury to her right ankle which she sustained fractures of the distal tibia at the medial malleolus and the lateral malleolus.  She has osteopenic bone.  She does have other comorbidities that have made for osteopenic bone and limited mobility.  Her parents are with her and the primary caregivers.  We treated a left ankle fracture nonoperative sometime ago and she did well with that.  We are trying to treat this nonoperatively as well.  It lines up pretty good and that we had in a cast for last 2 weeks.  She is being seen today for repeat evaluation out of the cast.  There is still moderate ankle swelling but it is less than what it was 2 weeks ago.  She still having appropriate pain medial and lateral.  Clinically the ankle is well located and the leg appears congruent.  3 views of the right ankle show that the ankle mortise is intact.  The bone is osteopenic.  The fracture lines are visible but have not displaced.  Will put her back in a short leg cast today with nonweightbearing for the next 4 weeks.  We will see her back in 4 weeks we will have that cast removed and hopefully can put her in a cam walking boot after that visit.  We will get a repeat 3 views of the right ankle out of the cast at that visit.

## 2021-03-31 DIAGNOSIS — M810 Age-related osteoporosis without current pathological fracture: Secondary | ICD-10-CM | POA: Diagnosis not present

## 2021-03-31 DIAGNOSIS — Z79899 Other long term (current) drug therapy: Secondary | ICD-10-CM | POA: Diagnosis not present

## 2021-03-31 DIAGNOSIS — G40219 Localization-related (focal) (partial) symptomatic epilepsy and epileptic syndromes with complex partial seizures, intractable, without status epilepticus: Secondary | ICD-10-CM | POA: Diagnosis not present

## 2021-03-31 DIAGNOSIS — G40834 Dravet syndrome, intractable, without status epilepticus: Secondary | ICD-10-CM | POA: Diagnosis not present

## 2021-04-02 DIAGNOSIS — N390 Urinary tract infection, site not specified: Secondary | ICD-10-CM | POA: Diagnosis not present

## 2021-04-15 ENCOUNTER — Telehealth: Payer: Self-pay

## 2021-04-15 NOTE — Telephone Encounter (Signed)
Debbie pts mom called regarding her upcoming apt with [facility]. Eunice Blase is very upset stating that she did not know that the apt was with [facility] she stated she was under the impression that it was with Dr. Magnus Ivan whom they always see. She would like to still come in but see Dr. Magnus Ivan instead of [facility] she stated this was a scheduling error on our part and would like a call back.

## 2021-04-15 NOTE — Telephone Encounter (Signed)
Moved over to Dr.Blackmans schedule. Mom advised

## 2021-04-20 ENCOUNTER — Ambulatory Visit (INDEPENDENT_AMBULATORY_CARE_PROVIDER_SITE_OTHER): Payer: Medicare Other

## 2021-04-20 ENCOUNTER — Encounter: Payer: Self-pay | Admitting: Orthopaedic Surgery

## 2021-04-20 ENCOUNTER — Ambulatory Visit (INDEPENDENT_AMBULATORY_CARE_PROVIDER_SITE_OTHER): Payer: Medicare Other | Admitting: Orthopaedic Surgery

## 2021-04-20 ENCOUNTER — Ambulatory Visit: Payer: Medicare Other | Admitting: Physician Assistant

## 2021-04-20 DIAGNOSIS — S82891D Other fracture of right lower leg, subsequent encounter for closed fracture with routine healing: Secondary | ICD-10-CM

## 2021-04-20 NOTE — Progress Notes (Signed)
The patient is now 8 weeks into a left ankle injury involving fractures of the lateral malleolus and the tibial metaphysis.  She has been in a cast.  We have changed the cast at least once.  She is someone who has cognitive issues and this does affect her mobility.  She has been compliant with nonweightbearing.  Her parents are with her and her and are very involved with her care fortunately.  On exam her ankle looks still swollen but much less swollen before.  It is clinically well located and she does not seem to be in significant pain.  3 views of her right ankle show the fracture is almost completely healed.  The bone is osteopenic but the ankle mortise and alignment overall appear anatomic.  We will have her in a cam walking boot now.  She can attempt weight-bear as tolerated in the boot.  We will see her back in 4 weeks for final 3 views of her right ankle.  All questions and concerns were answered and addressed.

## 2021-04-22 DIAGNOSIS — R300 Dysuria: Secondary | ICD-10-CM | POA: Diagnosis not present

## 2021-04-22 DIAGNOSIS — N390 Urinary tract infection, site not specified: Secondary | ICD-10-CM | POA: Diagnosis not present

## 2021-04-22 DIAGNOSIS — R509 Fever, unspecified: Secondary | ICD-10-CM | POA: Diagnosis not present

## 2021-04-29 DIAGNOSIS — N390 Urinary tract infection, site not specified: Secondary | ICD-10-CM | POA: Diagnosis not present

## 2021-04-29 DIAGNOSIS — N200 Calculus of kidney: Secondary | ICD-10-CM | POA: Diagnosis not present

## 2021-04-29 DIAGNOSIS — R509 Fever, unspecified: Secondary | ICD-10-CM | POA: Diagnosis not present

## 2021-05-02 ENCOUNTER — Other Ambulatory Visit: Payer: Self-pay

## 2021-05-02 ENCOUNTER — Encounter: Payer: Self-pay | Admitting: Urology

## 2021-05-02 ENCOUNTER — Ambulatory Visit (INDEPENDENT_AMBULATORY_CARE_PROVIDER_SITE_OTHER): Payer: Medicare Other | Admitting: Urology

## 2021-05-02 VITALS — BP 118/65 | HR 84 | Wt 127.0 lb

## 2021-05-02 DIAGNOSIS — N200 Calculus of kidney: Secondary | ICD-10-CM

## 2021-05-02 DIAGNOSIS — R310 Gross hematuria: Secondary | ICD-10-CM | POA: Diagnosis not present

## 2021-05-02 DIAGNOSIS — R350 Frequency of micturition: Secondary | ICD-10-CM | POA: Diagnosis not present

## 2021-05-02 LAB — URINALYSIS, ROUTINE W REFLEX MICROSCOPIC
Bilirubin, UA: NEGATIVE
Glucose, UA: NEGATIVE
Ketones, UA: NEGATIVE
Nitrite, UA: NEGATIVE
Protein,UA: NEGATIVE
RBC, UA: NEGATIVE
Specific Gravity, UA: 1.02 (ref 1.005–1.030)
Urobilinogen, Ur: 0.2 mg/dL (ref 0.2–1.0)
pH, UA: 7 (ref 5.0–7.5)

## 2021-05-02 LAB — MICROSCOPIC EXAMINATION
RBC, Urine: NONE SEEN /hpf (ref 0–2)
Renal Epithel, UA: NONE SEEN /hpf

## 2021-05-02 NOTE — Progress Notes (Signed)
05/02/2021 2:36 PM   Molly Duffy 1970/05/11 [id number]  Referring provider: Curlene Labrum, MD [place],  [place] [address]  No chief complaint on file.   HPI: Ms. Molly Duffy is a 51 year old female with Dravet's Syndrome and severe epilepsy who presents with her parents for evaluation of recurrent urinary tract infections and urinary frequency. The pt has also had occasional gross hematuria which has not been worked up except for recent renal US which was read as normal except for B non-obstructing stones. Patient's history is obtained from her mother who is present with her in the exam room. Currently, the patient has no complaints of dysuria or burning, but her mother states she is unable to verbally convey pain or discomfort.  Patient's urinary symptoms have been going on for many years in the patient has been diagnosed with multiple culture positive urinary tract infections. Symptoms might include bloody urine, pus in urine and fever. Other times she has foul smelling urine. Last year, she did well for approximately 6 months on an HS dose of Macrodantin, but had to discontinue nightly therapy secondary to severe thrombocytopenia. She continues Macrodantin at night approximately 3 days/week. The pt wears pull ups during the day and at night. She has frequent incontinence mostly related to seizure activity. The pt conveys that she does not like to drink fluids and her mother states pt may drink only 4 cups of liquids during the day. The pt also takes daily Zonegran also putting her at risk for stone formation.  No current fever, chills, nausea, vomiting.  Actively, she is less ambulatory than usual due to recent right lower extremity fractures.  She does have a fracture sprain orthosis.  The patient went through menopause at age 60 and has been prescribed estrogen cream, but it has not been used consistently. Urethral dilations in distant past were effective. No h/o OAB med use. PVR 0 mL  today Urinalysis 0-5 WBCs. 0RBCs and few bacteria. nitrite negative   04/30/21 Renal US shows a 50mm stone on the right (no shadowing) and 43mm stone on the left (shadowing). No evidence of obstruction or hydroureteronephrosis.  Bladder distention felt to be normal.  Cultures: 03/12/2021-Klebsiella pneumonia resistant to Macrodantin ampicillin 01/10/2021-E. coli-pansensitive   Medical records reviewed including inpatient and outpt tx.  PMH: Past Medical History:  Diagnosis Date   Developmental delay    Osteoporosis    Seizures ([place])    Symptomatic generalized epilepsy ([place]) 10/1971    Surgical History: Past Surgical History:  Procedure Laterality Date   dental implants     vns implant  2001   has been turned off    Home Medications:  Allergies as of 05/02/2021       Reactions   Levetiracetam Other (See Comments)   Makes seizures worse Seizures worse.   Vigabatrin Other (See Comments)   Nonconvulsive status.    Levetiracetam    Makes seizures worse   Phenobarbital Nausea And Vomiting   Makes seizures worse. Adverse effects   Phenytoin Sodium Extended    Tiagabine Hcl    Makes seizures worse   Tiagabine Hcl Other (See Comments)   Makes seizures worse        Medication List        Accurate as of May 02, 2021  2:36 PM. If you have any questions, ask your nurse or doctor.          Banzel 200 MG tablet Generic drug: rufinamide See admin instructions.  Take 1 tablet in the morning and 2 tablets at night.   divalproex 250 MG DR tablet Commonly known as: DEPAKOTE Take 250 mg by mouth at bedtime.   divalproex 125 MG capsule Commonly known as: DEPAKOTE SPRINKLE Take 125 mg by mouth AC breakfast. Take with the 250 mg Depakote to equal 375 mg TID   HYDROcodone-acetaminophen 5-325 MG tablet Commonly known as: NORCO/VICODIN Take 1 tablet by mouth every 6 (six) hours as needed for moderate pain.   LORazepam 1 MG tablet Commonly known as: ATIVAN Take 1 mg  by mouth every 8 (eight) hours. One or two for seizures as needed.   lorazepam 4 MG/ML injection Commonly known as: ATIVAN 0.5 ml ( 2 mg) in one nostril prn prolonged seizure   midazolam 50 MG/10ML Soln injection Commonly known as: VERSED See admin instructions. Use 1 mL per nostril for seizures lasting longer than 5 mins   multivitamin tablet Take 1 tablet by mouth daily.   nitrofurantoin 50 MG capsule Commonly known as: MACRODANTIN Take 50 mg by mouth daily.   zonisamide 25 MG capsule Commonly known as: ZONEGRAN Take 75 mg by mouth daily.   Zonegran 100 MG capsule Generic drug: zonisamide See admin instructions. TAKE 1 CAPSULE AT NIGHT, WITH 3 CAPSULES OF 25 MG AT NIGHT FOR TOTAL DOSE OF 175MG         Allergies:  Allergies  Allergen Reactions   Levetiracetam Other (See Comments)    Makes seizures worse Seizures worse.   Vigabatrin Other (See Comments)    Nonconvulsive status.    Levetiracetam     Makes seizures worse   Phenobarbital Nausea And Vomiting    Makes seizures worse. Adverse effects   Phenytoin Sodium Extended    Tiagabine Hcl     Makes seizures worse   Tiagabine Hcl Other (See Comments)    Makes seizures worse    Family History: No family history on file.  Social History:  reports that she has never smoked. She has never used smokeless tobacco. She reports that she does not drink alcohol. No history on file for drug use.   Physical Exam: BP 118/65    Pulse 84    Wt 127 lb (57.6 kg)    BMI 20.50 kg/m   Constitutional:  Alert and oriented, No acute distress. HEENT: [place] AT, moist mucus membranes.  Trachea midline, no masses. Cardiovascular: No clubbing, cyanosis, or edema. Respiratory: Normal respiratory effort, no increased work of breathing.  Lungs clear to auscultation bilaterally GI: Abdomen is soft, nontender, nondistended, no abdominal masses GU: No CVA tenderness MK: The patient is in a wheelchair today and is wearing a fracture sprain  orthosis on the right lower extremity. Skin: No rashes, bruises or suspicious lesions. Neurologic: Patient is nonverbal moves all 4 extremities. Psychiatric: Normal mood and affect.  Laboratory Data: No results found for: WBC, HGB, HCT, MCV, PLT    Urinalysis    Component Value Date/Time   APPEARANCEUR Clear 05/02/2021 1352   GLUCOSEU Negative 05/02/2021 1352   BILIRUBINUR Negative 05/02/2021 1352   PROTEINUR Negative 05/02/2021 1352   NITRITE Negative 05/02/2021 1352   LEUKOCYTESUR Trace (A) 05/02/2021 1352    Lab Results  Component Value Date   LABMICR See below: 05/02/2021   WBCUA 0-5 05/02/2021   LABEPIT 0-10 05/02/2021   BACTERIA Few (A) 05/02/2021    Pertinent Imaging: Renal US as noted above -I reviewed all the images on canopy.  Assessment & Plan:    1. Urine frequency  FU for cystoscopy at first available appt given the prior gross hematuria.  Also had a long discussion about the difference between bacteriuria and urinary tract infection.  I think we have a pretty good understanding but we discussed if she is asymptomatic and not having any fever or bladder pain urine odor is just a sign of bacteria and a good time to increase the fluids.  She would not need antibiotics at this point.  Good fluid intake and voiding will help prevent her from getting to the point to where she has hematuria and pyocystis.  Her post void today was normal.  If she is drinking more water and continues to have frequency we discussed possible treatment with anticholinergics, beta 3 agonists, PTNS or Botox.  In other words we do not want frequency to cause her to limit her fluids.  They will look for other fluids which she can drink. - Urinalysis, Routine w reflex microscopic - Urine Culture  2. Bilateral nephrolithiasis Currently non-obstructing. Will Follow.   3. Gross hematuria -renal ultrasound was benign.  We will complete evaluation with cystoscopy.  Patient was seen and examined  with PA Summerlin.  No follow-ups on file.  Summerlin, Berneice Heinrich, PA-C  [facility] [place], [place] [address] [phone]

## 2021-05-02 NOTE — Progress Notes (Signed)
Urological Symptom Review  Patient is experiencing the following symptoms: Frequent urination Hard to postpone urination Burning/pain with urination Get up at night to urinate Leakage of urine Blood in urine Urinary tract infection   Review of Systems  Gastrointestinal (upper)  : Negative for upper GI symptoms  Gastrointestinal (lower) : Diarrhea  Constitutional : Negative for symptoms  Skin: Negative for skin symptoms  Eyes: Negative for eye symptoms  Ear/Nose/Throat : Negative for Ear/Nose/Throat symptoms  Hematologic/Lymphatic: Negative for Hematologic/Lymphatic symptoms  Cardiovascular : Negative for cardiovascular symptoms  Respiratory : Negative for respiratory symptoms  Endocrine: Negative for endocrine symptoms  Musculoskeletal: Negative for musculoskeletal symptoms  Neurological: Negative for neurological symptoms  Psychologic: Negative for psychiatric symptoms

## 2021-05-04 LAB — URINE CULTURE

## 2021-05-11 ENCOUNTER — Other Ambulatory Visit: Payer: Self-pay

## 2021-05-13 DIAGNOSIS — R008 Other abnormalities of heart beat: Secondary | ICD-10-CM | POA: Diagnosis not present

## 2021-05-13 DIAGNOSIS — G40219 Localization-related (focal) (partial) symptomatic epilepsy and epileptic syndromes with complex partial seizures, intractable, without status epilepticus: Secondary | ICD-10-CM | POA: Diagnosis not present

## 2021-05-15 DIAGNOSIS — G40309 Generalized idiopathic epilepsy and epileptic syndromes, not intractable, without status epilepticus: Secondary | ICD-10-CM | POA: Diagnosis not present

## 2021-05-15 DIAGNOSIS — R600 Localized edema: Secondary | ICD-10-CM | POA: Diagnosis not present

## 2021-05-15 DIAGNOSIS — M810 Age-related osteoporosis without current pathological fracture: Secondary | ICD-10-CM | POA: Diagnosis not present

## 2021-05-18 ENCOUNTER — Ambulatory Visit (INDEPENDENT_AMBULATORY_CARE_PROVIDER_SITE_OTHER): Payer: Medicare Other | Admitting: Orthopaedic Surgery

## 2021-05-18 ENCOUNTER — Ambulatory Visit (INDEPENDENT_AMBULATORY_CARE_PROVIDER_SITE_OTHER): Payer: Medicare Other

## 2021-05-18 ENCOUNTER — Encounter: Payer: Self-pay | Admitting: Orthopaedic Surgery

## 2021-05-18 ENCOUNTER — Other Ambulatory Visit: Payer: Self-pay

## 2021-05-18 DIAGNOSIS — S82891D Other fracture of right lower leg, subsequent encounter for closed fracture with routine healing: Secondary | ICD-10-CM

## 2021-05-18 NOTE — Progress Notes (Signed)
Molly Duffy is now almost 12 weeks into a right ankle fracture that was nondisplaced.  It involves the lateral malleolus and the distal tibia.  She has been in a cam walking boot since her last visit 4 weeks ago.  She does have some cognitive disabilities.  Her parents are very involved in her care and stated she does need some home therapy to work with her balance and coordination.  Her right ankle is moving much better overall.  X-rays of the right ankle show the fracture is healed completely and the ankle mortise is congruent.  We can transition her to an ASO at this standpoint.  I do feel home health therapy would be appropriate for this patient given her cognitive issues and we will only have them work on her balance and coordination with weightbearing as tolerated.  She does not have to have any type of strengthening or motion exercises it is more of just helping her with her mobility given her fall risk.

## 2021-05-24 IMAGING — CT CT PELVIS W/O CM
2 of 3 series · 16 of 46 positions shown, 18 images · non-contrast
Comparison: 09/21/2006, 06/11/2019

CLINICAL DATA: Fell from standing, left greater than right hip pain

EXAM:
CT PELVIS WITHOUT CONTRAST
TECHNIQUE: Multidetector CT imaging of the pelvis was performed following the
standard protocol without intravenous contrast.

[Series 3: axial st · axial · 0.68mm/px · z∈[-955,-711]mm · 13 of 142 slices shown, 15 images]
[im 10/142  soft-tissue]
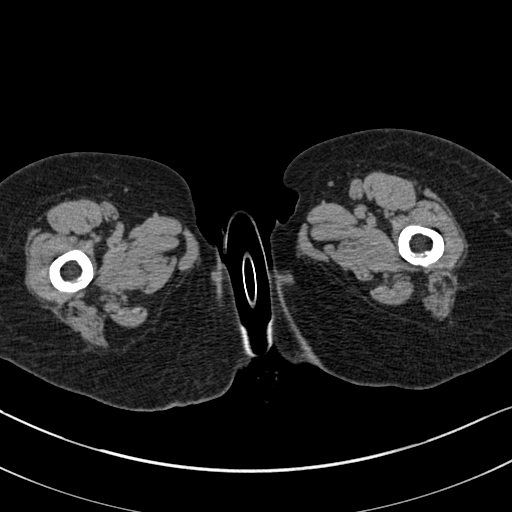
[im 10/142  bone]
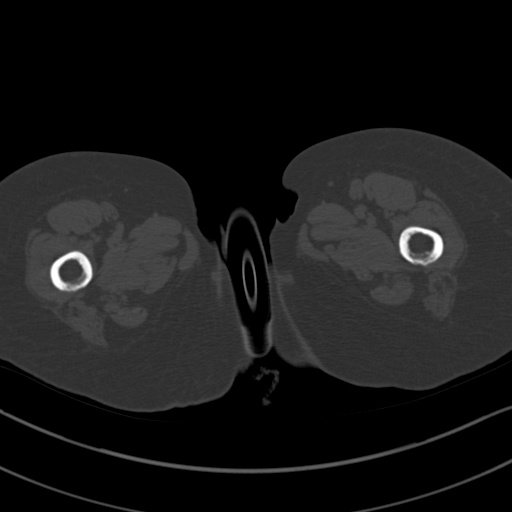
[im 19/142  soft-tissue]
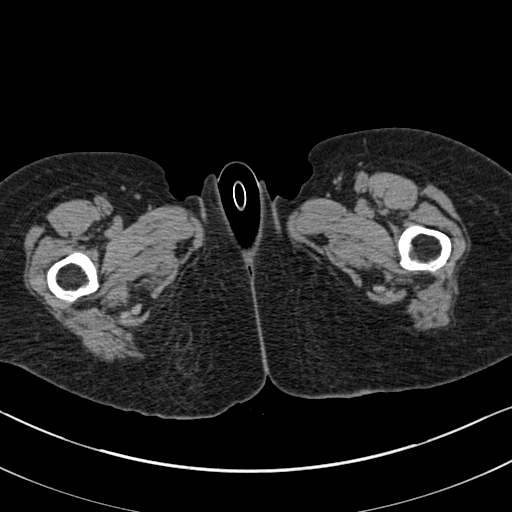
[im 28/142  soft-tissue]
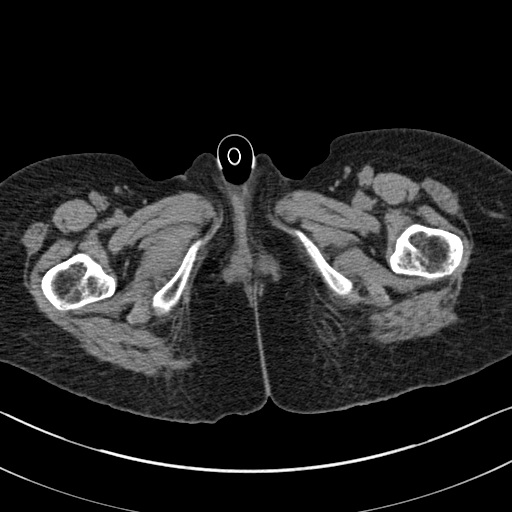
[im 41/142  soft-tissue]
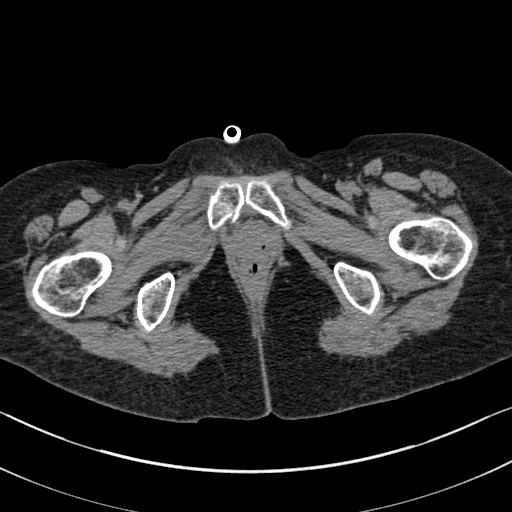
[im 51/142  soft-tissue]
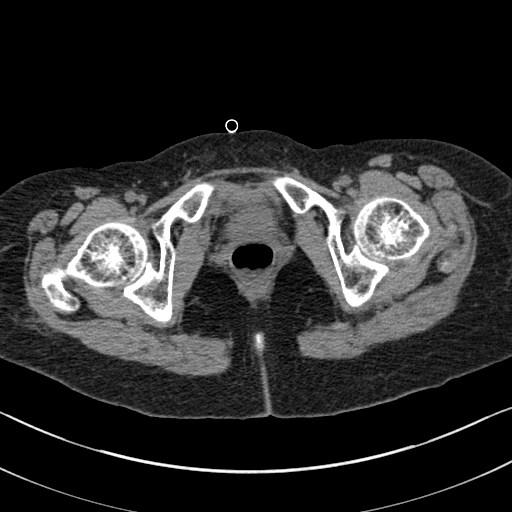
[im 60/142  soft-tissue]
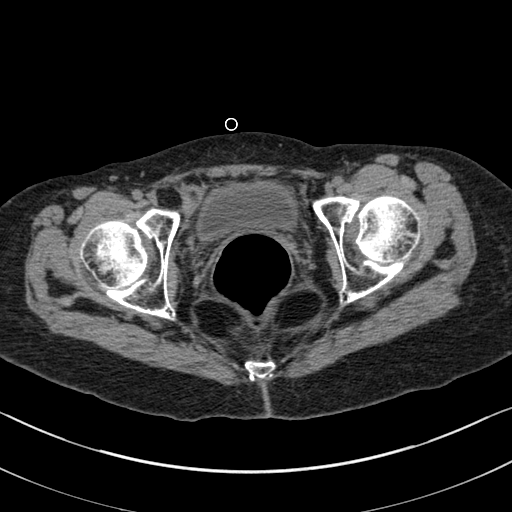
[im 73/142  soft-tissue]
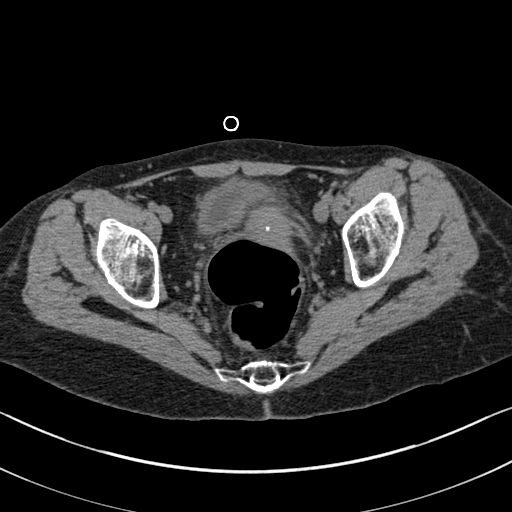
[im 82/142  soft-tissue]
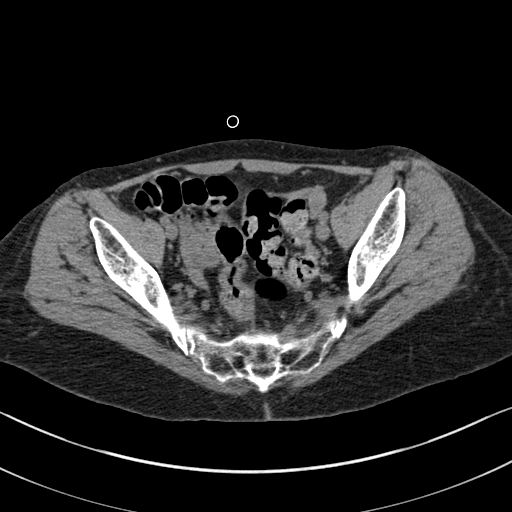
[im 91/142  soft-tissue]
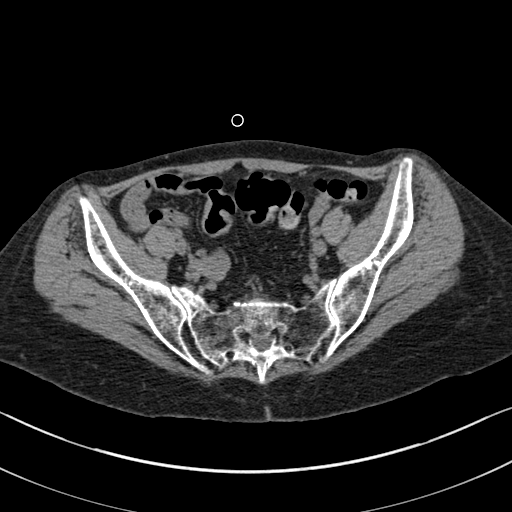
[im 91/142  bone]
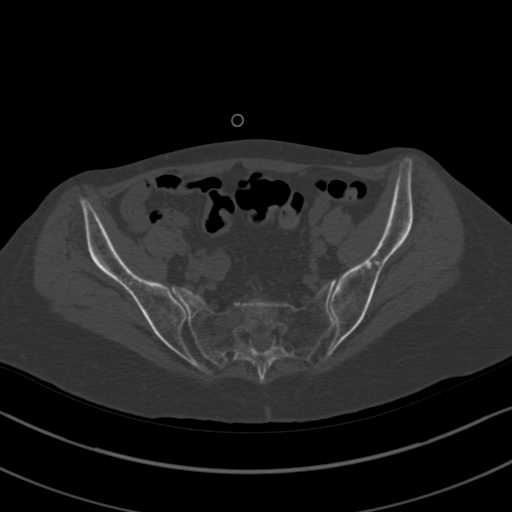
[im 101/142  soft-tissue]
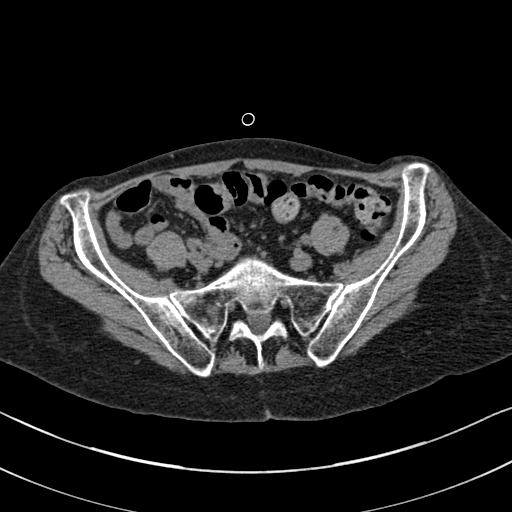
[im 114/142  soft-tissue]
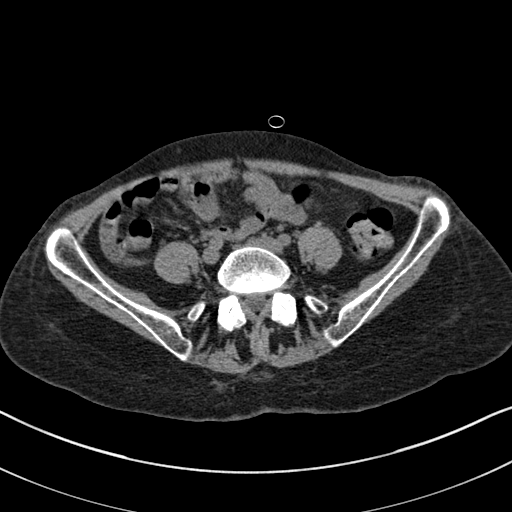
[im 123/142  soft-tissue]
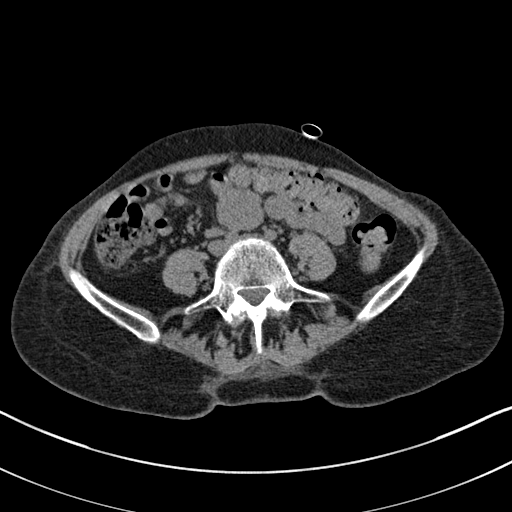
[im 132/142  soft-tissue]
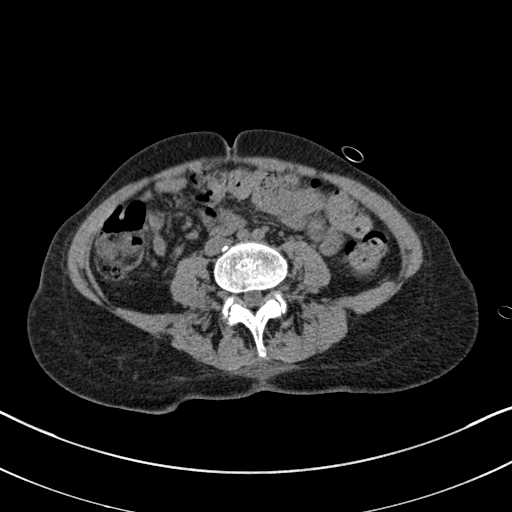

[Series 8: coronal st · coronal · 0.59mm/px · 3 of 100 slices shown]
[im 34/100  soft-tissue]
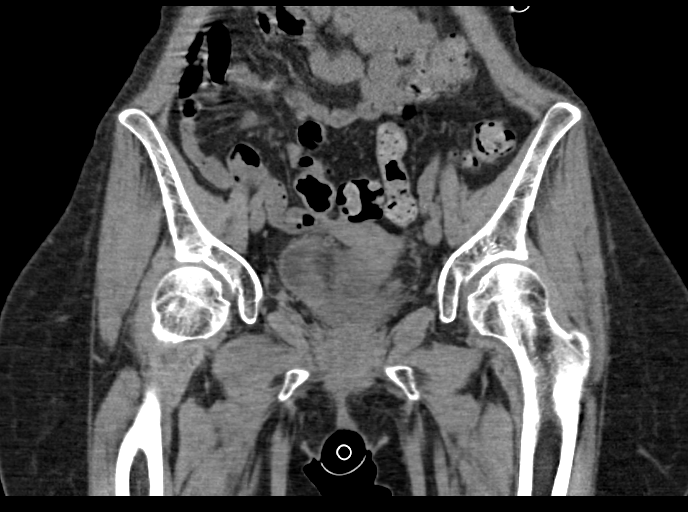
[im 45/100  soft-tissue]
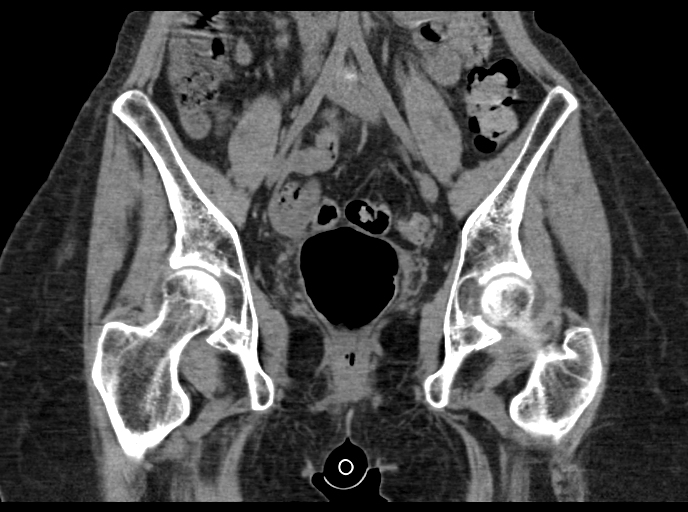
[im 56/100  soft-tissue]
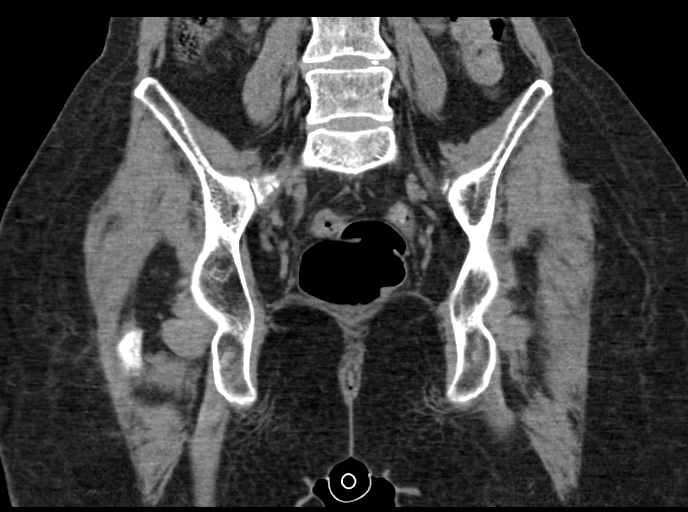

[16 of 46 positions shown; findings below may reference images not displayed]

FINDINGS: Urinary Tract: Bladder is decompressed. Distal ureters are
unremarkable.

Bowel: No bowel obstruction or ileus. No inflammatory changes.
Minimal retained stool. Normal appendix right lower quadrant.

Vascular/Lymphatic: No pathologically enlarged lymph nodes. No
significant vascular abnormality seen.

Reproductive:  No mass or other significant abnormality

Other:  No free fluid or free gas.

Musculoskeletal: Minimally displaced sagittally oriented fractures
seen through the right sacral ala. There is a subtle minimally
displaced fracture through the right-sided pubic symphysis as well.

No other acute displaced pelvic fractures. The hips are well
aligned. No evidence of joint effusion.

Reconstructed images demonstrate no additional findings.
IMPRESSION: 1. Subtle minimally displaced fractures through the right-sided
pubic symphysis and right sacral ala.
2. Unremarkable bilateral hips.

## 2021-05-24 IMAGING — CR DG LUMBAR SPINE COMPLETE 4+V
5 series · 5 of 5 positions shown · non-contrast
Comparison: None.

CLINICAL DATA: Fell, low back pain

EXAM:
LUMBAR SPINE - COMPLETE 4+ VIEW

[t lumbar spine ap]
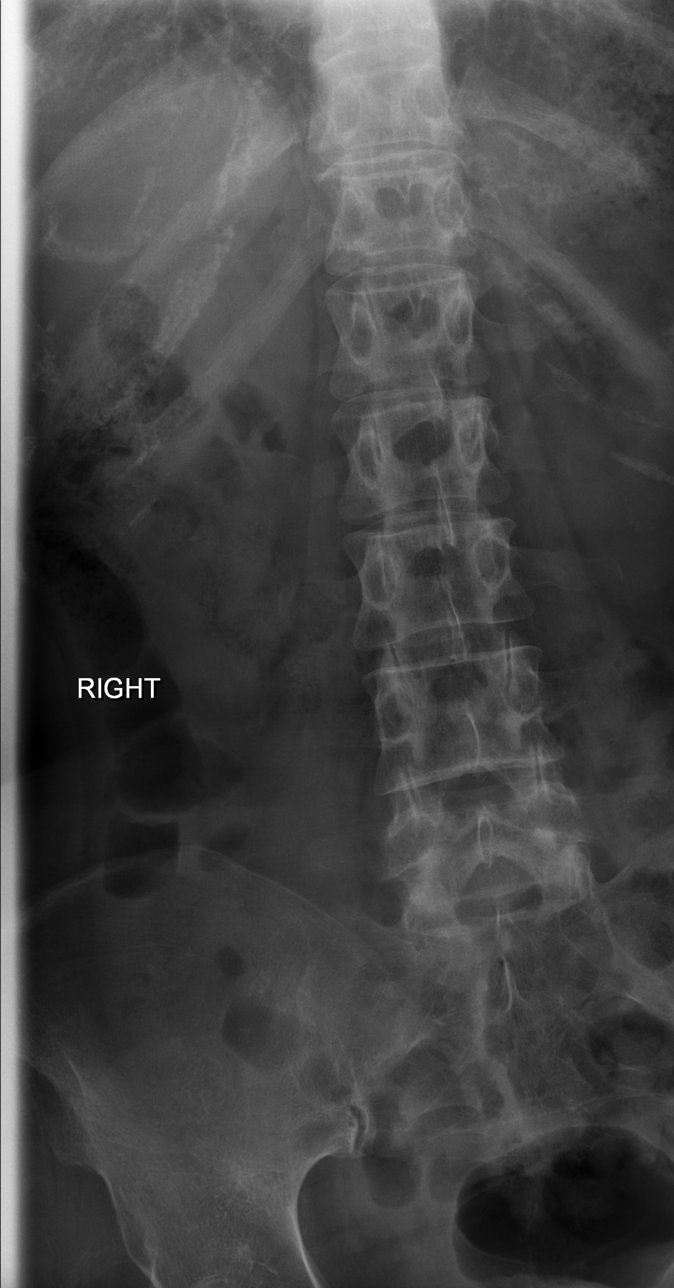

[t lumbar spine obl (1 of 2)]
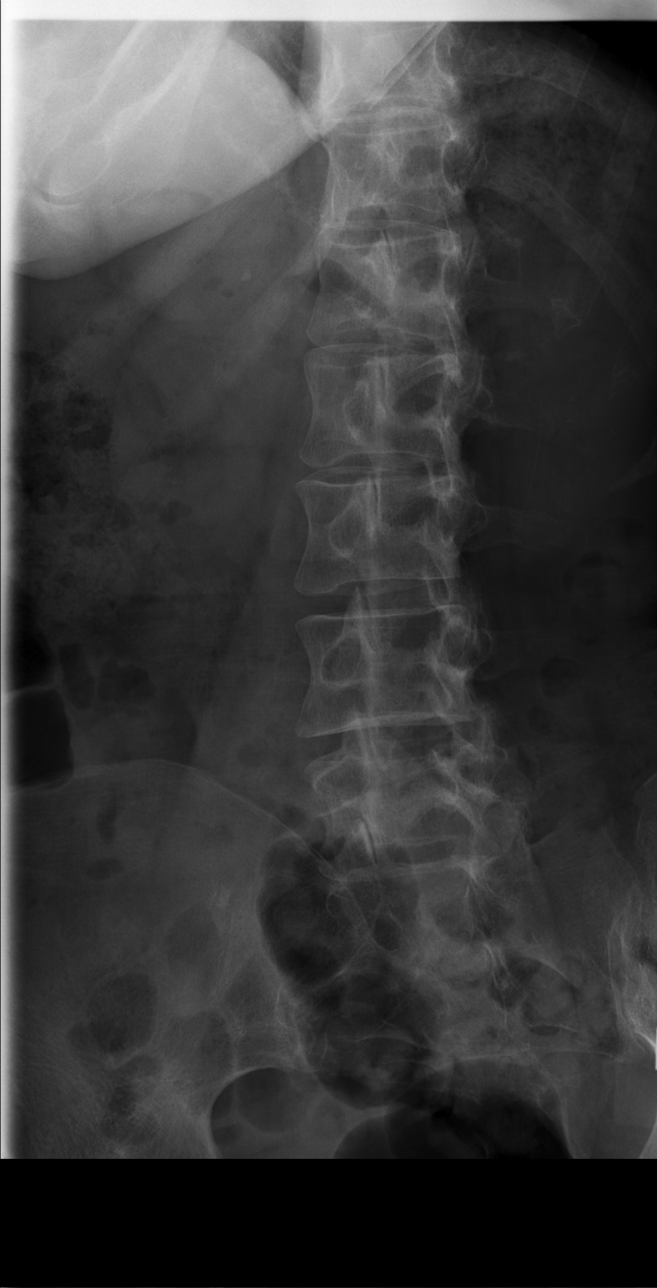

[t lumbar spine obl (2 of 2)]
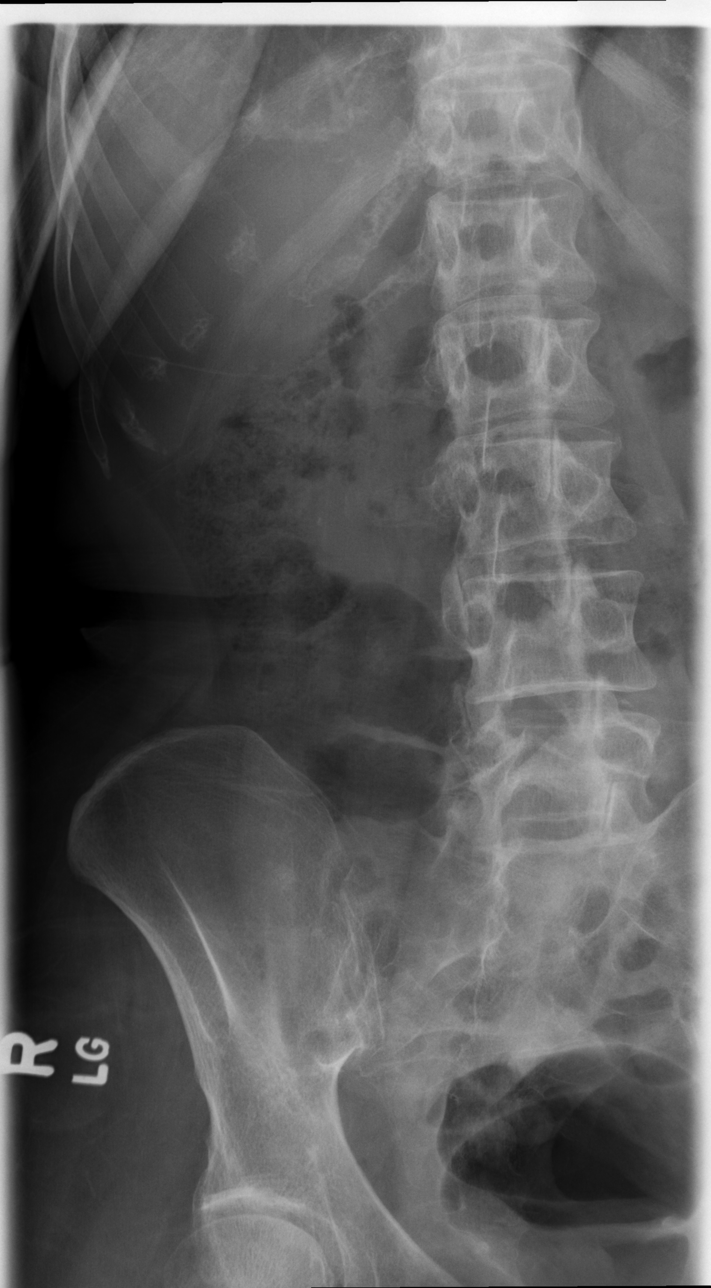

[w lumbar spine lat]
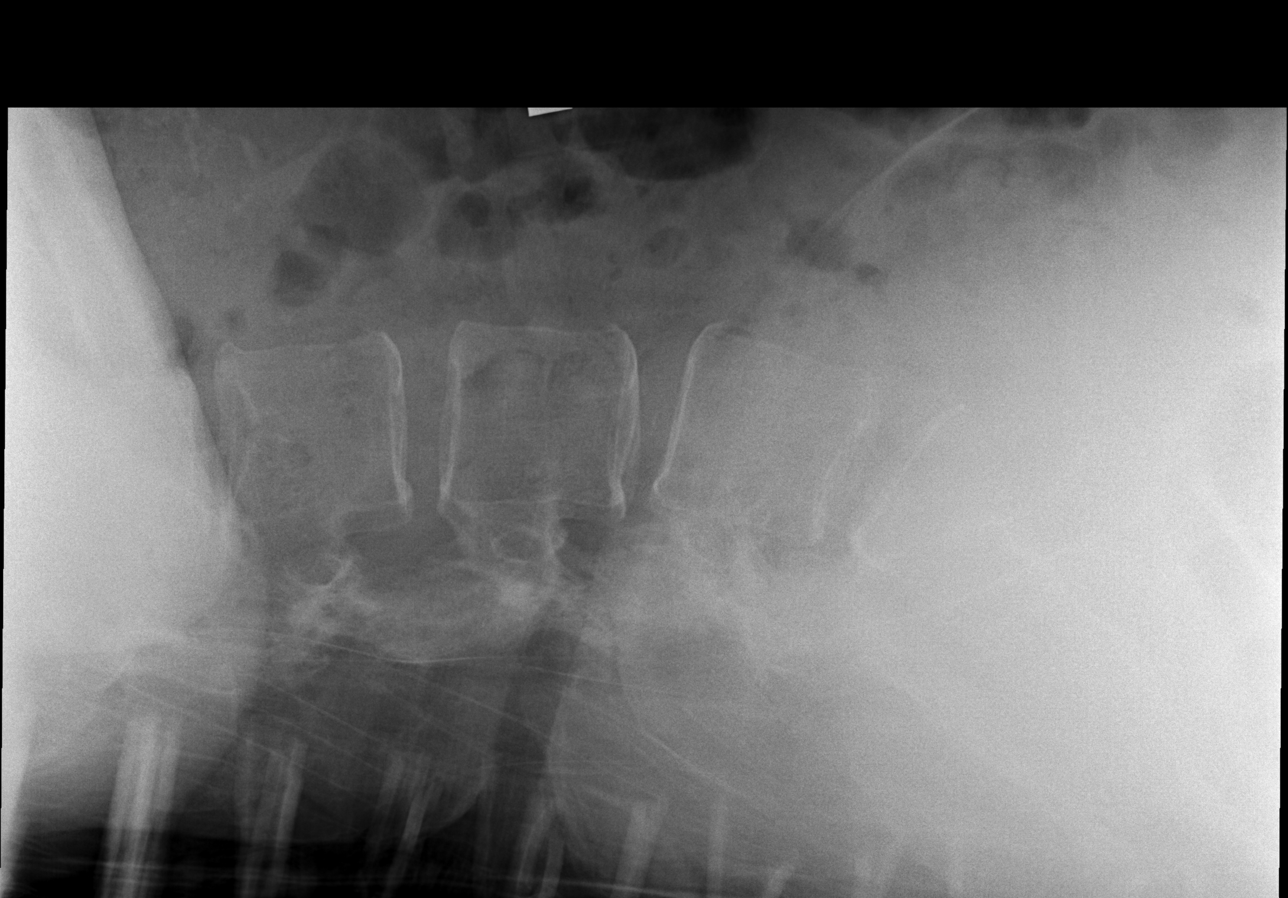

[w lumbar l-5 s-1 spot]
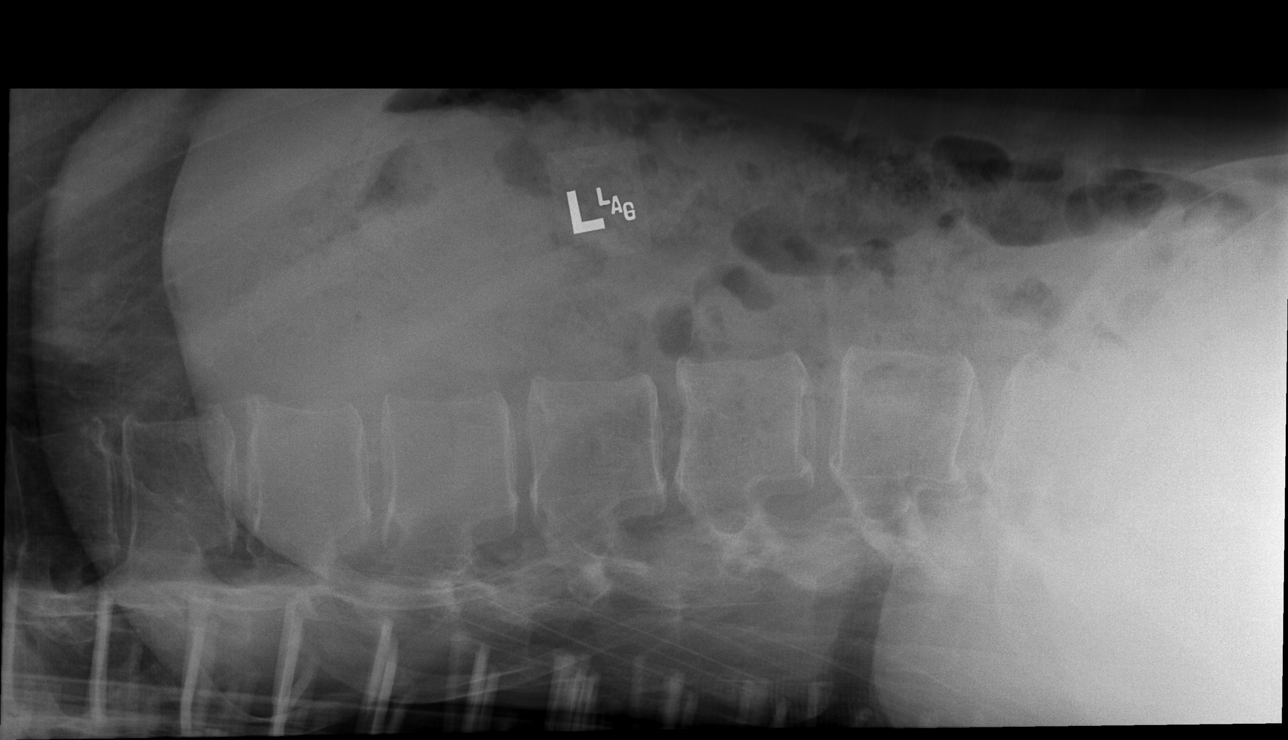

[5 of 5 positions shown; findings below may reference images not displayed]

FINDINGS: Frontal, bilateral oblique, and cross-table lateral views of the
lumbar spine are obtained. There are 5 non-rib-bearing lumbar type
vertebral bodies in anatomic alignment. No acute displaced fracture.
Disc spaces are relatively well preserved. Sacroiliac joints are
normal.
IMPRESSION: 1. Unremarkable lumbar spine.

## 2021-05-28 DIAGNOSIS — F809 Developmental disorder of speech and language, unspecified: Secondary | ICD-10-CM | POA: Diagnosis not present

## 2021-05-28 DIAGNOSIS — M80071D Age-related osteoporosis with current pathological fracture, right ankle and foot, subsequent encounter for fracture with routine healing: Secondary | ICD-10-CM | POA: Diagnosis not present

## 2021-05-28 DIAGNOSIS — G40834 Dravet syndrome, intractable, without status epilepticus: Secondary | ICD-10-CM | POA: Diagnosis not present

## 2021-05-28 DIAGNOSIS — G40409 Other generalized epilepsy and epileptic syndromes, not intractable, without status epilepticus: Secondary | ICD-10-CM | POA: Diagnosis not present

## 2021-06-03 DIAGNOSIS — F809 Developmental disorder of speech and language, unspecified: Secondary | ICD-10-CM | POA: Diagnosis not present

## 2021-06-03 DIAGNOSIS — M80071D Age-related osteoporosis with current pathological fracture, right ankle and foot, subsequent encounter for fracture with routine healing: Secondary | ICD-10-CM | POA: Diagnosis not present

## 2021-06-03 DIAGNOSIS — G40409 Other generalized epilepsy and epileptic syndromes, not intractable, without status epilepticus: Secondary | ICD-10-CM | POA: Diagnosis not present

## 2021-06-03 DIAGNOSIS — G40834 Dravet syndrome, intractable, without status epilepticus: Secondary | ICD-10-CM | POA: Diagnosis not present

## 2021-06-08 DIAGNOSIS — G40409 Other generalized epilepsy and epileptic syndromes, not intractable, without status epilepticus: Secondary | ICD-10-CM | POA: Diagnosis not present

## 2021-06-08 DIAGNOSIS — F809 Developmental disorder of speech and language, unspecified: Secondary | ICD-10-CM | POA: Diagnosis not present

## 2021-06-08 DIAGNOSIS — M80071D Age-related osteoporosis with current pathological fracture, right ankle and foot, subsequent encounter for fracture with routine healing: Secondary | ICD-10-CM | POA: Diagnosis not present

## 2021-06-08 DIAGNOSIS — G40834 Dravet syndrome, intractable, without status epilepticus: Secondary | ICD-10-CM | POA: Diagnosis not present

## 2021-06-10 DIAGNOSIS — F809 Developmental disorder of speech and language, unspecified: Secondary | ICD-10-CM | POA: Diagnosis not present

## 2021-06-10 DIAGNOSIS — G40409 Other generalized epilepsy and epileptic syndromes, not intractable, without status epilepticus: Secondary | ICD-10-CM | POA: Diagnosis not present

## 2021-06-10 DIAGNOSIS — M80071D Age-related osteoporosis with current pathological fracture, right ankle and foot, subsequent encounter for fracture with routine healing: Secondary | ICD-10-CM | POA: Diagnosis not present

## 2021-06-10 DIAGNOSIS — G40834 Dravet syndrome, intractable, without status epilepticus: Secondary | ICD-10-CM | POA: Diagnosis not present

## 2021-06-13 ENCOUNTER — Telehealth: Payer: Self-pay

## 2021-06-13 DIAGNOSIS — Z888 Allergy status to other drugs, medicaments and biological substances status: Secondary | ICD-10-CM | POA: Diagnosis not present

## 2021-06-13 DIAGNOSIS — D696 Thrombocytopenia, unspecified: Secondary | ICD-10-CM | POA: Diagnosis not present

## 2021-06-13 DIAGNOSIS — B965 Pseudomonas (aeruginosa) (mallei) (pseudomallei) as the cause of diseases classified elsewhere: Secondary | ICD-10-CM | POA: Diagnosis not present

## 2021-06-13 DIAGNOSIS — Z20822 Contact with and (suspected) exposure to covid-19: Secondary | ICD-10-CM | POA: Diagnosis not present

## 2021-06-13 DIAGNOSIS — R6510 Systemic inflammatory response syndrome (SIRS) of non-infectious origin without acute organ dysfunction: Secondary | ICD-10-CM | POA: Diagnosis not present

## 2021-06-13 DIAGNOSIS — E876 Hypokalemia: Secondary | ICD-10-CM | POA: Diagnosis not present

## 2021-06-13 DIAGNOSIS — N390 Urinary tract infection, site not specified: Secondary | ICD-10-CM | POA: Diagnosis not present

## 2021-06-13 DIAGNOSIS — G40419 Other generalized epilepsy and epileptic syndromes, intractable, without status epilepticus: Secondary | ICD-10-CM | POA: Diagnosis not present

## 2021-06-13 DIAGNOSIS — R6250 Unspecified lack of expected normal physiological development in childhood: Secondary | ICD-10-CM | POA: Diagnosis not present

## 2021-06-13 DIAGNOSIS — G40B09 Juvenile myoclonic epilepsy, not intractable, without status epilepticus: Secondary | ICD-10-CM | POA: Diagnosis not present

## 2021-06-13 DIAGNOSIS — D649 Anemia, unspecified: Secondary | ICD-10-CM | POA: Diagnosis not present

## 2021-06-13 DIAGNOSIS — M810 Age-related osteoporosis without current pathological fracture: Secondary | ICD-10-CM | POA: Diagnosis not present

## 2021-06-13 DIAGNOSIS — N3000 Acute cystitis without hematuria: Secondary | ICD-10-CM | POA: Diagnosis not present

## 2021-06-13 DIAGNOSIS — R509 Fever, unspecified: Secondary | ICD-10-CM | POA: Diagnosis not present

## 2021-06-13 NOTE — Telephone Encounter (Signed)
Patient's mother called stating daughter has elevated WBC that usually occur's with uti or kidney infection.  Patient placed on schedule to be seen in the morning per Dr. Ronne Binning. Mother advised if  patients develop a temperature of 101 to go to the ER. Mother voiced understanding.

## 2021-06-14 ENCOUNTER — Ambulatory Visit: Payer: Medicare Other | Admitting: Urology

## 2021-06-14 DIAGNOSIS — N200 Calculus of kidney: Secondary | ICD-10-CM

## 2021-06-20 ENCOUNTER — Encounter: Payer: Self-pay | Admitting: Urology

## 2021-06-20 ENCOUNTER — Ambulatory Visit (INDEPENDENT_AMBULATORY_CARE_PROVIDER_SITE_OTHER): Payer: Medicare Other | Admitting: Urology

## 2021-06-20 ENCOUNTER — Other Ambulatory Visit: Payer: Self-pay

## 2021-06-20 VITALS — BP 108/71 | HR 71

## 2021-06-20 DIAGNOSIS — R310 Gross hematuria: Secondary | ICD-10-CM

## 2021-06-20 DIAGNOSIS — R350 Frequency of micturition: Secondary | ICD-10-CM | POA: Diagnosis not present

## 2021-06-20 MED ORDER — CIPROFLOXACIN HCL 500 MG PO TABS: 500.0000 mg | ORAL_TABLET | Freq: Once | ORAL | Status: DC

## 2021-06-20 NOTE — Progress Notes (Signed)
? ?06/20/2021 ?12:04 PM  ? ?Molly Duffy ?04/06/1971 ?[id number] ? ?Referring provider: Caryl Bis, MD ?[address] ?[place],  [place] [address] ? ?No chief complaint on file. ? ? ?HPI: ? ?Molly Duffy has a h/o recurrent UTI and gross hematuria.  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  She is here today for exam and cystoscopy.  Renal ultrasound was benign January 2023 with possible small bilateral stones.  Images reviewed. ? ?She returns for cystoscopy and exam, but did like to hold off.  She was recently hospitalized at [facility] with a febrile illness and elevated white count.  Urine culture grew 10-50,000 mixed and blood cultures were negative. ? ?Patient is well today.  Urine clear no dysuria.  Mom really wants to hold off on exam and cystoscopy.  I told her I thought this was a good time to do it this close to where she has been thoroughly checked out and been on antibiotics.  Again they declined. ? ?Patient has been drinking more water and done better. Urine has been clear.  ? ? ?PMH: ?Past Medical History:  ?Diagnosis Date  ? Developmental delay   ? Osteoporosis   ? Seizures ([place])   ? 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 ? ?Surgical History: ?Past Surgical History:  ?Procedure Laterality Date  ? dental implants    ? vns implant  2001  ? has been turned off  ? ? ?Home Medications:  ?Allergies as of 06/20/2021   ? ?   Reactions  ? Levetiracetam Other (See Comments)  ? Makes seizures worse ?Seizures worse.  ? Vigabatrin Other (See Comments)  ? Nonconvulsive status.   ? Levetiracetam   ? Makes seizures worse  ? Phenobarbital Nausea And Vomiting  ? Makes seizures worse. ?Adverse effects  ? Phenytoin Sodium Extended   ? Tiagabine Hcl   ? Makes seizures worse  ? Tiagabine Hcl Other (See Comments)  ? Makes seizures worse  ? ?  ? ?  ?Medication List  ?  ? ?  ? Accurate as of June 20, 2021 12:04 PM. If you have any questions, ask your nurse or doctor.  ?  ?  ? ?  ? ?Banzel 200 MG  tablet ?Generic drug: rufinamide ?See admin instructions. Take 1 tablet in the morning and 2 tablets at night. ?  ?divalproex 250 MG DR tablet ?Commonly known as: DEPAKOTE ?Take 250 mg by mouth at bedtime. ?  ?divalproex 125 MG capsule ?Commonly known as: DEPAKOTE SPRINKLE ?Take 125 mg by mouth AC breakfast. Take with the 250 mg Depakote to equal 375 mg TID ?  ?HYDROcodone-acetaminophen 5-325 MG tablet ?Commonly known as: NORCO/VICODIN ?Take 1 tablet by mouth every 6 (six) hours as needed for moderate pain. ?  ?LORazepam 1 MG tablet ?Commonly known as: ATIVAN ?Take 1 mg by mouth every 8 (eight) hours. One or two for seizures as needed. ?  ?lorazepam 4 MG/ML injection ?Commonly known as: ATIVAN ?0.5 ml ( 2 mg) in one nostril prn prolonged seizure ?  ?midazolam 50 MG/10ML Soln injection ?Commonly known as: VERSED ?See admin instructions. Use 1 mL per nostril for seizures lasting longer than 5 mins ?  ?multivitamin tablet ?Take 1 tablet by mouth daily. ?  ?nitrofurantoin 50 MG capsule ?Commonly known as: MACRODANTIN ?Take 50 mg by mouth daily. ?  ?zonisamide 25 MG capsule ?Commonly known as: ZONEGRAN ?Take 75 mg by mouth daily. ?  ?Zonegran 100 MG capsule ?Generic drug: zonisamide ?See admin instructions. TAKE 1  CAPSULE AT NIGHT, WITH 3 CAPSULES OF 25 MG AT NIGHT FOR TOTAL DOSE OF 175MG  ?  ? ?  ? ? ?Allergies:  ?Allergies  ?Allergen Reactions  ? Levetiracetam Other (See Comments)  ?  Makes seizures worse ?Seizures worse.  ? Vigabatrin Other (See Comments)  ?  Nonconvulsive status.   ? Levetiracetam   ?  Makes seizures worse  ? Phenobarbital Nausea And Vomiting  ?  Makes seizures worse. ?Adverse effects  ? Phenytoin Sodium Extended   ? Tiagabine Hcl   ?  Makes seizures worse  ? Tiagabine Hcl Other (See Comments)  ?  Makes seizures worse  ? ? ?Family History: ?History reviewed. No pertinent family history. ? ?Social History:  reports that she has never smoked. She has never used smokeless tobacco. She reports that she  does not drink alcohol. No history on file for drug use. ? ? ?Physical Exam: ?BP 108/71   Pulse 71   ?Constitutional:  Alert and oriented, No acute distress. In her wheelchair.  ?HEENT: [place] AT, moist mucus membranes.  Trachea midline, no masses. ?Cardiovascular: No clubbing, cyanosis, or edema. ?Respiratory: Normal respiratory effort, no increased work of breathing. ?GI: Abdomen is soft, nontender, nondistended, no abdominal masses ?GU: No CVA tenderness ?Skin: No rashes, bruises or suspicious lesions. ?Neurologic: Grossly intact, no focal deficits, moving all 4 extremities. ?Psychiatric: Normal mood and affect. ? ? ? ?Urinalysis ?   ?Component Value Date/Time  ? APPEARANCEUR Clear 05/02/2021 1352  ? GLUCOSEU Negative 05/02/2021 1352  ? BILIRUBINUR Negative 05/02/2021 1352  ? PROTEINUR Negative 05/02/2021 1352  ? NITRITE Negative 05/02/2021 1352  ? LEUKOCYTESUR Trace (A) 05/02/2021 1352  ? ? ?Lab Results  ?Component Value Date  ? LABMICR See below: 05/02/2021  ? WBCUA 0-5 05/02/2021  ? LABEPIT 0-10 05/02/2021  ? BACTERIA Few (A) 05/02/2021  ? ? ?Pertinent Imaging: ?Renal US Jan 2023 ? ?Records: CE checked and cx's from [facility] ? ?Assessment & Plan:   ? ?1. Gross hematuria ?F/u for cystoscopy  ? ?2. Urine frequency ?Monitor UA  ? ? ?Return for for cystoscopy in about 6 weeks . ? ?Festus Aloe, MD ? ?[facility]  ?[place], [place] [address] ?[phone] ? ? ?

## 2021-06-23 DIAGNOSIS — R300 Dysuria: Secondary | ICD-10-CM | POA: Diagnosis not present

## 2021-06-23 DIAGNOSIS — N3090 Cystitis, unspecified without hematuria: Secondary | ICD-10-CM | POA: Diagnosis not present

## 2021-06-25 DIAGNOSIS — G40834 Dravet syndrome, intractable, without status epilepticus: Secondary | ICD-10-CM | POA: Diagnosis not present

## 2021-06-25 DIAGNOSIS — M80071D Age-related osteoporosis with current pathological fracture, right ankle and foot, subsequent encounter for fracture with routine healing: Secondary | ICD-10-CM | POA: Diagnosis not present

## 2021-06-25 DIAGNOSIS — G40409 Other generalized epilepsy and epileptic syndromes, not intractable, without status epilepticus: Secondary | ICD-10-CM | POA: Diagnosis not present

## 2021-06-25 DIAGNOSIS — F809 Developmental disorder of speech and language, unspecified: Secondary | ICD-10-CM | POA: Diagnosis not present

## 2021-06-27 DIAGNOSIS — N390 Urinary tract infection, site not specified: Secondary | ICD-10-CM | POA: Diagnosis not present

## 2021-06-27 DIAGNOSIS — Z9181 History of falling: Secondary | ICD-10-CM | POA: Diagnosis not present

## 2021-06-27 DIAGNOSIS — M80071D Age-related osteoporosis with current pathological fracture, right ankle and foot, subsequent encounter for fracture with routine healing: Secondary | ICD-10-CM | POA: Diagnosis not present

## 2021-06-27 DIAGNOSIS — D696 Thrombocytopenia, unspecified: Secondary | ICD-10-CM | POA: Diagnosis not present

## 2021-06-27 DIAGNOSIS — F809 Developmental disorder of speech and language, unspecified: Secondary | ICD-10-CM | POA: Diagnosis not present

## 2021-06-27 DIAGNOSIS — G40834 Dravet syndrome, intractable, without status epilepticus: Secondary | ICD-10-CM | POA: Diagnosis not present

## 2021-06-27 DIAGNOSIS — G40409 Other generalized epilepsy and epileptic syndromes, not intractable, without status epilepticus: Secondary | ICD-10-CM | POA: Diagnosis not present

## 2021-06-27 DIAGNOSIS — R6511 Systemic inflammatory response syndrome (SIRS) of non-infectious origin with acute organ dysfunction: Secondary | ICD-10-CM | POA: Diagnosis not present

## 2021-07-01 DIAGNOSIS — M80071D Age-related osteoporosis with current pathological fracture, right ankle and foot, subsequent encounter for fracture with routine healing: Secondary | ICD-10-CM | POA: Diagnosis not present

## 2021-07-01 DIAGNOSIS — G40834 Dravet syndrome, intractable, without status epilepticus: Secondary | ICD-10-CM | POA: Diagnosis not present

## 2021-07-01 DIAGNOSIS — N390 Urinary tract infection, site not specified: Secondary | ICD-10-CM | POA: Diagnosis not present

## 2021-07-01 DIAGNOSIS — G40409 Other generalized epilepsy and epileptic syndromes, not intractable, without status epilepticus: Secondary | ICD-10-CM | POA: Diagnosis not present

## 2021-07-01 DIAGNOSIS — R6511 Systemic inflammatory response syndrome (SIRS) of non-infectious origin with acute organ dysfunction: Secondary | ICD-10-CM | POA: Diagnosis not present

## 2021-07-04 ENCOUNTER — Ambulatory Visit: Payer: Medicare Other | Admitting: Urology

## 2021-07-04 DIAGNOSIS — R6511 Systemic inflammatory response syndrome (SIRS) of non-infectious origin with acute organ dysfunction: Secondary | ICD-10-CM | POA: Diagnosis not present

## 2021-07-04 DIAGNOSIS — N390 Urinary tract infection, site not specified: Secondary | ICD-10-CM | POA: Diagnosis not present

## 2021-07-04 DIAGNOSIS — M80071D Age-related osteoporosis with current pathological fracture, right ankle and foot, subsequent encounter for fracture with routine healing: Secondary | ICD-10-CM | POA: Diagnosis not present

## 2021-07-04 DIAGNOSIS — G40409 Other generalized epilepsy and epileptic syndromes, not intractable, without status epilepticus: Secondary | ICD-10-CM | POA: Diagnosis not present

## 2021-07-04 DIAGNOSIS — G40834 Dravet syndrome, intractable, without status epilepticus: Secondary | ICD-10-CM | POA: Diagnosis not present

## 2021-07-07 DIAGNOSIS — N390 Urinary tract infection, site not specified: Secondary | ICD-10-CM | POA: Diagnosis not present

## 2021-07-07 DIAGNOSIS — G40834 Dravet syndrome, intractable, without status epilepticus: Secondary | ICD-10-CM | POA: Diagnosis not present

## 2021-07-07 DIAGNOSIS — G40409 Other generalized epilepsy and epileptic syndromes, not intractable, without status epilepticus: Secondary | ICD-10-CM | POA: Diagnosis not present

## 2021-07-07 DIAGNOSIS — R6511 Systemic inflammatory response syndrome (SIRS) of non-infectious origin with acute organ dysfunction: Secondary | ICD-10-CM | POA: Diagnosis not present

## 2021-07-07 DIAGNOSIS — M80071D Age-related osteoporosis with current pathological fracture, right ankle and foot, subsequent encounter for fracture with routine healing: Secondary | ICD-10-CM | POA: Diagnosis not present

## 2021-07-08 DIAGNOSIS — M80071D Age-related osteoporosis with current pathological fracture, right ankle and foot, subsequent encounter for fracture with routine healing: Secondary | ICD-10-CM | POA: Diagnosis not present

## 2021-07-08 DIAGNOSIS — R6511 Systemic inflammatory response syndrome (SIRS) of non-infectious origin with acute organ dysfunction: Secondary | ICD-10-CM | POA: Diagnosis not present

## 2021-07-08 DIAGNOSIS — N390 Urinary tract infection, site not specified: Secondary | ICD-10-CM | POA: Diagnosis not present

## 2021-07-08 DIAGNOSIS — G40409 Other generalized epilepsy and epileptic syndromes, not intractable, without status epilepticus: Secondary | ICD-10-CM | POA: Diagnosis not present

## 2021-07-08 DIAGNOSIS — G40834 Dravet syndrome, intractable, without status epilepticus: Secondary | ICD-10-CM | POA: Diagnosis not present

## 2021-07-09 DIAGNOSIS — Z1159 Encounter for screening for other viral diseases: Secondary | ICD-10-CM | POA: Diagnosis not present

## 2021-07-11 DIAGNOSIS — R6511 Systemic inflammatory response syndrome (SIRS) of non-infectious origin with acute organ dysfunction: Secondary | ICD-10-CM | POA: Diagnosis not present

## 2021-07-11 DIAGNOSIS — M80071D Age-related osteoporosis with current pathological fracture, right ankle and foot, subsequent encounter for fracture with routine healing: Secondary | ICD-10-CM | POA: Diagnosis not present

## 2021-07-11 DIAGNOSIS — G40834 Dravet syndrome, intractable, without status epilepticus: Secondary | ICD-10-CM | POA: Diagnosis not present

## 2021-07-11 DIAGNOSIS — N390 Urinary tract infection, site not specified: Secondary | ICD-10-CM | POA: Diagnosis not present

## 2021-07-11 DIAGNOSIS — G40409 Other generalized epilepsy and epileptic syndromes, not intractable, without status epilepticus: Secondary | ICD-10-CM | POA: Diagnosis not present

## 2021-07-14 DIAGNOSIS — R6511 Systemic inflammatory response syndrome (SIRS) of non-infectious origin with acute organ dysfunction: Secondary | ICD-10-CM | POA: Diagnosis not present

## 2021-07-14 DIAGNOSIS — M80071D Age-related osteoporosis with current pathological fracture, right ankle and foot, subsequent encounter for fracture with routine healing: Secondary | ICD-10-CM | POA: Diagnosis not present

## 2021-07-14 DIAGNOSIS — G40834 Dravet syndrome, intractable, without status epilepticus: Secondary | ICD-10-CM | POA: Diagnosis not present

## 2021-07-14 DIAGNOSIS — N390 Urinary tract infection, site not specified: Secondary | ICD-10-CM | POA: Diagnosis not present

## 2021-07-14 DIAGNOSIS — G40409 Other generalized epilepsy and epileptic syndromes, not intractable, without status epilepticus: Secondary | ICD-10-CM | POA: Diagnosis not present

## 2021-07-15 DIAGNOSIS — M810 Age-related osteoporosis without current pathological fracture: Secondary | ICD-10-CM | POA: Diagnosis not present

## 2021-07-15 DIAGNOSIS — G40309 Generalized idiopathic epilepsy and epileptic syndromes, not intractable, without status epilepticus: Secondary | ICD-10-CM | POA: Diagnosis not present

## 2021-07-15 DIAGNOSIS — R600 Localized edema: Secondary | ICD-10-CM | POA: Diagnosis not present

## 2021-07-18 DIAGNOSIS — G40409 Other generalized epilepsy and epileptic syndromes, not intractable, without status epilepticus: Secondary | ICD-10-CM | POA: Diagnosis not present

## 2021-07-18 DIAGNOSIS — R6511 Systemic inflammatory response syndrome (SIRS) of non-infectious origin with acute organ dysfunction: Secondary | ICD-10-CM | POA: Diagnosis not present

## 2021-07-18 DIAGNOSIS — R300 Dysuria: Secondary | ICD-10-CM | POA: Diagnosis not present

## 2021-07-18 DIAGNOSIS — N3090 Cystitis, unspecified without hematuria: Secondary | ICD-10-CM | POA: Diagnosis not present

## 2021-07-18 DIAGNOSIS — M80071D Age-related osteoporosis with current pathological fracture, right ankle and foot, subsequent encounter for fracture with routine healing: Secondary | ICD-10-CM | POA: Diagnosis not present

## 2021-07-18 DIAGNOSIS — N390 Urinary tract infection, site not specified: Secondary | ICD-10-CM | POA: Diagnosis not present

## 2021-07-18 DIAGNOSIS — G40834 Dravet syndrome, intractable, without status epilepticus: Secondary | ICD-10-CM | POA: Diagnosis not present

## 2021-07-20 DIAGNOSIS — N390 Urinary tract infection, site not specified: Secondary | ICD-10-CM | POA: Diagnosis not present

## 2021-07-20 DIAGNOSIS — R6511 Systemic inflammatory response syndrome (SIRS) of non-infectious origin with acute organ dysfunction: Secondary | ICD-10-CM | POA: Diagnosis not present

## 2021-07-20 DIAGNOSIS — G40834 Dravet syndrome, intractable, without status epilepticus: Secondary | ICD-10-CM | POA: Diagnosis not present

## 2021-07-20 DIAGNOSIS — G40409 Other generalized epilepsy and epileptic syndromes, not intractable, without status epilepticus: Secondary | ICD-10-CM | POA: Diagnosis not present

## 2021-07-20 DIAGNOSIS — M80071D Age-related osteoporosis with current pathological fracture, right ankle and foot, subsequent encounter for fracture with routine healing: Secondary | ICD-10-CM | POA: Diagnosis not present

## 2021-07-21 DIAGNOSIS — G40834 Dravet syndrome, intractable, without status epilepticus: Secondary | ICD-10-CM | POA: Diagnosis not present

## 2021-07-28 DIAGNOSIS — M810 Age-related osteoporosis without current pathological fracture: Secondary | ICD-10-CM | POA: Diagnosis not present

## 2021-07-28 DIAGNOSIS — G40833 Dravet syndrome, intractable, with status epilepticus: Secondary | ICD-10-CM | POA: Diagnosis not present

## 2021-07-28 DIAGNOSIS — G40834 Dravet syndrome, intractable, without status epilepticus: Secondary | ICD-10-CM | POA: Diagnosis not present

## 2021-07-28 DIAGNOSIS — F79 Unspecified intellectual disabilities: Secondary | ICD-10-CM | POA: Diagnosis not present

## 2021-07-28 DIAGNOSIS — Z79899 Other long term (current) drug therapy: Secondary | ICD-10-CM | POA: Diagnosis not present

## 2021-08-01 DIAGNOSIS — R300 Dysuria: Secondary | ICD-10-CM | POA: Diagnosis not present

## 2021-08-04 DIAGNOSIS — N762 Acute vulvitis: Secondary | ICD-10-CM | POA: Diagnosis not present

## 2021-08-04 DIAGNOSIS — N390 Urinary tract infection, site not specified: Secondary | ICD-10-CM | POA: Diagnosis not present

## 2021-08-04 DIAGNOSIS — R350 Frequency of micturition: Secondary | ICD-10-CM | POA: Diagnosis not present

## 2021-08-29 ENCOUNTER — Other Ambulatory Visit: Payer: Medicare Other | Admitting: Urology

## 2021-10-05 DIAGNOSIS — T82594A Other mechanical complication of infusion catheter, initial encounter: Secondary | ICD-10-CM | POA: Diagnosis not present

## 2021-10-05 DIAGNOSIS — G40834 Dravet syndrome, intractable, without status epilepticus: Secondary | ICD-10-CM | POA: Diagnosis not present

## 2021-10-05 DIAGNOSIS — T50916D Underdosing of multiple unspecified drugs, medicaments and biological substances, subsequent encounter: Secondary | ICD-10-CM | POA: Diagnosis not present

## 2021-10-05 DIAGNOSIS — I340 Nonrheumatic mitral (valve) insufficiency: Secondary | ICD-10-CM | POA: Diagnosis not present

## 2021-10-12 DIAGNOSIS — T50905A Adverse effect of unspecified drugs, medicaments and biological substances, initial encounter: Secondary | ICD-10-CM | POA: Diagnosis not present

## 2021-10-12 DIAGNOSIS — Z6822 Body mass index (BMI) 22.0-22.9, adult: Secondary | ICD-10-CM | POA: Diagnosis not present

## 2021-10-12 DIAGNOSIS — Z0001 Encounter for general adult medical examination with abnormal findings: Secondary | ICD-10-CM | POA: Diagnosis not present

## 2021-10-12 DIAGNOSIS — G40309 Generalized idiopathic epilepsy and epileptic syndromes, not intractable, without status epilepticus: Secondary | ICD-10-CM | POA: Diagnosis not present

## 2021-10-12 DIAGNOSIS — G40812 Lennox-Gastaut syndrome, not intractable, without status epilepticus: Secondary | ICD-10-CM | POA: Diagnosis not present

## 2021-10-12 DIAGNOSIS — M80059A Age-related osteoporosis with current pathological fracture, unspecified femur, initial encounter for fracture: Secondary | ICD-10-CM | POA: Diagnosis not present

## 2021-10-12 DIAGNOSIS — M810 Age-related osteoporosis without current pathological fracture: Secondary | ICD-10-CM | POA: Diagnosis not present

## 2021-10-12 DIAGNOSIS — D6959 Other secondary thrombocytopenia: Secondary | ICD-10-CM | POA: Diagnosis not present

## 2021-11-21 DIAGNOSIS — G40833 Dravet syndrome, intractable, with status epilepticus: Secondary | ICD-10-CM | POA: Diagnosis not present

## 2021-12-09 ENCOUNTER — Encounter: Payer: Self-pay | Admitting: *Deleted

## 2021-12-09 ENCOUNTER — Telehealth: Payer: Self-pay | Admitting: *Deleted

## 2021-12-09 NOTE — Patient Outreach (Signed)
  Care Coordination   Initial Visit Note   12/09/2021 Name: MARLIE KUENNEN MRN: [id number] DOB: [birth year]  Jefm Miles is a 51 y.o. year old female who sees Richardean Chimera, MD for primary care. I  Spoke with patient's mother (caregiver), Eunice Blase, by telephone.   What matters to the patients health and wellness today?  Ongoing management of medical conditions    Goals Addressed             This Visit's Progress    Care Coordination Services (no follow-up needed)       Care Coordination Interventions: Provided patient and/or caregiver with verbal information about [facility] Care Coordination Services (community resource) Assessed social determinant of health barriers Encouraged mother (caregiver) to reach out to PCP for Care Coordination referral if services are needed in the future        SDOH assessments and interventions completed:  Yes  SDOH Interventions Today    Flowsheet Row Most Recent Value  SDOH Interventions   Financial Strain Interventions Intervention Not Indicated  Transportation Interventions Intervention Not Indicated        Care Coordination Interventions Activated:  Yes  Care Coordination Interventions:  Yes, provided   Follow up plan: No further intervention required.   Encounter Outcome:  Pt. Visit Completed   Demetrios Loll, BSN, RN-BC RN Care Coordinator Direct Dial: [phone]

## 2022-01-25 DIAGNOSIS — R300 Dysuria: Secondary | ICD-10-CM | POA: Diagnosis not present

## 2022-01-30 DIAGNOSIS — M80059A Age-related osteoporosis with current pathological fracture, unspecified femur, initial encounter for fracture: Secondary | ICD-10-CM | POA: Diagnosis not present

## 2022-01-30 DIAGNOSIS — M810 Age-related osteoporosis without current pathological fracture: Secondary | ICD-10-CM | POA: Diagnosis not present

## 2022-01-30 DIAGNOSIS — D6959 Other secondary thrombocytopenia: Secondary | ICD-10-CM | POA: Diagnosis not present

## 2022-01-30 DIAGNOSIS — Z23 Encounter for immunization: Secondary | ICD-10-CM | POA: Diagnosis not present

## 2022-01-30 DIAGNOSIS — G40309 Generalized idiopathic epilepsy and epileptic syndromes, not intractable, without status epilepticus: Secondary | ICD-10-CM | POA: Diagnosis not present

## 2022-01-30 DIAGNOSIS — Z6822 Body mass index (BMI) 22.0-22.9, adult: Secondary | ICD-10-CM | POA: Diagnosis not present

## 2022-01-30 DIAGNOSIS — G40812 Lennox-Gastaut syndrome, not intractable, without status epilepticus: Secondary | ICD-10-CM | POA: Diagnosis not present

## 2022-01-30 DIAGNOSIS — R030 Elevated blood-pressure reading, without diagnosis of hypertension: Secondary | ICD-10-CM | POA: Diagnosis not present

## 2022-01-30 DIAGNOSIS — G40834 Dravet syndrome, intractable, without status epilepticus: Secondary | ICD-10-CM | POA: Diagnosis not present

## 2022-01-30 DIAGNOSIS — T50905A Adverse effect of unspecified drugs, medicaments and biological substances, initial encounter: Secondary | ICD-10-CM | POA: Diagnosis not present

## 2022-02-03 DIAGNOSIS — R300 Dysuria: Secondary | ICD-10-CM | POA: Diagnosis not present

## 2022-03-02 DIAGNOSIS — G40834 Dravet syndrome, intractable, without status epilepticus: Secondary | ICD-10-CM | POA: Diagnosis not present

## 2022-03-02 DIAGNOSIS — G40219 Localization-related (focal) (partial) symptomatic epilepsy and epileptic syndromes with complex partial seizures, intractable, without status epilepticus: Secondary | ICD-10-CM | POA: Diagnosis not present

## 2022-03-20 DIAGNOSIS — G40834 Dravet syndrome, intractable, without status epilepticus: Secondary | ICD-10-CM | POA: Diagnosis not present

## 2022-03-20 DIAGNOSIS — Z79899 Other long term (current) drug therapy: Secondary | ICD-10-CM | POA: Diagnosis not present

## 2022-03-20 DIAGNOSIS — Z5181 Encounter for therapeutic drug level monitoring: Secondary | ICD-10-CM | POA: Diagnosis not present

## 2022-03-21 DIAGNOSIS — I082 Rheumatic disorders of both aortic and tricuspid valves: Secondary | ICD-10-CM | POA: Diagnosis not present

## 2022-05-08 DIAGNOSIS — R030 Elevated blood-pressure reading, without diagnosis of hypertension: Secondary | ICD-10-CM | POA: Diagnosis not present

## 2022-05-08 DIAGNOSIS — M545 Low back pain, unspecified: Secondary | ICD-10-CM | POA: Diagnosis not present

## 2022-05-08 DIAGNOSIS — R300 Dysuria: Secondary | ICD-10-CM | POA: Diagnosis not present

## 2022-05-08 DIAGNOSIS — Z6823 Body mass index (BMI) 23.0-23.9, adult: Secondary | ICD-10-CM | POA: Diagnosis not present

## 2022-05-27 DIAGNOSIS — M545 Low back pain, unspecified: Secondary | ICD-10-CM | POA: Diagnosis not present

## 2022-05-27 DIAGNOSIS — R030 Elevated blood-pressure reading, without diagnosis of hypertension: Secondary | ICD-10-CM | POA: Diagnosis not present

## 2022-05-27 DIAGNOSIS — Z6823 Body mass index (BMI) 23.0-23.9, adult: Secondary | ICD-10-CM | POA: Diagnosis not present

## 2022-05-27 DIAGNOSIS — N3090 Cystitis, unspecified without hematuria: Secondary | ICD-10-CM | POA: Diagnosis not present

## 2022-06-02 DIAGNOSIS — G40812 Lennox-Gastaut syndrome, not intractable, without status epilepticus: Secondary | ICD-10-CM | POA: Diagnosis not present

## 2022-06-02 DIAGNOSIS — M810 Age-related osteoporosis without current pathological fracture: Secondary | ICD-10-CM | POA: Diagnosis not present

## 2022-06-02 DIAGNOSIS — Z23 Encounter for immunization: Secondary | ICD-10-CM | POA: Diagnosis not present

## 2022-06-02 DIAGNOSIS — M80059A Age-related osteoporosis with current pathological fracture, unspecified femur, initial encounter for fracture: Secondary | ICD-10-CM | POA: Diagnosis not present

## 2022-06-02 DIAGNOSIS — G40309 Generalized idiopathic epilepsy and epileptic syndromes, not intractable, without status epilepticus: Secondary | ICD-10-CM | POA: Diagnosis not present

## 2022-06-02 DIAGNOSIS — D6959 Other secondary thrombocytopenia: Secondary | ICD-10-CM | POA: Diagnosis not present

## 2022-06-02 DIAGNOSIS — Z6822 Body mass index (BMI) 22.0-22.9, adult: Secondary | ICD-10-CM | POA: Diagnosis not present

## 2022-06-02 DIAGNOSIS — T50905A Adverse effect of unspecified drugs, medicaments and biological substances, initial encounter: Secondary | ICD-10-CM | POA: Diagnosis not present

## 2022-06-06 DIAGNOSIS — Z6822 Body mass index (BMI) 22.0-22.9, adult: Secondary | ICD-10-CM | POA: Diagnosis not present

## 2022-06-06 DIAGNOSIS — Z01419 Encounter for gynecological examination (general) (routine) without abnormal findings: Secondary | ICD-10-CM | POA: Diagnosis not present

## 2022-07-12 DIAGNOSIS — R300 Dysuria: Secondary | ICD-10-CM | POA: Diagnosis not present

## 2022-07-12 DIAGNOSIS — N390 Urinary tract infection, site not specified: Secondary | ICD-10-CM | POA: Diagnosis not present

## 2022-07-12 DIAGNOSIS — R0602 Shortness of breath: Secondary | ICD-10-CM | POA: Diagnosis not present

## 2022-07-12 DIAGNOSIS — B952 Enterococcus as the cause of diseases classified elsewhere: Secondary | ICD-10-CM | POA: Diagnosis not present

## 2022-07-24 DIAGNOSIS — R300 Dysuria: Secondary | ICD-10-CM | POA: Diagnosis not present

## 2022-07-24 DIAGNOSIS — R030 Elevated blood-pressure reading, without diagnosis of hypertension: Secondary | ICD-10-CM | POA: Diagnosis not present

## 2022-07-24 DIAGNOSIS — Z6822 Body mass index (BMI) 22.0-22.9, adult: Secondary | ICD-10-CM | POA: Diagnosis not present

## 2022-07-25 DIAGNOSIS — G40833 Dravet syndrome, intractable, with status epilepticus: Secondary | ICD-10-CM | POA: Diagnosis not present

## 2022-07-25 DIAGNOSIS — N390 Urinary tract infection, site not specified: Secondary | ICD-10-CM | POA: Diagnosis not present

## 2022-07-25 DIAGNOSIS — R300 Dysuria: Secondary | ICD-10-CM | POA: Diagnosis not present

## 2022-09-13 DIAGNOSIS — G40834 Dravet syndrome, intractable, without status epilepticus: Secondary | ICD-10-CM | POA: Diagnosis not present

## 2022-09-13 DIAGNOSIS — I361 Nonrheumatic tricuspid (valve) insufficiency: Secondary | ICD-10-CM | POA: Diagnosis not present

## 2022-09-13 DIAGNOSIS — Z79899 Other long term (current) drug therapy: Secondary | ICD-10-CM | POA: Diagnosis not present

## 2022-09-14 DIAGNOSIS — G40219 Localization-related (focal) (partial) symptomatic epilepsy and epileptic syndromes with complex partial seizures, intractable, without status epilepticus: Secondary | ICD-10-CM | POA: Diagnosis not present

## 2022-09-29 DIAGNOSIS — Z13220 Encounter for screening for lipoid disorders: Secondary | ICD-10-CM | POA: Diagnosis not present

## 2022-09-29 DIAGNOSIS — Z79899 Other long term (current) drug therapy: Secondary | ICD-10-CM | POA: Diagnosis not present

## 2022-09-29 DIAGNOSIS — G40834 Dravet syndrome, intractable, without status epilepticus: Secondary | ICD-10-CM | POA: Diagnosis not present

## 2022-09-29 DIAGNOSIS — G40833 Dravet syndrome, intractable, with status epilepticus: Secondary | ICD-10-CM | POA: Diagnosis not present

## 2022-11-03 DIAGNOSIS — Z1389 Encounter for screening for other disorder: Secondary | ICD-10-CM | POA: Diagnosis not present

## 2022-11-03 DIAGNOSIS — G40309 Generalized idiopathic epilepsy and epileptic syndromes, not intractable, without status epilepticus: Secondary | ICD-10-CM | POA: Diagnosis not present

## 2022-11-03 DIAGNOSIS — Z23 Encounter for immunization: Secondary | ICD-10-CM | POA: Diagnosis not present

## 2022-11-03 DIAGNOSIS — Z1331 Encounter for screening for depression: Secondary | ICD-10-CM | POA: Diagnosis not present

## 2022-11-03 DIAGNOSIS — R030 Elevated blood-pressure reading, without diagnosis of hypertension: Secondary | ICD-10-CM | POA: Diagnosis not present

## 2022-11-03 DIAGNOSIS — M810 Age-related osteoporosis without current pathological fracture: Secondary | ICD-10-CM | POA: Diagnosis not present

## 2022-11-03 DIAGNOSIS — D6959 Other secondary thrombocytopenia: Secondary | ICD-10-CM | POA: Diagnosis not present

## 2022-11-03 DIAGNOSIS — Z0001 Encounter for general adult medical examination with abnormal findings: Secondary | ICD-10-CM | POA: Diagnosis not present

## 2022-11-03 DIAGNOSIS — T50905A Adverse effect of unspecified drugs, medicaments and biological substances, initial encounter: Secondary | ICD-10-CM | POA: Diagnosis not present

## 2022-11-03 DIAGNOSIS — G40812 Lennox-Gastaut syndrome, not intractable, without status epilepticus: Secondary | ICD-10-CM | POA: Diagnosis not present

## 2022-11-03 DIAGNOSIS — M80059A Age-related osteoporosis with current pathological fracture, unspecified femur, initial encounter for fracture: Secondary | ICD-10-CM | POA: Diagnosis not present

## 2022-11-03 DIAGNOSIS — Z6823 Body mass index (BMI) 23.0-23.9, adult: Secondary | ICD-10-CM | POA: Diagnosis not present

## 2022-11-23 DIAGNOSIS — Z1211 Encounter for screening for malignant neoplasm of colon: Secondary | ICD-10-CM | POA: Diagnosis not present

## 2022-11-23 DIAGNOSIS — Z1212 Encounter for screening for malignant neoplasm of rectum: Secondary | ICD-10-CM | POA: Diagnosis not present

## 2022-12-12 DIAGNOSIS — N958 Other specified menopausal and perimenopausal disorders: Secondary | ICD-10-CM | POA: Diagnosis not present

## 2022-12-12 DIAGNOSIS — R350 Frequency of micturition: Secondary | ICD-10-CM | POA: Diagnosis not present

## 2022-12-12 DIAGNOSIS — N390 Urinary tract infection, site not specified: Secondary | ICD-10-CM | POA: Diagnosis not present

## 2022-12-12 DIAGNOSIS — R151 Fecal smearing: Secondary | ICD-10-CM | POA: Diagnosis not present

## 2022-12-15 DIAGNOSIS — N390 Urinary tract infection, site not specified: Secondary | ICD-10-CM | POA: Diagnosis not present

## 2023-01-04 DIAGNOSIS — N390 Urinary tract infection, site not specified: Secondary | ICD-10-CM | POA: Diagnosis not present

## 2023-01-23 DIAGNOSIS — R399 Unspecified symptoms and signs involving the genitourinary system: Secondary | ICD-10-CM | POA: Diagnosis not present

## 2023-01-30 DIAGNOSIS — Z23 Encounter for immunization: Secondary | ICD-10-CM | POA: Diagnosis not present

## 2023-02-12 DIAGNOSIS — R399 Unspecified symptoms and signs involving the genitourinary system: Secondary | ICD-10-CM | POA: Diagnosis not present

## 2023-02-23 DIAGNOSIS — N200 Calculus of kidney: Secondary | ICD-10-CM | POA: Diagnosis not present

## 2023-02-27 DIAGNOSIS — N390 Urinary tract infection, site not specified: Secondary | ICD-10-CM | POA: Diagnosis not present

## 2023-02-27 DIAGNOSIS — N958 Other specified menopausal and perimenopausal disorders: Secondary | ICD-10-CM | POA: Diagnosis not present

## 2023-02-27 DIAGNOSIS — N200 Calculus of kidney: Secondary | ICD-10-CM | POA: Diagnosis not present

## 2023-02-27 DIAGNOSIS — R350 Frequency of micturition: Secondary | ICD-10-CM | POA: Diagnosis not present

## 2023-03-20 ENCOUNTER — Encounter: Payer: Self-pay | Admitting: Physician Assistant

## 2023-03-23 DIAGNOSIS — G40309 Generalized idiopathic epilepsy and epileptic syndromes, not intractable, without status epilepticus: Secondary | ICD-10-CM | POA: Diagnosis not present

## 2023-03-23 DIAGNOSIS — T50905A Adverse effect of unspecified drugs, medicaments and biological substances, initial encounter: Secondary | ICD-10-CM | POA: Diagnosis not present

## 2023-03-23 DIAGNOSIS — M80059A Age-related osteoporosis with current pathological fracture, unspecified femur, initial encounter for fracture: Secondary | ICD-10-CM | POA: Diagnosis not present

## 2023-03-23 DIAGNOSIS — Z6824 Body mass index (BMI) 24.0-24.9, adult: Secondary | ICD-10-CM | POA: Diagnosis not present

## 2023-03-23 DIAGNOSIS — D6959 Other secondary thrombocytopenia: Secondary | ICD-10-CM | POA: Diagnosis not present

## 2023-03-23 DIAGNOSIS — G40812 Lennox-Gastaut syndrome, not intractable, without status epilepticus: Secondary | ICD-10-CM | POA: Diagnosis not present

## 2023-03-23 DIAGNOSIS — M810 Age-related osteoporosis without current pathological fracture: Secondary | ICD-10-CM | POA: Diagnosis not present

## 2023-03-23 DIAGNOSIS — Z23 Encounter for immunization: Secondary | ICD-10-CM | POA: Diagnosis not present

## 2023-03-23 DIAGNOSIS — R030 Elevated blood-pressure reading, without diagnosis of hypertension: Secondary | ICD-10-CM | POA: Diagnosis not present

## 2023-03-28 DIAGNOSIS — G40834 Dravet syndrome, intractable, without status epilepticus: Secondary | ICD-10-CM | POA: Diagnosis not present

## 2023-03-28 DIAGNOSIS — Z151 Genetic susceptibility to epilepsy and neurodevelopmental disorders: Secondary | ICD-10-CM | POA: Diagnosis not present

## 2023-03-28 DIAGNOSIS — G40219 Localization-related (focal) (partial) symptomatic epilepsy and epileptic syndromes with complex partial seizures, intractable, without status epilepticus: Secondary | ICD-10-CM | POA: Diagnosis not present

## 2023-05-01 DIAGNOSIS — B961 Klebsiella pneumoniae [K. pneumoniae] as the cause of diseases classified elsewhere: Secondary | ICD-10-CM | POA: Diagnosis not present

## 2023-05-01 DIAGNOSIS — N390 Urinary tract infection, site not specified: Secondary | ICD-10-CM | POA: Diagnosis not present

## 2023-05-01 DIAGNOSIS — R399 Unspecified symptoms and signs involving the genitourinary system: Secondary | ICD-10-CM | POA: Diagnosis not present

## 2023-06-01 ENCOUNTER — Ambulatory Visit: Payer: Medicare Other | Admitting: Physician Assistant

## 2023-06-01 ENCOUNTER — Encounter: Payer: Self-pay | Admitting: Physician Assistant

## 2023-06-01 VITALS — BP 116/70 | HR 71 | Ht 66.0 in | Wt 154.0 lb

## 2023-06-01 DIAGNOSIS — R195 Other fecal abnormalities: Secondary | ICD-10-CM

## 2023-06-01 DIAGNOSIS — G40909 Epilepsy, unspecified, not intractable, without status epilepticus: Secondary | ICD-10-CM | POA: Diagnosis not present

## 2023-06-01 DIAGNOSIS — G40812 Lennox-Gastaut syndrome, not intractable, without status epilepticus: Secondary | ICD-10-CM | POA: Diagnosis not present

## 2023-06-01 NOTE — Progress Notes (Signed)
Chief Complaint: Discuss colonoscopy for + cologuard  HPI:    Molly Duffy is a 53 year old female with a past medical history as listed below including developmental delay, epilepsy and history of seizures, who was referred to me by Richardean Chimera, MD for discussion of a colonoscopy for + cologuard.    03/28/2023 followed with neurology at [facility] in regards to her epilepsy.  Apparently at that time seizures under "good control".  Had had a seizure in September and March 2024.    Today, patient presents to clinic accompanied by her parents who are her sole caretakers.  She does have a developmental disorder and is unable to provide her own history.  Per her mother she had a Cologuard test back in August which turned up positive (we do not have these results).  She has since been told that her daughter needs a colonoscopy.  There is trouble with this given that she has uncontrolled epilepsy and also the fact that her family does not think that she could bowel prep at home and may or may not be able to drink the whole bowel prep completely.  She is asking about options and what to do from here.    Has an established neurologist over at [facility].    Denies fever, chills, weight loss, abdominal pain, blood in her stool or change in bowel habits.     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    Past Surgical History:  Procedure Laterality Date   dental implants     vns implant  2001   has been turned off    Current Outpatient Medications  Medication Sig Dispense Refill   BANZEL 200 MG tablet See admin instructions. Take 1 tablet in the morning and 2 tablets at night. (Patient not taking: Reported on 06/20/2021)     divalproex (DEPAKOTE SPRINKLE) 125 MG capsule Take 125 mg by mouth AC breakfast. Take with the 250 mg Depakote to equal 375 mg TID     divalproex (DEPAKOTE) 250 MG  DR tablet Take 250 mg by mouth at bedtime. (Patient not taking: Reported on 06/20/2021)     HYDROcodone-acetaminophen (NORCO/VICODIN) 5-325 MG tablet Take 1 tablet by mouth every 6 (six) hours as needed for moderate pain. 30 tablet 0   LORazepam (ATIVAN) 1 MG tablet Take 1 mg by mouth every 8 (eight) hours. One or two for seizures as needed.     lorazepam (ATIVAN) 4 MG/ML injection 0.5 ml ( 2 mg) in one nostril prn prolonged seizure 5 mL 0   midazolam (VERSED) 50 MG/10ML SOLN injection See admin instructions. Use 1 mL per nostril for seizures lasting longer than 5 mins     Multiple Vitamin (MULTIVITAMIN) tablet Take 1 tablet by mouth daily.     nitrofurantoin (MACRODANTIN) 50 MG capsule Take 50 mg by mouth daily. (Patient not taking: Reported on 06/20/2021)     ZONEGRAN 100 MG capsule See admin instructions. TAKE 1 CAPSULE AT NIGHT, WITH 3 CAPSULES OF 25 MG AT NIGHT FOR TOTAL DOSE OF 175MG      zonisamide (ZONEGRAN) 25 MG capsule Take 75 mg by mouth daily.     No current facility-administered medications for this visit.    Allergies as of 06/01/2023 - Review Complete 12/09/2021  Allergen Reaction Noted   Levetiracetam Other (See Comments) 06/11/2019   Vigabatrin Other (See Comments)  06/11/2019   Levetiracetam  08/04/2011   Phenobarbital Nausea And Vomiting 01/16/2011   Phenytoin sodium extended  06/11/2019   Tiagabine hcl  08/04/2011   Tiagabine hcl Other (See Comments) 04/28/2014    No family history on file.  Social History   Socioeconomic History   Marital status: Single    Spouse name: Not on file   Number of children: Not on file   Years of education: Not on file   Highest education level: Not on file  Occupational History   Not on file  Tobacco Use   Smoking status: Never   Smokeless tobacco: Never  Substance and Sexual Activity   Alcohol use: No   Drug use: Not on file   Sexual activity: Not on file  Other Topics Concern   Not on file  Social History Narrative   Not on  file   Social Drivers of Health   Financial Resource Strain: Low Risk  (12/09/2021)   Overall Financial Resource Strain (CARDIA)    Difficulty of Paying Living Expenses: Not hard at all  Food Insecurity: Low Risk  (02/27/2023)   Received from [facility]   Hunger Vital Sign    Worried About Running Out of Food in the Last Year: Never true    Ran Out of Food in the Last Year: Never true  Transportation Needs: No Transportation Needs (02/27/2023)   Received from [facility]    In the past 12 months, has lack of reliable transportation kept you from medical appointments, meetings, work or from getting things needed for daily living? : No  Physical Activity: Not on file  Stress: Not on file  Social Connections: Not on file  Intimate Partner Violence: Not on file    Review of Systems (per family):    Constitutional: No weight loss, fever or chills Skin: No rash  Cardiovascular: No chest pain  Respiratory: No  Gastrointestinal: See HPI and otherwise negative Genitourinary: No dysuria  Neurological: No headache Musculoskeletal: No new muscle or joint pain Hematologic: No bleeding  Psychiatric: No history of depression or anxiety   Physical Exam:  Vital signs: BP 116/70   Pulse 71   Ht 5\' 6"  (1.676 m)   Wt 154 lb (69.9 kg)   SpO2 100%   BMI 24.86 kg/m    Constitutional:   Pleasant Caucasian female appears to be in NAD, Well developed, Well nourished, alert and cooperative +developmental disorder in wheelchair Head:  Normocephalic and atraumatic. Eyes:   PEERL, EOMI. No icterus. Conjunctiva pink. Ears:  Normal auditory acuity. Neck:  Supple Throat: Oral cavity and pharynx without inflammation, swelling or lesion.  Respiratory: Respirations even and unlabored. Lungs clear to auscultation bilaterally.   No wheezes, crackles, or rhonchi.  Cardiovascular: Normal S1, S2. No MRG. Regular rate and rhythm. No peripheral edema, cyanosis or pallor.   Gastrointestinal:  Soft, nondistended, nontender. No rebound or guarding. Normal bowel sounds. No appreciable masses or hepatomegaly. Rectal:  Not performed.  Msk:  Symmetrical without gross deformities. Without edema, no deformity or joint abnormality. +in wheelchair Neurologic:  Alert;  grossly normal neurologically.  Skin:   Dry and intact without significant lesions or rashes. Psychiatric: Answers some questions  No recent labs or imaging.  Assessment: 1.  Positive Cologuard: Back in August, needs a colonoscopy 2.  Uncontrolled epilepsy 3.  Developmental disorder- Lennox-Gastaut syndrome  Plan: 1.  Given patient's complexities and her uncontrolled epilepsy as well as developmental disorder and difficulty with bowel prep  patient needs to pursue a colonoscopy at the hospital.  This will likely need to be done after an admission with bowel prep at the hospital.  Had a long discussion with the patient's parents.  They are worried that going in and out of anesthesia will cause a seizure and want to talk it over with their neurologist at [facility] prior to scheduling.  Explained that they could have the colonoscopy done somewhere closer to their home neurologist if that is worrying to them.  The parents though are seen here and would prefer she be done by one of our physicians.  Her mom will call and let me know the decision after they have further discussed with their neurologist. 2.  Patient to follow in clinic with [place] after above.  Established with Dr. Meridee Score today as he has more time at the hospital and this would need to be a hospital case. 3.  Will try to get patient set up for MyChart for easier communication  Hyacinth Meeker, PA-C [facility] 06/01/2023, 2:24 PM  Cc: Richardean Chimera, MD

## 2023-06-01 NOTE — Patient Instructions (Signed)
_______________________________________________________  If your blood pressure at your visit was 140/90 or greater, please contact your primary care physician to follow up on this.  If you are age 52 or younger, your body mass index should be between 19-25. Your Body mass index is 24.86 kg/m. If this is out of the aformentioned range listed, please consider follow up with your Primary Care Provider.  ________________________________________________________  The [facility] providers would like to encourage you to use [facility] to communicate with providers for non-urgent requests or questions.  Due to long hold times on the telephone, sending your provider a message by [facility] may be a faster and more efficient way to get a response.  Please allow 48 business hours for a response.  Please remember that this is for non-urgent requests.  _______________________________________________________  Molly Duffy will follow up in our office on an as needed basis.  Please contact MyChart at [phone] with further questions regarding MyChart issues.  Thank you for entrusting me with your care and choosing [facility].  Hyacinth Meeker, PA-C

## 2023-06-05 ENCOUNTER — Telehealth: Payer: Self-pay

## 2023-06-05 NOTE — Progress Notes (Signed)
Attending Physician's Attestation   I have reviewed the chart.   I agree with the Advanced Practitioner's note, impression, and recommendations with any updates as below. Difficult situation.  Set up a followup in clinic with you in 47-months so that if we haven't heard from them, that there is closure of loop as to planning of procedure or not.   Corliss Parish, MD [facility] Advanced Endoscopy Office # [phone]

## 2023-06-05 NOTE — Telephone Encounter (Signed)
-----   Message from Unk Lightning sent at 06/05/2023  9:07 AM EST ----- Regarding: Can you set up follow-up appointment in 3 months Can you set a follow-up appointment with me in 3 months so that we can make sure she is arranged for procedures if needed. ----- Message ----- From: Lemar Lofty., MD Sent: 06/05/2023   6:40 AM EST To: Unk Lightning, PA     ----- Message ----- From: Unk Lightning, [place] Sent: 06/01/2023   3:06 PM EST To: Lemar Lofty., MD

## 2023-06-05 NOTE — Telephone Encounter (Signed)
Patient has been scheduled for a 56-month follow up appt with Hyacinth Meeker, PA-C on Wednesday, 09/12/23 at 2:30 pm. Letter mailed to patient with appt information.

## 2023-06-12 DIAGNOSIS — R350 Frequency of micturition: Secondary | ICD-10-CM | POA: Diagnosis not present

## 2023-06-12 DIAGNOSIS — N200 Calculus of kidney: Secondary | ICD-10-CM | POA: Diagnosis not present

## 2023-06-12 DIAGNOSIS — R159 Full incontinence of feces: Secondary | ICD-10-CM | POA: Diagnosis not present

## 2023-06-12 DIAGNOSIS — N390 Urinary tract infection, site not specified: Secondary | ICD-10-CM | POA: Diagnosis not present

## 2023-06-15 DIAGNOSIS — N200 Calculus of kidney: Secondary | ICD-10-CM | POA: Diagnosis not present

## 2023-07-09 NOTE — Telephone Encounter (Signed)
 Team, I agree that this seems to be getting more complex by the day. Last Neurology note however suggested a 47-month followup since she was doing well overall. There is no documentation of the patient's Neurologist stating that we needed to do this, but if his office faxes [place] this, then we can do our best to know how to contact Neurology for a consultation if needed, but they cannot be available for an outpatient procedure persea. Doing this case at the hospital will be the safest for the patient and family. With this being said, if the family has concerns, then the next step is for the patient to just have her procedure at [facility] where the majority of the rest of her care as well as Neurologist is. Let me know what she decides. I am happy to work with the patient/family as best I can, if they desire. Thanks. GM

## 2023-07-10 ENCOUNTER — Telehealth: Payer: Self-pay | Admitting: Physician Assistant

## 2023-07-10 NOTE — Telephone Encounter (Signed)
 Inbound call from patient's mother returning phone call regarding scheduling colonoscopy. Requesting a call back to her to discuss scheduling. Please advise, thank you.

## 2023-07-10 NOTE — Telephone Encounter (Signed)
 See patient message dated 07/06/23.  Patient needs a colonoscopy. She has a severe seizure disorder (Dravet's Syndrome) and her neurologist is at [facility]. I have called [name], nurse to the neurologist Dr Inez Catalina. She is very approachable and can be reached at [phone]. Confirmed Dr Inez Catalina is aware of the plan and offers help that he is available for questions and planning. [name] will help connect the [facility] providers with Dr Inez Catalina for any questions, and to assist with any pre-planning to be done.  I spoke with Molly Duffy, mother of the patient. She appreciates that this could be moved to [facility] for the GI care. She declines and states her preference for [facility].

## 2023-07-17 MED ORDER — NA SULFATE-K SULFATE-MG SULF 17.5-3.13-1.6 GM/177ML PO SOLN: 1.0000 | Freq: Once | ORAL | 0 refills | Status: AC

## 2023-07-17 NOTE — Telephone Encounter (Signed)
 Colon has been set up for 08/09/23 at 10 am at [facility] with GM  prep has been sent to the pharmacy

## 2023-07-17 NOTE — Telephone Encounter (Signed)
 Left message on machine to call back

## 2023-07-18 ENCOUNTER — Telehealth: Payer: Self-pay | Admitting: Physician Assistant

## 2023-07-18 NOTE — Telephone Encounter (Signed)
 View All Conversations on this Encounter Mansouraty, Netty Starring., MD  McKew, Austin Miles, LPN; Unk Lightning, [place]; Mansouraty, Netty Starring., MD37 minutes ago (4:05 PM)    I'm not sure that it will be covered by Insurance or not. I'm not sure if this was discussed previously with JLL or not. If it will get covered, which is not something that I can know persea, then OK. If she can come into the hospital, it will then need to be arranged with Hospital medicine to accept patient. If they are willing to accept, then can admit to their service and we can leave recommendations for preparation if necessary. This patient will not be admitted to any GI provider for procedure however.  JLL, was this ever discussed with the patient's family about being admitted? GM      Note   McKew, Austin Miles, LPN routed conversation to Mansouraty, Netty Starring., MD42 minutes ago (4:01 PM)   Evalee Jefferson, [place] minutes ago (4:01 PM)    Okay to admit overnight for her prep?      Note   Jesus Genera routed conversation to [employer] C Triage1 hour ago (3:35 PM)   Marjo Bicker, [place]1 hour ago (3:35 PM)   VD Inbound call from patient's mother requesting a call to discuss 4/24 colonoscopy at [facility]. States when patient and her were in the office it was discussed that the patient would stay overnight at the hospital due to concerns she has with the prep for patient. Patient's mom is requesting a call to discuss further. Please advise, thank you.

## 2023-07-18 NOTE — Telephone Encounter (Signed)
 Left message on machine to call back

## 2023-07-18 NOTE — Telephone Encounter (Signed)
 Inbound call from patient's mother requesting a call to discuss 4/24 colonoscopy at [facility]. States when patient and her were in the office it was discussed that the patient would stay overnight at the hospital due to concerns she has with the prep for patient. Patient's mom is requesting a call to discuss further. Please advise, thank you.

## 2023-07-18 NOTE — Telephone Encounter (Signed)
 See alternate phone note dated 4/2

## 2023-07-18 NOTE — Telephone Encounter (Signed)
 Okay to admit overnight for her prep?

## 2023-07-18 NOTE — Telephone Encounter (Signed)
 I'm not sure that it will be covered by [employer] or not. I'm not sure if this was discussed previously with JLL or not. If it will get covered, which is not something that I can know persea, then OK. If she can come into the hospital, it will then need to be arranged with Hospital medicine to accept patient. If they are willing to accept, then can admit to their service and we can leave recommendations for preparation if necessary. This patient will not be admitted to any GI provider for procedure however.  JLL, was this ever discussed with the patient's family about being admitted? GM

## 2023-07-19 NOTE — Telephone Encounter (Signed)
 Sounds OK, if the insurance works out. Will just need to make sure that [facility] is [facility] to admit. Thanks. GM

## 2023-07-19 NOTE — Telephone Encounter (Signed)
 Unk Lightning, [place]  Mansouraty, Netty Starring., MD; Loretha Stapler, RN Caller: Unspecified (Yesterday,  3:33 PM) Yes, as you can read in my plan we did discuss that the patient will need to be admitted for a bowel prep if she chose to have this procedure done at our hospital.  Also not sure about cost, they would need to call her insurance.  Thanks, JL L

## 2023-07-19 NOTE — Telephone Encounter (Signed)
 I spoke with the pt mother and discussed Dr Mansouraty's concerns.  She states that she will discuss tomorrow with her PCP. (She has an appt) She will call back if she has any other information for [place].

## 2023-07-20 DIAGNOSIS — G40834 Dravet syndrome, intractable, without status epilepticus: Secondary | ICD-10-CM | POA: Diagnosis not present

## 2023-07-20 DIAGNOSIS — Z6824 Body mass index (BMI) 24.0-24.9, adult: Secondary | ICD-10-CM | POA: Diagnosis not present

## 2023-07-20 DIAGNOSIS — Z151 Genetic susceptibility to epilepsy and neurodevelopmental disorders: Secondary | ICD-10-CM | POA: Diagnosis not present

## 2023-07-20 DIAGNOSIS — G40219 Localization-related (focal) (partial) symptomatic epilepsy and epileptic syndromes with complex partial seizures, intractable, without status epilepticus: Secondary | ICD-10-CM | POA: Diagnosis not present

## 2023-07-20 DIAGNOSIS — M818 Other osteoporosis without current pathological fracture: Secondary | ICD-10-CM | POA: Diagnosis not present

## 2023-08-01 ENCOUNTER — Telehealth: Payer: Self-pay | Admitting: Gastroenterology

## 2023-08-01 NOTE — Telephone Encounter (Signed)
 Procedure:Colonoscopy Procedure date: 08/09/23 Procedure location: [facility] Arrival Time: 8:30 am Spoke with the patient Y/N:   No, I left a detailed message for the patient to return call  08/01/23 @ 10:07 am  Any prep concerns? ___  Has the patient obtained the prep from the pharmacy ? ___ Do you have a care partner and transportation: ___ Any additional concerns? ___

## 2023-08-02 NOTE — Telephone Encounter (Signed)
 I have placed a call to [facility] to see if there is anyway that this would be possible. Most likely it will be no. She is too complex for a direct admission under GI. Most likely as well, it will not be covered by insurance and would be completely out of pocket. I will update when I discuss things further.

## 2023-08-02 NOTE — Telephone Encounter (Signed)
 Dr Brice Campi the pt is wanting to cancel the case for colon if her PCP can not admit the pt for prep.  FYI

## 2023-08-02 NOTE — Telephone Encounter (Signed)
 Patty, I called and spoke with [facility] medicine. They cannot preadmit any patient until the day that they need a "bed". With this being said, if she were to have a procedure on Thursday she would need to have a bed on Wednesday, and bed availability is variable such that there may not be any bed availability that can occur for hours and potentially even into the evening when it may not make sense for colonoscopic preparation to begin. The other issue at play, which medicine could not tell me at this time, is that this is unlikely to be covered by insurance, and as a result full hospital stay, if a bed were to even be available and a medicine attending were to accept the patient that day, could end up being completely out-of-pocket. So the options that we have are the following: 1) patient and family try to complete preparation as best they can at home and come for scheduled procedure such that if they come in early and she is not completely clean we could give her enemas in an effort of trying to complete her procedure 2) we can wait until Wednesday to call [facility] medicine and see if they would potentially accept the patient, but then the understanding from the patient and family is that this may not be covered by insurance and that there may not even be a bed available and/or the bed may become available at a time that does not make sense to begin a preparation 3) cancel procedure Unfortunately, we no longer have a service for admission of GI [place] patients Please let patient's family know and let us  know what they would like to pursue. Thanks. GM

## 2023-08-03 ENCOUNTER — Encounter (HOSPITAL_COMMUNITY): Payer: Self-pay | Admitting: Gastroenterology

## 2023-08-06 NOTE — Telephone Encounter (Signed)
 Spoke with the pt mother and made her aware of the recommendations to proceed with Colon.  She has decided to cancel for now and call back after they have had a chance to discuss and come up with a possible solution.

## 2023-08-07 NOTE — Telephone Encounter (Signed)
 Patty, Thank you for reaching out. We understand the frustration of the patient's family with this, but can only do what we can. She has an appointment follow-up in May, so hopefully we can finalize a plan of action at that point.  Unfortunately, the logistics of admitting patients to the hospital just does not work these days. And although not ideal, would a CT colonography be a potential route to move forward with, even in the setting of positive Cologuard, probably not the best, but I also cannot justify a medicine admission at this point, that is predetermined and scheduled and know for sure that she will have a bed. Please forward to the patient's referring provider this chain of discussions and so that they are aware patient's mother canceled the upcoming outpatient colonoscopy for positive Cologuard. GM

## 2023-08-09 ENCOUNTER — Ambulatory Visit (HOSPITAL_COMMUNITY): Admission: RE | Admit: 2023-08-09 | Source: Home / Self Care | Admitting: Gastroenterology

## 2023-08-09 ENCOUNTER — Encounter (HOSPITAL_COMMUNITY): Admission: RE | Payer: Self-pay | Source: Home / Self Care

## 2023-08-09 SURGERY — COLONOSCOPY
Anesthesia: Monitor Anesthesia Care

## 2023-08-13 ENCOUNTER — Telehealth: Payer: Self-pay | Admitting: Physician Assistant

## 2023-08-13 NOTE — Telephone Encounter (Signed)
 Mother of the patient called and stated that she would like to see if she can get a colonoscopy schedule for her daughter and she will figure out how she can give her daughter that prep medication in order to get the colonoscopy done. Patient mother is requesting a call back.. Please advise.

## 2023-08-14 NOTE — Telephone Encounter (Signed)
 Left message on machine to call back   Offer 10/22/23 at 9 am

## 2023-08-15 MED ORDER — NA SULFATE-K SULFATE-MG SULF 17.5-3.13-1.6 GM/177ML PO SOLN: 1.0000 | Freq: Once | ORAL | 0 refills | Status: AC

## 2023-08-15 NOTE — Telephone Encounter (Signed)
 Left message on machine to call back (time is 10 am on 10/22/23)

## 2023-08-15 NOTE — Telephone Encounter (Signed)
 Patient called and stated that she would like to know if her daughter procedure will be scheduled for the hospital. Patient is requesting a call back. Please advise.

## 2023-08-15 NOTE — Telephone Encounter (Signed)
 Spoke with the pts mother and have the pt on for 10/22/23 at 10 am at [facility] with GM   Prep sent to the pharmacy and instructions sent to My Chart

## 2023-09-05 DIAGNOSIS — Z13820 Encounter for screening for osteoporosis: Secondary | ICD-10-CM | POA: Diagnosis not present

## 2023-09-05 DIAGNOSIS — M810 Age-related osteoporosis without current pathological fracture: Secondary | ICD-10-CM | POA: Diagnosis not present

## 2023-09-05 DIAGNOSIS — Z780 Asymptomatic menopausal state: Secondary | ICD-10-CM | POA: Diagnosis not present

## 2023-09-11 DIAGNOSIS — N200 Calculus of kidney: Secondary | ICD-10-CM | POA: Diagnosis not present

## 2023-09-11 DIAGNOSIS — R350 Frequency of micturition: Secondary | ICD-10-CM | POA: Diagnosis not present

## 2023-09-11 DIAGNOSIS — N958 Other specified menopausal and perimenopausal disorders: Secondary | ICD-10-CM | POA: Diagnosis not present

## 2023-09-11 DIAGNOSIS — R159 Full incontinence of feces: Secondary | ICD-10-CM | POA: Diagnosis not present

## 2023-09-11 DIAGNOSIS — N390 Urinary tract infection, site not specified: Secondary | ICD-10-CM | POA: Diagnosis not present

## 2023-09-12 ENCOUNTER — Ambulatory Visit: Payer: Medicare Other | Admitting: Physician Assistant

## 2023-10-15 ENCOUNTER — Encounter (HOSPITAL_COMMUNITY): Payer: Self-pay | Admitting: Gastroenterology

## 2023-10-15 NOTE — Progress Notes (Signed)
 Attempted to obtain medical history for pre op call via telephone, unable to reach at this time. HIPAA compliant voicemail message left requesting return call to pre surgical testing department.

## 2023-10-22 ENCOUNTER — Ambulatory Visit (HOSPITAL_COMMUNITY): Admission: RE | Admit: 2023-10-22 | Source: Home / Self Care | Admitting: Gastroenterology

## 2023-10-22 ENCOUNTER — Encounter (HOSPITAL_COMMUNITY): Admission: RE | Payer: Self-pay | Source: Home / Self Care

## 2023-10-22 SURGERY — COLONOSCOPY
Anesthesia: Monitor Anesthesia Care

## 2023-12-14 DIAGNOSIS — M818 Other osteoporosis without current pathological fracture: Secondary | ICD-10-CM | POA: Diagnosis not present

## 2023-12-14 DIAGNOSIS — Z0001 Encounter for general adult medical examination with abnormal findings: Secondary | ICD-10-CM | POA: Diagnosis not present

## 2023-12-14 DIAGNOSIS — Z1389 Encounter for screening for other disorder: Secondary | ICD-10-CM | POA: Diagnosis not present

## 2023-12-14 DIAGNOSIS — G40834 Dravet syndrome, intractable, without status epilepticus: Secondary | ICD-10-CM | POA: Diagnosis not present

## 2023-12-14 DIAGNOSIS — Z6824 Body mass index (BMI) 24.0-24.9, adult: Secondary | ICD-10-CM | POA: Diagnosis not present

## 2023-12-14 DIAGNOSIS — Z1331 Encounter for screening for depression: Secondary | ICD-10-CM | POA: Diagnosis not present

## 2023-12-14 DIAGNOSIS — N3090 Cystitis, unspecified without hematuria: Secondary | ICD-10-CM | POA: Diagnosis not present

## 2023-12-30 ENCOUNTER — Emergency Department (HOSPITAL_COMMUNITY)

## 2023-12-30 ENCOUNTER — Other Ambulatory Visit: Payer: Self-pay

## 2023-12-30 ENCOUNTER — Inpatient Hospital Stay (HOSPITAL_COMMUNITY)
Admission: EM | Admit: 2023-12-30 | Discharge: 2024-01-07 | DRG: 481 | Disposition: A | Discharge status: Home Health | Attending: Family Medicine | Admitting: Family Medicine

## 2023-12-30 DIAGNOSIS — S72332A Displaced oblique fracture of shaft of left femur, initial encounter for closed fracture: Secondary | ICD-10-CM | POA: Diagnosis not present

## 2023-12-30 DIAGNOSIS — M7989 Other specified soft tissue disorders: Secondary | ICD-10-CM | POA: Diagnosis not present

## 2023-12-30 DIAGNOSIS — Z9889 Other specified postprocedural states: Secondary | ICD-10-CM | POA: Diagnosis not present

## 2023-12-30 DIAGNOSIS — Y92511 Restaurant or cafe as the place of occurrence of the external cause: Secondary | ICD-10-CM

## 2023-12-30 DIAGNOSIS — G40834 Dravet syndrome, intractable, without status epilepticus: Secondary | ICD-10-CM | POA: Diagnosis present

## 2023-12-30 DIAGNOSIS — D72829 Elevated white blood cell count, unspecified: Secondary | ICD-10-CM

## 2023-12-30 DIAGNOSIS — D649 Anemia, unspecified: Secondary | ICD-10-CM | POA: Diagnosis not present

## 2023-12-30 DIAGNOSIS — N390 Urinary tract infection, site not specified: Secondary | ICD-10-CM | POA: Diagnosis present

## 2023-12-30 DIAGNOSIS — Y9301 Activity, walking, marching and hiking: Secondary | ICD-10-CM | POA: Diagnosis present

## 2023-12-30 DIAGNOSIS — T402X5A Adverse effect of other opioids, initial encounter: Secondary | ICD-10-CM | POA: Diagnosis not present

## 2023-12-30 DIAGNOSIS — M818 Other osteoporosis without current pathological fracture: Secondary | ICD-10-CM

## 2023-12-30 DIAGNOSIS — D509 Iron deficiency anemia, unspecified: Secondary | ICD-10-CM | POA: Diagnosis present

## 2023-12-30 DIAGNOSIS — Z151 Genetic susceptibility to epilepsy and neurodevelopmental disorders: Secondary | ICD-10-CM | POA: Diagnosis not present

## 2023-12-30 DIAGNOSIS — K5900 Constipation, unspecified: Secondary | ICD-10-CM | POA: Diagnosis not present

## 2023-12-30 DIAGNOSIS — D696 Thrombocytopenia, unspecified: Secondary | ICD-10-CM

## 2023-12-30 DIAGNOSIS — S72432A Displaced fracture of medial condyle of left femur, initial encounter for closed fracture: Secondary | ICD-10-CM | POA: Diagnosis not present

## 2023-12-30 DIAGNOSIS — M2540 Effusion, unspecified joint: Secondary | ICD-10-CM | POA: Diagnosis not present

## 2023-12-30 DIAGNOSIS — Z7982 Long term (current) use of aspirin: Secondary | ICD-10-CM | POA: Diagnosis not present

## 2023-12-30 DIAGNOSIS — R6250 Unspecified lack of expected normal physiological development in childhood: Secondary | ICD-10-CM | POA: Diagnosis not present

## 2023-12-30 DIAGNOSIS — Z888 Allergy status to other drugs, medicaments and biological substances status: Secondary | ICD-10-CM | POA: Diagnosis not present

## 2023-12-30 DIAGNOSIS — R933 Abnormal findings on diagnostic imaging of other parts of digestive tract: Secondary | ICD-10-CM | POA: Diagnosis present

## 2023-12-30 DIAGNOSIS — R739 Hyperglycemia, unspecified: Secondary | ICD-10-CM | POA: Diagnosis not present

## 2023-12-30 DIAGNOSIS — R404 Transient alteration of awareness: Secondary | ICD-10-CM | POA: Diagnosis not present

## 2023-12-30 DIAGNOSIS — D6959 Other secondary thrombocytopenia: Secondary | ICD-10-CM | POA: Diagnosis present

## 2023-12-30 DIAGNOSIS — S3210XA Unspecified fracture of sacrum, initial encounter for closed fracture: Secondary | ICD-10-CM | POA: Diagnosis not present

## 2023-12-30 DIAGNOSIS — S72402A Unspecified fracture of lower end of left femur, initial encounter for closed fracture: Secondary | ICD-10-CM | POA: Diagnosis not present

## 2023-12-30 DIAGNOSIS — R569 Unspecified convulsions: Secondary | ICD-10-CM | POA: Diagnosis not present

## 2023-12-30 DIAGNOSIS — S72452A Displaced supracondylar fracture without intracondylar extension of lower end of left femur, initial encounter for closed fracture: Secondary | ICD-10-CM | POA: Diagnosis not present

## 2023-12-30 DIAGNOSIS — W102XXA Fall (on)(from) incline, initial encounter: Secondary | ICD-10-CM | POA: Diagnosis present

## 2023-12-30 DIAGNOSIS — G40B19 Juvenile myoclonic epilepsy, intractable, without status epilepticus: Secondary | ICD-10-CM | POA: Diagnosis not present

## 2023-12-30 DIAGNOSIS — E876 Hypokalemia: Secondary | ICD-10-CM | POA: Diagnosis not present

## 2023-12-30 DIAGNOSIS — S79929A Unspecified injury of unspecified thigh, initial encounter: Secondary | ICD-10-CM | POA: Diagnosis not present

## 2023-12-30 DIAGNOSIS — M80052A Age-related osteoporosis with current pathological fracture, left femur, initial encounter for fracture: Principal | ICD-10-CM | POA: Diagnosis present

## 2023-12-30 DIAGNOSIS — S7292XA Unspecified fracture of left femur, initial encounter for closed fracture: Principal | ICD-10-CM | POA: Diagnosis present

## 2023-12-30 DIAGNOSIS — M25552 Pain in left hip: Secondary | ICD-10-CM | POA: Diagnosis not present

## 2023-12-30 DIAGNOSIS — F419 Anxiety disorder, unspecified: Secondary | ICD-10-CM | POA: Diagnosis not present

## 2023-12-30 DIAGNOSIS — S80919A Unspecified superficial injury of unspecified knee, initial encounter: Secondary | ICD-10-CM | POA: Diagnosis not present

## 2023-12-30 DIAGNOSIS — S72492A Other fracture of lower end of left femur, initial encounter for closed fracture: Secondary | ICD-10-CM | POA: Diagnosis not present

## 2023-12-30 DIAGNOSIS — R531 Weakness: Secondary | ICD-10-CM | POA: Diagnosis not present

## 2023-12-30 DIAGNOSIS — T426X5A Adverse effect of other antiepileptic and sedative-hypnotic drugs, initial encounter: Secondary | ICD-10-CM | POA: Diagnosis present

## 2023-12-30 DIAGNOSIS — D62 Acute posthemorrhagic anemia: Secondary | ICD-10-CM | POA: Diagnosis not present

## 2023-12-30 DIAGNOSIS — S0081XA Abrasion of other part of head, initial encounter: Secondary | ICD-10-CM | POA: Diagnosis present

## 2023-12-30 DIAGNOSIS — W19XXXA Unspecified fall, initial encounter: Secondary | ICD-10-CM | POA: Diagnosis not present

## 2023-12-30 DIAGNOSIS — B9620 Unspecified Escherichia coli [E. coli] as the cause of diseases classified elsewhere: Secondary | ICD-10-CM | POA: Diagnosis present

## 2023-12-30 DIAGNOSIS — S72422A Displaced fracture of lateral condyle of left femur, initial encounter for closed fracture: Secondary | ICD-10-CM | POA: Diagnosis not present

## 2023-12-30 DIAGNOSIS — Z87442 Personal history of urinary calculi: Secondary | ICD-10-CM | POA: Diagnosis not present

## 2023-12-30 DIAGNOSIS — Z79899 Other long term (current) drug therapy: Secondary | ICD-10-CM | POA: Diagnosis not present

## 2023-12-30 DIAGNOSIS — G40909 Epilepsy, unspecified, not intractable, without status epilepticus: Secondary | ICD-10-CM

## 2023-12-30 DIAGNOSIS — N200 Calculus of kidney: Secondary | ICD-10-CM | POA: Diagnosis not present

## 2023-12-30 DIAGNOSIS — R600 Localized edema: Secondary | ICD-10-CM | POA: Diagnosis not present

## 2023-12-30 DIAGNOSIS — K5903 Drug induced constipation: Secondary | ICD-10-CM | POA: Diagnosis not present

## 2023-12-30 DIAGNOSIS — Z7401 Bed confinement status: Secondary | ICD-10-CM | POA: Diagnosis not present

## 2023-12-30 DIAGNOSIS — S72352A Displaced comminuted fracture of shaft of left femur, initial encounter for closed fracture: Secondary | ICD-10-CM | POA: Diagnosis not present

## 2023-12-30 DIAGNOSIS — S72302A Unspecified fracture of shaft of left femur, initial encounter for closed fracture: Secondary | ICD-10-CM | POA: Diagnosis not present

## 2023-12-30 DIAGNOSIS — Z4682 Encounter for fitting and adjustment of non-vascular catheter: Secondary | ICD-10-CM | POA: Diagnosis not present

## 2023-12-30 DIAGNOSIS — S301XXA Contusion of abdominal wall, initial encounter: Secondary | ICD-10-CM | POA: Diagnosis not present

## 2023-12-30 LAB — URINALYSIS, W/ REFLEX TO CULTURE (INFECTION SUSPECTED)
Bilirubin Urine: NEGATIVE
Glucose, UA: NEGATIVE mg/dL
Hgb urine dipstick: NEGATIVE
Ketones, ur: NEGATIVE mg/dL
Nitrite: NEGATIVE
Protein, ur: NEGATIVE mg/dL
Specific Gravity, Urine: 1.019 (ref 1.005–1.030)
WBC, UA: >50 WBC/hpf (ref 0–5)
pH: 6 (ref 5.0–8.0)

## 2023-12-30 LAB — BASIC METABOLIC PANEL WITH GFR
Anion gap: 11 (ref 5–15)
BUN: 17 mg/dL (ref 6–20)
CO2: 24 mmol/L (ref 22–32)
Calcium: 9.2 mg/dL (ref 8.9–10.3)
Chloride: 101 mmol/L (ref 98–111)
Creatinine, Ser: 0.56 mg/dL (ref 0.44–1.00)
GFR, Estimated: >60 mL/min (ref >60)
Glucose, Bld: 150 mg/dL — ABNORMAL HIGH (ref 70–99)
Potassium: 3.7 mmol/L (ref 3.5–5.1)
Sodium: 136 mmol/L (ref 135–145)

## 2023-12-30 LAB — CBC
HCT: 33.4 % — ABNORMAL LOW (ref 36.0–46.0)
Hemoglobin: 10.4 g/dL — ABNORMAL LOW (ref 12.0–15.0)
MCH: 28.9 pg (ref 26.0–34.0)
MCHC: 31.1 g/dL (ref 30.0–36.0)
MCV: 92.8 fL (ref 80.0–100.0)
Platelets: 113 K/uL — ABNORMAL LOW (ref 150–400)
RBC: 3.6 MIL/uL — ABNORMAL LOW (ref 3.87–5.11)
RDW: 13.8 % (ref 11.5–15.5)
WBC: 11.6 K/uL — ABNORMAL HIGH (ref 4.0–10.5)
nRBC: 0 % (ref 0.0–0.2)

## 2023-12-30 MED ORDER — ACETAMINOPHEN 325 MG PO TABS
650.0000 mg | ORAL_TABLET | Freq: Once | ORAL | Status: AC
  Administered 2023-12-30: 650 mg via ORAL
  Filled 2023-12-30: qty 2

## 2023-12-30 MED ORDER — ZONISAMIDE 100 MG PO CAPS
100.0000 mg | ORAL_CAPSULE | Freq: Every day | ORAL | Status: DC
  Administered 2023-12-30 – 2024-01-06 (×8): 100 mg via ORAL
  Filled 2023-12-30 (×11): qty 1

## 2023-12-30 MED ORDER — CANNABIDIOL 100 MG/ML PO SOLN
600.0000 mg | Freq: Two times a day (BID) | ORAL | Status: DC
Start: 2023-12-30 — End: 2024-01-04
  Administered 2023-12-30 – 2024-01-03 (×9): 600 mg via ORAL
  Filled 2023-12-30 (×14): qty 6

## 2023-12-30 MED ORDER — ZONISAMIDE 25 MG PO CAPS
75.0000 mg | ORAL_CAPSULE | Freq: Every day | ORAL | Status: DC
  Administered 2023-12-30 – 2024-01-06 (×8): 75 mg via ORAL
  Filled 2023-12-30 (×13): qty 3

## 2023-12-30 MED ORDER — ACETAMINOPHEN 325 MG PO TABS
650.0000 mg | ORAL_TABLET | Freq: Four times a day (QID) | ORAL | Status: DC | PRN
  Administered 2024-01-01: 650 mg via ORAL
  Filled 2023-12-30: qty 2

## 2023-12-30 MED ORDER — METHENAMINE HIPPURATE 1 G PO TABS: 1.0000 g | ORAL_TABLET | Freq: Two times a day (BID) | ORAL | Status: DC

## 2023-12-30 MED ORDER — FENFLURAMINE HCL 2.2 MG/ML PO SOLN
3.0000 mL | Freq: Three times a day (TID) | ORAL | Status: DC
  Administered 2023-12-30 – 2024-01-07 (×24): 3 mL via ORAL
  Filled 2023-12-30 (×28): qty 3

## 2023-12-30 MED ORDER — SODIUM CHLORIDE 0.9 % IV SOLN
1.0000 g | Freq: Once | INTRAVENOUS | Status: AC
  Administered 2023-12-30: 1 g via INTRAVENOUS
  Filled 2023-12-30: qty 10

## 2023-12-30 MED ORDER — HYDROCODONE-ACETAMINOPHEN 5-325 MG PO TABS
1.0000 | ORAL_TABLET | Freq: Once | ORAL | Status: AC
  Administered 2023-12-30: 1 via ORAL
  Filled 2023-12-30: qty 1

## 2023-12-30 MED ORDER — MORPHINE SULFATE (PF) 2 MG/ML IV SOLN
1.0000 mg | INTRAVENOUS | Status: DC | PRN
  Administered 2023-12-30 – 2023-12-31 (×3): 1 mg via INTRAVENOUS
  Filled 2023-12-30 (×3): qty 1

## 2023-12-30 MED ORDER — HYDROMORPHONE HCL 1 MG/ML IJ SOLN: 0.5000 mg | INTRAMUSCULAR | Status: DC | PRN

## 2023-12-30 MED ORDER — MIDAZOLAM HCL 2 MG/2ML IJ SOLN: 0.5000 mg | Freq: Four times a day (QID) | INTRAMUSCULAR | Status: DC | PRN

## 2023-12-30 MED ORDER — HYDROCODONE-ACETAMINOPHEN 5-325 MG PO TABS
1.0000 | ORAL_TABLET | Freq: Four times a day (QID) | ORAL | Status: DC | PRN
  Administered 2023-12-31 (×2): 1 via ORAL
  Filled 2023-12-30 (×3): qty 1

## 2023-12-30 MED ORDER — DIVALPROEX SODIUM 125 MG PO CSDR
125.0000 mg | DELAYED_RELEASE_CAPSULE | ORAL | Status: DC
Start: 2023-12-30 — End: 2024-01-07
  Administered 2023-12-30 – 2024-01-07 (×32): 125 mg via ORAL
  Filled 2023-12-30 (×38): qty 1

## 2023-12-30 MED ORDER — MORPHINE SULFATE (PF) 2 MG/ML IV SOLN: 2.0000 mg | INTRAVENOUS | Status: DC | PRN

## 2023-12-30 MED ORDER — ZONISAMIDE 25 MG PO CAPS
175.0000 mg | ORAL_CAPSULE | Freq: Every day | ORAL | Status: DC
Start: 2023-12-30 — End: 2023-12-30
  Filled 2023-12-30: qty 7

## 2023-12-30 MED ORDER — METHENAMINE MANDELATE 0.5 G PO TABS
1000.0000 mg | ORAL_TABLET | Freq: Two times a day (BID) | ORAL | Status: DC
  Filled 2023-12-30 (×3): qty 2

## 2023-12-30 MED ORDER — MORPHINE SULFATE (PF) 4 MG/ML IV SOLN: 4.0000 mg | Freq: Once | INTRAVENOUS | Status: DC

## 2023-12-30 MED ORDER — OXYCODONE HCL 5 MG PO TABS: 5.0000 mg | ORAL_TABLET | Freq: Four times a day (QID) | ORAL | Status: DC | PRN

## 2023-12-30 NOTE — H&P (Signed)
 History and Physical    Molly Duffy FMW:[id number] DOB: [birth year] DOA: 12/30/2023  PCP: Toribio Jerel MATSU, MD   I have personally briefly reviewed patient's old medical records in [facility]  Chief Complaint:  accidental fall   HPI: Molly Duffy is a 53 y.o. 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  Patient is not able to provide history, father at bedside, denies any recent illness.  Father reports the fall is accidental .   ED Course:   Data reviewed: Blood pressure 134/62, pulse 68, temperature 97.6 F (36.4 C), temperature source Oral, resp. rate 18, height 5' 6 (1.676 m), weight 69.9 kg, SpO2 99%.   CBC    Component Value Date/Time   WBC 11.6 (H) 12/30/2023 1525   RBC 3.60 (L) 12/30/2023 1525   HGB 10.4 (L) 12/30/2023 1525   HCT 33.4 (L) 12/30/2023 1525   PLT 113 (L) 12/30/2023 1525   MCV 92.8 12/30/2023 1525   MCH 28.9 12/30/2023 1525   MCHC 31.1 12/30/2023 1525   RDW 13.8 12/30/2023 1525      Latest Ref Rng & Units 12/30/2023    3:25 PM  BMP  Glucose 70 - 99 mg/dL 849   BUN 6 - 20 mg/dL 17   Creatinine 9.55 - 1.00 mg/dL 9.43   Sodium 864 - 854 mmol/L 136   Potassium 3.5 - 5.1 mmol/L 3.7   Chloride 98 - 111 mmol/L 101   CO2 22 - 32 mmol/L 24   Calcium 8.9 - 10.3 mg/dL 9.2        CT FEMUR LEFT WO CONTRAST  Final Result    DG Hip Unilat With Pelvis 2-3 Views Left  Final Result    DG Knee Complete 4 Views Left  Final Result     IMPRESSION: 1. Comminuted fracture of the distal femoral diaphysis and metaphysis. The most cranial portion of the fracture is 17 cm proximal to the knee joint. There is no dislocation. 2. Small joint effusion. 3. Intramuscular and deep fascial plane hemorrhage surrounding the fractures.  Medications Ordered in the ED  HYDROcodone -acetaminophen  (NORCO/VICODIN) 5-325 MG per tablet 1  tablet (has no administration in time range)  acetaminophen  (TYLENOL ) tablet 650 mg (650 mg Oral Given 12/30/23 1338)        Review of Systems: As per HPI otherwise all other systems reviewed and are negative.   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 She has never used smokeless tobacco. She reports that she does not drink alcohol . No history on file for drug use.  Allergies  Allergen Reactions   Levetiracetam Other (See Comments)    Makes seizures worse Seizures worse.   Vigabatrin Other (See Comments)    Nonconvulsive status.    Levetiracetam     Makes seizures worse   Phenobarbital Nausea And Vomiting    Makes seizures worse. Adverse effects   Phenytoin Sodium Extended    Tiagabine Hcl     Makes seizures worse   Tiagabine Hcl Other (See Comments)  Makes seizures worse    No family history on file.  Prior to Admission medications   Medication Sig Start Date End Date Taking? Authorizing Provider  Cholecalciferol (VITAMIN D-1000 MAX ST) 25 MCG (1000 UT) tablet Take 1,000 Units by mouth daily. 06/24/11   [provider]  Cranberry 200 MG CAPS Take 1 capsule by mouth in the morning and at bedtime. 07/16/12   [provider]  Cranberry 450 MG TABS Take 1 tablet by mouth in the morning and at bedtime.    [provider]  divalproex  (DEPAKOTE  SPRINKLE) 125 MG capsule Take 500 mg by mouth AC breakfast. Take with the 250 mg Depakote  to equal 375 mg TID    [provider]  EPIDIOLEX  100 MG/ML solution Take 6 mLs by mouth in the morning and at bedtime. 05/17/21   [provider]  estradiol (ESTRACE) 0.1 MG/GM vaginal cream Place 0.1 g vaginally 3 (three) times  a week.    [provider]  FINTEPLA  2.2 MG/ML SOLN Take 3 mLs by mouth in the morning, at noon, and at bedtime. 06/13/21   [provider]  FORTEO 600 MCG/2.4ML SOPN Inject 2.4 mLs into the muscle at bedtime. 07/06/21   [provider]  LORazepam  (ATIVAN ) 1 MG tablet Take 1 mg by mouth as needed. One or two for seizures as needed.    [provider]  lorazepam  (ATIVAN ) 4 MG/ML injection 0.5 ml ( 2 mg) in one nostril prn prolonged seizure Patient taking differently: Inject 0.5 mLs into the vein as needed. 0.5 ml ( 2 mg) in one nostril prn prolonged seizure 12/06/11   Cyrena Donnice DEL, MD  midazolam  (VERSED ) 50 MG/10ML SOLN injection See admin instructions. Use 1 mL per nostril for seizures lasting longer than 5 mins 06/09/19   [provider]  Multiple Vitamin (MULTIVITAMIN) tablet Take 1 tablet by mouth daily.    [provider]  ZONEGRAN  100 MG capsule See admin instructions. TAKE 1 CAPSULE AT NIGHT, WITH 3 CAPSULES OF 25 MG AT NIGHT FOR TOTAL DOSE OF 175MG  06/06/19   [provider]  zonisamide  (ZONEGRAN ) 25 MG capsule Take 75 mg by mouth daily.    [provider]    Physical Exam: Vitals:   12/30/23 1314 12/30/23 1327  BP:  134/62  Pulse:  68  Resp:  18  Temp:  97.6 F (36.4 C)  TempSrc:  Oral  SpO2:  99%  Weight: 69.9 kg   Height: 5' 6 (1.676 m)     Constitutional: NAD, calm, comfortable Eyes: PERRL, lids and conjunctivae normal Respiratory: clear to auscultation bilaterally, no wheezing, no crackles. Normal respiratory effort. No accessory muscle use.  Cardiovascular: Regular rate and rhythm,  No extremity edema. 2+ pedal pulses. No carotid bruits.  Abdomen: no tenderness, not distended, Bowel sounds positive.  Musculoskeletal: left leg in immobilizer   Skin: no rashes, lesions, ulcers. No induration Neurologic: drowsy after pain meds, no agitation, recognize family Psychiatric: Normal judgment and insight.  Alert and oriented x 3. Normal mood.    Radiological Exams on Admission: DG Knee Complete 4 Views Left Result Date: 12/30/2023 CLINICAL DATA:  Fall, knee pain EXAM: LEFT KNEE - COMPLETE 4+ VIEW COMPARISON:  None Available. FINDINGS: Comminuted fracture of the mid to distal femur includes a longitudinal fracture extending obliquely from the mid to distal shaft to the lateral epicondyle, extending into a transverse distal femoral metaphyseal fracture. I cannot exclude extension to weight-bearing articular surface in the knee, with  lateral condylar involvement a possibility. Consider CT of the femur for definitive characterization. IMPRESSION: 1. Comminuted fracture of the mid to distal femur, with a longitudinal fracture extending obliquely from the mid to distal shaft to the lateral epicondyle, extending into a transverse distal femoral metaphyseal fracture. I cannot exclude extension to the weight-bearing articular surface in the knee, with lateral condylar involvement a possibility. Consider CT of the left femur. Electronically Signed   By: Ryan Salvage M.D.   On: 12/30/2023 14:57   DG Hip Unilat With Pelvis 2-3 Views Left Result Date: 12/30/2023 CLINICAL DATA:  Fall, lower extremity pain with movement EXAM: DG HIP (WITH OR WITHOUT PELVIS) 2-3V LEFT COMPARISON:  06/11/2019 FINDINGS: The proximal most margin of a mid femoral fracture is visible on the frontal projection, extending distally. No fracture of the femoral neck/hip region is identified. IMPRESSION: 1. The proximal most margin of a mid femoral fracture is visible on the frontal projection, extending distally. Electronically Signed   By: Ryan Salvage M.D.   On: 12/30/2023 14:54      Assessment/Plan Active Problems:   * No active hospital problems. *   Comminuted fracture of the distal femoral diaphysis and Metaphysis, left  ortho recommend admit to [facility], npo after midnight Fall precaution   Leukocytosis Prior h/o frequent UTI  , father reports no UTI in the last year since started on methenamine  , though UA is concerning for UTI, empirically started on Rocephin , follow-up urine culture   Normocytic anemia Hgb was normal 12.6 in 07/2023 Does has Intramuscular and deep fascial plane hemorrhage surrounding the Fractures .check iron level, Monitor hgb, Father reports she has been referred to discuss screening  colonoscopy  next week.   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 Last seizure was a year ago, no recent seizure medication changes Continue all Home meds Seizure precaution      DVT prophylaxis:  scd's for now   Code Status:   Full code  Family Communication:  Father at bedside       Consults called:  Ortho Dr Sherida  recommend to admit patient to [place]  Admission status:  Inpatient, med surg  Severity of Illness:   The appropriate patient status for this patient is INPATIENT due to history and comorbidities, severity of illness, required intensity of service to ensure the patient's safety and to avoid risk of adverse events/further clinical deterioration.  Severity of illness/comorbidities: Distal femur fracture, possible UTI Intensity of service: tests, high frequency of surveillance, interventions It is not anticipated that the patient will be medically stable for discharge from the hospital within 2 midnights of admission.    Voice Recognition Betti dictation system was used to create this note, attempts have been made to correct errors. Please contact the author with questions and/or clarifications.  Ileana Cummins MD PhD FACP [facility]  How to contact the [facility] Attending or Consulting provider 7A - 7P or covering provider during after hours 7P -7A, for this patient?   Check the care team in [facility] and look for a) attending/consulting [facility] provider listed and b) the [facility] team listed Log into  [email or web address] and use [facility] universal password to access. If you do not have the password, please contact the hospital operator. Locate the [facility] provider you are looking for under [facility] and page to a number that you can be directly reached. If you still have difficulty  reaching the provider, please page the [facility] (Director on Call) for the Hospitalists listed on amion for assistance.  12/30/2023, 4:59 PM

## 2023-12-30 NOTE — ED Triage Notes (Signed)
 Pt BIBA from [employer], pt fell onto left knee, scratched left side of face. Pt has a vest to assist with mobility but family could not catch her fast enough. Possible rotation. No thinners, no head trauma. Pain with movement  110/60 68 hr 98% RA

## 2023-12-30 NOTE — ED Notes (Signed)
 Patients mother said she would let me know when the patient wakes up for her pain medication

## 2023-12-30 NOTE — Progress Notes (Signed)
 Orthopedic Tech Progress Note Patient Details:  Molly Duffy [birth year] [id number]  Ortho Devices Type of Ortho Device: Knee Immobilizer Ortho Device/Splint Location: LLE Ortho Device/Splint Interventions: Application   Post Interventions Patient Tolerated: Well  Massie BRAVO [name] 12/30/2023, 5:52 PM

## 2023-12-30 NOTE — ED Notes (Signed)
 Father still stated no need for pain meds yet

## 2023-12-30 NOTE — Progress Notes (Addendum)
 2046 Pt has Rt chest port insitu x 12 years now, was placed for Seizure meds. Last had seizure ~ 3 weeks ago, as per pt's mother report. Pt's mother also stated she is going to medicate pt as she has to get her anti-seizure meds at 2100. RN educated mother that to prevent medication overdose it is not recommended/ allowed for her to medicate the pt while here in the hospital. Understanding verbalized.  Pt has to take name brand medication for seizure as per mother's report.  2100 Pt with mother at her bedside informed to KEEP NPO after midnight for possible OR in AM.  Understanding verbalized.

## 2023-12-30 NOTE — ED Provider Notes (Signed)
 [place] [facility]ERG[facility] DEPARTMENT AT [facility] Provider Note   CSN: [id number] Arrival date & time: 12/30/23  1259     Patient presents with: Fall and Knee Pain   Molly Duffy is a 53 y.o. 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  She has a vest to assist with mobility but family could not catch her.  Questionable rotation indicated by EMS prior to arrival.  She reports significant pain with bending the knee.    Fall  Knee Pain      Prior to Admission medications   Medication Sig Start Date End Date Taking? Authorizing Provider  Cholecalciferol (VITAMIN D -1000 MAX ST) 25 MCG (1000 UT) tablet Take 1,000 Units by mouth daily. 06/24/11   [provider]  Cranberry 200 MG CAPS Take 1 capsule by mouth in the morning and at bedtime. 07/16/12   [provider]  Cranberry 450 MG TABS Take 1 tablet by mouth in the morning and at bedtime.    [provider]  divalproex  (DEPAKOTE  SPRINKLE) 125 MG capsule Take 500 mg by mouth AC breakfast. Take with the 250 mg Depakote  to equal 375 mg TID    [provider]  EPIDIOLEX  100 MG/ML solution Take 6 mLs by mouth in the morning and at bedtime. 05/17/21   [provider]  estradiol  (ESTRACE ) 0.1 MG/GM vaginal cream Place 0.1 g vaginally 3 (three) times a week.    [provider]  FINTEPLA  2.2 MG/ML SOLN Take 3 mLs by mouth in the morning, at noon, and at bedtime. 06/13/21   [provider]  FORTEO 600 MCG/2.4ML SOPN Inject 2.4 mLs into the muscle at bedtime. 07/06/21   [provider]  LORazepam  (ATIVAN ) 1 MG tablet Take 1 mg by mouth as needed. One or two for seizures as needed.    [provider]  lorazepam  (ATIVAN ) 4 MG/ML injection 0.5 ml ( 2 mg) in one nostril prn prolonged seizure Patient taking differently: Inject 0.5 mLs into the vein as needed. 0.5 ml ( 2 mg) in  one nostril prn prolonged seizure 12/06/11   Cyrena Donnice DEL, MD  midazolam  (VERSED ) 50 MG/10ML SOLN injection See admin instructions. Use 1 mL per nostril for seizures lasting longer than 5 mins 06/09/19   [provider]  Multiple Vitamin (MULTIVITAMIN) tablet Take 1 tablet by mouth daily.    [provider]  ZONEGRAN  100 MG capsule See admin instructions. TAKE 1 CAPSULE AT NIGHT, WITH 3 CAPSULES OF 25 MG AT NIGHT FOR TOTAL DOSE OF 175MG  06/06/19   [provider]  zonisamide  (ZONEGRAN ) 25 MG capsule Take 75 mg by mouth daily.    [provider]    Allergies: Levetiracetam, Vigabatrin, Levetiracetam, Phenobarbital, Phenytoin sodium extended, Tiagabine hcl, and Tiagabine hcl    Review of Systems  All other systems reviewed and are negative.   Updated Vital Signs BP 134/62 (BP Location: Left Arm)   Pulse 68   Temp 97.6 F (36.4 C) (Oral)   Resp 18   Ht 5' 6 (1.676 m)   Wt 69.9 kg   SpO2 99%   BMI 24.86 kg/m   Physical Exam Vitals and nursing note reviewed.  Constitutional:      General: She is not in acute distress.    Appearance: Normal appearance.  HENT:     Head: Normocephalic and atraumatic.  Eyes:     General:  Right eye: No discharge.        Left eye: No discharge.  Cardiovascular:     Rate and Rhythm: Normal rate and regular rhythm.  Pulmonary:     Effort: Pulmonary effort is normal. No respiratory distress.  Musculoskeletal:        General: No deformity.     Comments: Some mild soft tissue swelling, tenderness to palpation of the left knee.  Pain with range of motion of the affected extremity.  No obvious step-off, deformity.  No leg length discrepancy on my exam.  Very mild tenderness over the left hip.  Skin:    General: Skin is warm and dry.  Neurological:     Mental Status: She is alert. Mental status is at baseline.  Psychiatric:        Mood and Affect: Mood normal.        Behavior: Behavior normal.     (all  labs ordered are listed, but only abnormal results are displayed) Labs Reviewed  CBC  BASIC METABOLIC PANEL WITH GFR    EKG: None  Radiology: DG Knee Complete 4 Views Left Result Date: 12/30/2023 CLINICAL DATA:  Fall, knee pain EXAM: LEFT KNEE - COMPLETE 4+ VIEW COMPARISON:  None Available. FINDINGS: Comminuted fracture of the mid to distal femur includes a longitudinal fracture extending obliquely from the mid to distal shaft to the lateral epicondyle, extending into a transverse distal femoral metaphyseal fracture. I cannot exclude extension to weight-bearing articular surface in the knee, with lateral condylar involvement a possibility. Consider CT of the femur for definitive characterization. IMPRESSION: 1. Comminuted fracture of the mid to distal femur, with a longitudinal fracture extending obliquely from the mid to distal shaft to the lateral epicondyle, extending into a transverse distal femoral metaphyseal fracture. I cannot exclude extension to the weight-bearing articular surface in the knee, with lateral condylar involvement a possibility. Consider CT of the left femur. Electronically Signed   By: Ryan Salvage M.D.   On: 12/30/2023 14:57   DG Hip Unilat With Pelvis 2-3 Views Left Result Date: 12/30/2023 CLINICAL DATA:  Fall, lower extremity pain with movement EXAM: DG HIP (WITH OR WITHOUT PELVIS) 2-3V LEFT COMPARISON:  06/11/2019 FINDINGS: The proximal most margin of a mid femoral fracture is visible on the frontal projection, extending distally. No fracture of the femoral neck/hip region is identified. IMPRESSION: 1. The proximal most margin of a mid femoral fracture is visible on the frontal projection, extending distally. Electronically Signed   By: Ryan Salvage M.D.   On: 12/30/2023 14:54     Procedures   Medications Ordered in the ED  acetaminophen  (TYLENOL ) tablet 650 mg (650 mg Oral Given 12/30/23 1338)    Clinical Course as of 12/30/23 1516  Sun Dec 30, 2023   1426 Apparent distal femur fracture [CP]    Clinical Course User Index [CP] Molly Sherlean DEL, PA-C                                 Medical Decision Making Amount and/or Complexity of Data Reviewed Labs: ordered. Radiology: ordered.  Risk OTC drugs.   This patient is a 53 y.o. female  who presents to the ED for concern of fall, left knee / hip pain.   Differential diagnoses prior to evaluation: The emergent differential diagnosis includes, but is not limited to, acute fracture, dislocation. This is not an exhaustive differential.   Past Medical History /  Co-morbidities / Social History: Dravet syndrome  Additional history: Chart reviewed. Pertinent results include: Reviewed outpatient neurologic evaluations, including lab work, imaging.  Physical Exam: Physical exam performed. The pertinent findings include: Some mild soft tissue swelling, tenderness to palpation of the left knee.  Pain with range of motion of the affected extremity.  No obvious step-off, deformity.  No leg length discrepancy on my exam.  Very mild tenderness over the left hip.   DP, PT pulses are 2+ in bilateral lower extremities.  She is at her neurologic baseline, developmental delay at baseline.  Lab Tests/Imaging studies: I personally interpreted labs/imaging and the pertinent results include: Pending CBC, BMP, plain film radiograph of the left knee, left hip shows.  1. Comminuted fracture of the mid to distal femur, with a  longitudinal fracture extending obliquely from the mid to distal  shaft to the lateral epicondyle, extending into a transverse distal  femoral metaphyseal fracture. I cannot exclude extension to the  weight-bearing articular surface in the knee, with lateral condylar  involvement a possibility. Consider CT of the left femur.   I agree with the radiologist interpretation.   Medications: I ordered medication including Tylenol  for pain.  I have reviewed the patients home  medicines and have made adjustments as needed.   3:16 PM Care of Molly Duffy transferred to [facility] and Dr. Freddi at the end of my shift as the patient will require reassessment once labs/imaging have resulted. Patient presentation, ED course, and plan of care discussed with review of all pertinent labs and imaging. Please see his/her note for further details regarding further ED course and disposition. Plan at time of handoff is patient likely to need surgery for her left distal femur fracture,, pending her lab work, orthopedic consultation and CT femur for surgical planning. This may be altered or completely changed at the discretion of the oncoming team pending results of further workup.    Final diagnoses:  None    ED Discharge Orders     None          Molly Duffy 12/30/23 1516    Francesca Elsie CROME, MD 12/31/23 1300

## 2023-12-30 NOTE — Consult Note (Signed)
 [facility]  ORTH[facility]AEDIC CONSULTATION  REQUESTING PHYSICIAN: Jerri Keys, MD  ASSESSMENT AND PLAN: 53 y.o. female with the following: Left Distal femur fracture  This patient requires inpatient admission to the hospitalist, to include preoperative clearance and perioperative medical management  - Weight Bearing Status/Activity: NWB Left lower extremity  - Additional recommended labs/tests: Preop Labs: CBC, BMP, PT/INR, Chest XR, and EKG  -VTE Prophylaxis: Please hold prior to OR; to resume POD#1 at the discretion of the primary team  - Pain control: Recommend PO pain medications PRN; judicious use of narcotics  -Procedures: Plan for OR tomorrow once patient has been medically optimized  Plan for Left Femur open reduction internal fixation.  Will discuss with the orthopaedic trauma team for surgical management.   Chief Complaint: Left leg pain  HPI: Molly Duffy is a 53 y.o. 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 Patient is unable to provide history but parents at bedside. Patient primarily uses a wheelchair but does occasionally walk with assistance. No other injuries indicated.  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   Past Surgical History:  Procedure Laterality Date   dental implants     vns implant  2001   has been turned off   Social History   Socioeconomic History   Marital status: Single    Spouse name: Not on file   Number of children: Not on file   Years of education: Not on file   Highest education level: Not on file  Occupational History   Not on file  Tobacco Use   Smoking status: Never   Smokeless tobacco: Never  Substance and Sexual Activity   Alcohol  use: No   Drug use: Not on file   Sexual  activity: Not on file  Other Topics Concern   Not on file  Social History Narrative   Not on file   Social Drivers of Health   Financial Resource Strain: Low Risk  (12/09/2021)   Overall Financial Resource Strain (CARDIA)    Difficulty of Paying Living Expenses: Not hard at all  Food Insecurity: Unknown (09/11/2023)   Received from [facility]   Hunger Vital Sign    Within the past 12 months, you worried that your food would run out before you got money to buy more: Patient unable to answer    Within the past 12 months, the food you bought just didn't last and you didn't have money to get more. : Patient unable to answer  Transportation Needs: Unknown (09/11/2023)   Received from [facility]    In the past 12 months, has lack of reliable transportation kept you from medical appointments, meetings, work or from getting things needed for daily living? : Patient unable to answer  Physical Activity: Not on file  Stress: Not on file  Social Connections: Not on file   No family history on file. Allergies  Allergen Reactions   Levetiracetam Other (See Comments)    Makes seizures worse Seizures worse.   Vigabatrin Other (See Comments)    Nonconvulsive status.    Levetiracetam     Makes seizures worse   Phenobarbital Nausea And Vomiting    Makes seizures worse. Adverse effects   Phenytoin Sodium Extended    Tiagabine Hcl  Makes seizures worse   Tiagabine Hcl Other (See Comments)    Makes seizures worse   Prior to Admission medications   Medication Sig Start Date End Date Taking? Authorizing Provider  Cholecalciferol (VITAMIN D-1000 MAX ST) 25 MCG (1000 UT) tablet Take 1,000 Units by mouth daily. 06/24/11  Yes [provider]  Cranberry 200 MG CAPS Take 1 capsule by mouth in the morning and at bedtime. 07/16/12  Yes [provider]  divalproex  (DEPAKOTE  SPRINKLE) 125 MG capsule Take 500 mg by mouth AC breakfast. Take with the 250 mg Depakote  to  equal 375 mg TID   Yes [provider]  EPIDIOLEX  100 MG/ML solution Take 6 mLs by mouth in the morning and at bedtime. 05/17/21  Yes [provider]  estradiol (ESTRACE) 0.1 MG/GM vaginal cream Place 0.1 g vaginally 3 (three) times a week.   Yes [provider]  FINTEPLA  2.2 MG/ML SOLN Take 3 mLs by mouth in the morning, at noon, and at bedtime. 06/13/21  Yes [provider]  Lactobacillus (PROBIOTIC ACIDOPHILUS PO) Take 1 capsule by mouth daily.   Yes [provider]  LORazepam  (ATIVAN ) 1 MG tablet Take 1 mg by mouth as needed. One or two for seizures as needed.   Yes [provider]  lorazepam  (ATIVAN ) 4 MG/ML injection 0.5 ml ( 2 mg) in one nostril prn prolonged seizure Patient taking differently: Inject 0.5 mLs into the vein as needed. 0.5 ml ( 2 mg) in one nostril prn prolonged seizure 12/06/11  Yes Cyrena Donnice DEL, MD  methenamine  (HIPREX ) 1 g tablet Take 1 g by mouth in the morning and at bedtime. 12/05/23 06/02/24 Yes [provider]  midazolam  (VERSED ) 50 MG/10ML SOLN injection See admin instructions. Use 1 mL per nostril for seizures lasting longer than 5 mins 06/09/19  Yes [provider]  Multiple Vitamin (MULTIVITAMIN) tablet Take 1 tablet by mouth daily.   Yes [provider]  OVER THE COUNTER MEDICATION Take 1 tablet by mouth daily. **Loratadine Unknown strength**   Yes [provider]  OVER THE COUNTER MEDICATION Take 1 tablet by mouth daily. **Vitamin K unknown strength**   Yes [provider]  ZONEGRAN  100 MG capsule See admin instructions. TAKE 1 CAPSULE AT NIGHT, WITH 3 CAPSULES OF 25 MG AT NIGHT FOR TOTAL DOSE OF 175MG  06/06/19  Yes [provider]  zonisamide  (ZONEGRAN ) 25 MG capsule Take 75 mg by mouth daily.   Yes [provider]  Cranberry 450 MG TABS Take 1 tablet by mouth in the morning and at bedtime. Patient not taking: Reported on 12/30/2023    [provider]  FORTEO 600 MCG/2.4ML SOPN Inject 2.4 mLs into the muscle at bedtime. Patient not taking: Reported on 12/30/2023 07/06/21   [provider]    Family History Reviewed and non-contributory, no pertinent history of problems with bleeding or anesthesia    Review of Systems No fevers or chills No numbness or tingling No chest pain No shortness of breath No bowel or bladder dysfunction No GI distress No headaches    OBJECTIVE  Vitals:Patient Vitals for the past 8 hrs:  BP Temp Temp src Pulse Resp SpO2 Height Weight  12/30/23 1714 136/81 98.1 F (36.7 C) Oral 82 18 100 % -- --  12/30/23 1327 134/62 97.6 F (36.4 C) Oral 68 18 99 % -- --  12/30/23 1314 -- -- -- -- -- -- 5' 6 (1.676 m) 69.9 kg   General: No acute  distress, baseline mental status per parents Cardiovascular: Extremities are warm Respiratory: No cyanosis, no use of accessory musculature Skin: No lesions in the area of chief complaint  Neurologic: Sensation intact distally  Psychiatric: Patient is not competent for consent with normal mood and affect Lymphatic: No swelling obvious and reported other than the area involved in the exam below Extremities  Left LE: Knee immobilizer in place. Skin of distal thigh and knee intact. Large effusion and swelling of knee and distal thigh. ROM deferred due to known fracture.  Sensation is intact distally in the sural, saphenous, DP, SP, and plantar nerve distribution. 2+ DP pulse.  Toes are WWP.  Active motion intact in the TA/EHL/GS.    Test Results Imaging CT FEMUR LEFT WO CONTRAST Result Date: 12/30/2023 CLINICAL DATA:  Fracture planning. EXAM: CT OF THE LOWER LEFT EXTREMITY WITHOUT CONTRAST TECHNIQUE: Multidetector CT imaging of the lower left extremity was performed according to the standard protocol. RADIATION DOSE REDUCTION: This exam was performed according to the departmental dose-optimization program which includes automated exposure control,  adjustment of the mA and/or kV according to patient size and/or use of iterative reconstruction technique. COMPARISON:  Left hip x-ray same day FINDINGS: Bones/Joint/Cartilage The bones are osteopenic. Oblique/vertically oriented fracture through the distal femoral diaphysis is seen. The most cranial portion is 17 cm proximal to the knee joint. There is transverse comminuted fracture of the metaphyseal region extending to involve the superior aspect of the lateral femoral condyle. This portion of the fracture appears impacted with fracture fragments distracted up to 15 mm. There is no dislocation. The articulating surfaces of the femoral condyles are not involved in the fracture. Small joint effusion present. Ligaments Suboptimally assessed by CT. Muscles and Tendons There is intramuscular edema and hemorrhage surrounding the fractures. There is deep fascial plane hyperdense fluid posterior to the fracture compatible with hemorrhage. Soft tissues No focal hematoma or fluid collection. IMPRESSION: 1. Comminuted fracture of the distal femoral diaphysis and metaphysis. The most cranial portion of the fracture is 17 cm proximal to the knee joint. There is no dislocation. 2. Small joint effusion. 3. Intramuscular and deep fascial plane hemorrhage surrounding the fractures. Electronically Signed   By: Greig Pique M.D.   On: 12/30/2023 16:57   DG Knee Complete 4 Views Left Result Date: 12/30/2023 CLINICAL DATA:  Fall, knee pain EXAM: LEFT KNEE - COMPLETE 4+ VIEW COMPARISON:  None Available. FINDINGS: Comminuted fracture of the mid to distal femur includes a longitudinal fracture extending obliquely from the mid to distal shaft to the lateral epicondyle, extending into a transverse distal femoral metaphyseal fracture. I cannot exclude extension to weight-bearing articular surface in the knee, with lateral condylar involvement a possibility. Consider CT of the femur for definitive characterization. IMPRESSION: 1.  Comminuted fracture of the mid to distal femur, with a longitudinal fracture extending obliquely from the mid to distal shaft to the lateral epicondyle, extending into a transverse distal femoral metaphyseal fracture. I cannot exclude extension to the weight-bearing articular surface in the knee, with lateral condylar involvement a possibility. Consider CT of the left femur. Electronically Signed   By: Ryan Salvage M.D.   On: 12/30/2023 14:57   DG Hip Unilat With Pelvis 2-3 Views Left Result Date: 12/30/2023 CLINICAL DATA:  Fall, lower extremity pain with movement EXAM: DG HIP (WITH OR WITHOUT PELVIS) 2-3V LEFT COMPARISON:  06/11/2019 FINDINGS: The proximal most margin of a mid femoral fracture is visible on the frontal projection, extending distally. No fracture of  the femoral neck/hip region is identified. IMPRESSION: 1. The proximal most margin of a mid femoral fracture is visible on the frontal projection, extending distally. Electronically Signed   By: Ryan Salvage M.D.   On: 12/30/2023 14:54   Labs cbc Recent Labs    12/30/23 1525  WBC 11.6*  HGB 10.4*  HCT 33.4*  PLT 113*    Labs inflam No results for input(s): CRP in the last 72 hours.  Invalid input(s): ESR  Labs coag No results for input(s): INR, PTT in the last 72 hours.  Invalid input(s): PT  Recent Labs    12/30/23 1525  NA 136  K 3.7  CL 101  CO2 24  GLUCOSE 150*  BUN 17  CREATININE 0.56  CALCIUM 9.2

## 2023-12-30 NOTE — Anesthesia Preprocedure Evaluation (Addendum)
 Anesthesia Evaluation    Airway        Dental   Pulmonary           Cardiovascular + Valvular Problems/Murmurs (Mild-Moderate TR)   TTE (2024) The left ventricular size is normal.  Left ventricular systolic function is normal.  LV ejection fraction = 60-65%.  Left ventricular filling pattern is normal.  The right ventricle is normal in size and function.  There is mild to moderate tricuspid regurgitation.  The tricuspid regurgitant jet is eccentrically directed.  Estimated right ventricular systolic pressure is 38 mmHg.  The inferior vena cava was not visualized during the exam.  There is no pericardial effusion.  Compared to the prior study, there is probably no significant change.     Neuro/Psych Seizures -,  Epilepsy - controlled on medications  Neuromuscular disease    GI/Hepatic   Endo/Other    Renal/GU Renal disease Bladder dysfunction      Musculoskeletal   Abdominal   Peds  Hematology  (+) Blood dyscrasia, anemia Hgb 10.4, Plts 113K (9/14)   Anesthesia Other Findings   Reproductive/Obstetrics                              Anesthesia Physical Anesthesia Plan  ASA: 3  Anesthesia Plan: General   Post-op Pain Management: Ofirmev  IV (intra-op)*   Induction:   PONV Risk Score and Plan: 2  Airway Management Planned: Oral ETT  Additional Equipment:   Intra-op Plan:   Post-operative Plan:   Informed Consent:   Plan Discussed with:   Anesthesia Plan Comments:          Anesthesia Quick Evaluation

## 2023-12-30 NOTE — ED Provider Notes (Signed)
  Physical Exam  BP 134/62 (BP Location: Left Arm)   Pulse 68   Temp 97.6 F (36.4 C) (Oral)   Resp 18   Ht 5' 6 (1.676 m)   Wt 69.9 kg   SpO2 99%   BMI 24.86 kg/m   Physical Exam  Procedures  Procedures  ED Course / MDM   Clinical Course as of 12/30/23 1511  Sun Dec 30, 2023  1426 Apparent distal femur fracture [CP]    Clinical Course User Index [CP] Rosan Sherlean DEL, PA-C   Medical Decision Making Amount and/or Complexity of Data Reviewed Labs: ordered. Radiology: ordered.  Risk OTC drugs. Prescription drug management. Decision regarding hospitalization.   H/o seizure DO, Dravet Syndrome Mech fall - distal femur fx\ CT of fx pending  Chronic conditions managed at [facility]  Will need surgery - has seen Vernetta in the past so he is paged for consultation. Anticipate admission to medicine given complicated medical history  Dr. Colman on call for [facility] who states, per Dr. Vernetta, patient can be referred to unassigned orthopedics. Dr. Sherida paged.  3:40 - Introduced myself to mom. Checking on patient who is resting and quiet. CT pending. Per Dr. Sherida, this patient will need to be transferred [facility] care.   At this point, mom is comfortable with admission in [place]. She has an adequate supply of medications. She does note the patient stopped Forteo back in March. Will discuss with neuro, Dr. Vanessa, regarding whether her specialized neurologic care can be managed at [facility].   Per Dr. Vanessa: Manuelita to admit to [facility]. IMPERATIVE that she does not miss ANY of her regular medications. Per mom, she has not had any missed doses. Mom has all medications with her and wants to be involved with ongoing pharmacologic care.  Dr. Sherida to provide initial orthopedic consultation. Hospitalist paged for admission/transfer to [facility].   Discussed with Dr. Jerri, [facility], who will facilitate transfer/admission to [facility].         Odell Balls, PA-C 12/30/23 1713    Francesca Elsie CROME, MD 12/31/23 1300

## 2023-12-31 ENCOUNTER — Inpatient Hospital Stay (HOSPITAL_COMMUNITY)

## 2023-12-31 ENCOUNTER — Encounter (HOSPITAL_COMMUNITY): Payer: Self-pay

## 2023-12-31 ENCOUNTER — Encounter (HOSPITAL_COMMUNITY)
Admission: EM | Disposition: A | Discharge status: Home Health | Payer: Self-pay | Source: Home / Self Care | Attending: Family Medicine

## 2023-12-31 DIAGNOSIS — S72352A Displaced comminuted fracture of shaft of left femur, initial encounter for closed fracture: Secondary | ICD-10-CM

## 2023-12-31 DIAGNOSIS — Z4682 Encounter for fitting and adjustment of non-vascular catheter: Secondary | ICD-10-CM | POA: Diagnosis not present

## 2023-12-31 LAB — IRON AND TIBC
Iron: 23 ug/dL — ABNORMAL LOW (ref 28–170)
Saturation Ratios: 5 % — ABNORMAL LOW (ref 10.4–31.8)
TIBC: 438 ug/dL (ref 250–450)
UIBC: 415 ug/dL

## 2023-12-31 LAB — CBC
HCT: 26.9 % — ABNORMAL LOW (ref 36.0–46.0)
Hemoglobin: 8.6 g/dL — ABNORMAL LOW (ref 12.0–15.0)
MCH: 29.7 pg (ref 26.0–34.0)
MCHC: 32 g/dL (ref 30.0–36.0)
MCV: 92.8 fL (ref 80.0–100.0)
Platelets: 116 K/uL — ABNORMAL LOW (ref 150–400)
RBC: 2.9 MIL/uL — ABNORMAL LOW (ref 3.87–5.11)
RDW: 14 % (ref 11.5–15.5)
WBC: 11.9 K/uL — ABNORMAL HIGH (ref 4.0–10.5)
nRBC: 0 % (ref 0.0–0.2)

## 2023-12-31 LAB — COMPREHENSIVE METABOLIC PANEL WITH GFR
ALT: 28 U/L (ref 0–44)
AST: 26 U/L (ref 15–41)
Albumin: 2.8 g/dL — ABNORMAL LOW (ref 3.5–5.0)
Alkaline Phosphatase: 60 U/L (ref 38–126)
Anion gap: 10 (ref 5–15)
BUN: 18 mg/dL (ref 6–20)
CO2: 24 mmol/L (ref 22–32)
Calcium: 8.4 mg/dL — ABNORMAL LOW (ref 8.9–10.3)
Chloride: 100 mmol/L (ref 98–111)
Creatinine, Ser: 0.66 mg/dL (ref 0.44–1.00)
GFR, Estimated: >60 mL/min (ref >60)
Glucose, Bld: 163 mg/dL — ABNORMAL HIGH (ref 70–99)
Potassium: 3.5 mmol/L (ref 3.5–5.1)
Sodium: 134 mmol/L — ABNORMAL LOW (ref 135–145)
Total Bilirubin: 0.3 mg/dL (ref 0.0–1.2)
Total Protein: 6.5 g/dL (ref 6.5–8.1)

## 2023-12-31 LAB — HIV ANTIBODY (ROUTINE TESTING W REFLEX): HIV Screen 4th Generation wRfx: NONREACTIVE

## 2023-12-31 LAB — PROTIME-INR
INR: 1.1 (ref 0.8–1.2)
Prothrombin Time: 14.4 s (ref 11.4–15.2)

## 2023-12-31 LAB — ABO/RH: ABO/RH(D): O POS

## 2023-12-31 LAB — FERRITIN: Ferritin: 10 ng/mL — ABNORMAL LOW (ref 11–307)

## 2023-12-31 SURGERY — OPEN REDUCTION INTERNAL FIXATION (ORIF) DISTAL FEMUR FRACTURE
Anesthesia: General | Laterality: Left

## 2023-12-31 MED ORDER — POLYETHYLENE GLYCOL 3350 17 G PO PACK
17.0000 g | PACK | Freq: Every day | ORAL | Status: DC
  Administered 2023-12-31 – 2024-01-02 (×3): 17 g via ORAL
  Filled 2023-12-31 (×4): qty 1

## 2023-12-31 MED ORDER — SODIUM CHLORIDE 0.9 % IV SOLN
1.0000 g | INTRAVENOUS | Status: DC
  Administered 2023-12-31 – 2024-01-01 (×2): 1 g via INTRAVENOUS
  Filled 2023-12-31 (×2): qty 10

## 2023-12-31 MED ORDER — METHENAMINE HIPPURATE 1 G PO TABS
1.0000 g | ORAL_TABLET | Freq: Two times a day (BID) | ORAL | Status: DC
  Administered 2023-12-31 – 2024-01-07 (×15): 1 g via ORAL
  Filled 2023-12-31 (×16): qty 1

## 2023-12-31 MED ORDER — LORAZEPAM 4 MG/ML IJ SOLN: 2.0000 mg | INTRAMUSCULAR | Status: DC | PRN

## 2023-12-31 MED ORDER — SODIUM CHLORIDE 0.9% FLUSH: 10.0000 mL | INTRAVENOUS | Status: DC | PRN

## 2023-12-31 MED ORDER — ORAL CARE MOUTH RINSE: 15.0000 mL | OROMUCOSAL | Status: DC | PRN

## 2023-12-31 MED ORDER — CHLORHEXIDINE GLUCONATE CLOTH 2 % EX PADS
6.0000 | MEDICATED_PAD | Freq: Every day | CUTANEOUS | Status: DC
  Administered 2023-12-31 – 2024-01-07 (×6): 6 via TOPICAL

## 2023-12-31 MED ORDER — SENNOSIDES-DOCUSATE SODIUM 8.6-50 MG PO TABS
1.0000 | ORAL_TABLET | Freq: Two times a day (BID) | ORAL | Status: DC
  Administered 2023-12-31 – 2024-01-03 (×6): 1 via ORAL
  Filled 2023-12-31 (×7): qty 1

## 2023-12-31 MED ORDER — DEXTROSE IN LACTATED RINGERS 5 % IV SOLN
INTRAVENOUS | Status: DC
  Filled 2023-12-31: qty 1000

## 2023-12-31 NOTE — Consult Note (Signed)
 Reason for Consult:Left distal femur fx Referring Physician: Arvella Mon Time called: 9155 Time at bedside: 0905   Molly Duffy is an 53 y.o. female.  HPI: Latera was leaving a restaurant with her parents. As she was going down a slope she lost her balance and fell then her dad fell on top of her. She had left leg pain and was brought to the ED where x-rays showed a left distal femur fx and orthopedic surgery was consulted. Pt has a severe developmental delay and is nonverbal so cannot contribute to history. She is mainly nonambulatory but will walk short distances with parental help. She lives at home with her parents.  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  Social History:  reports that she has never smoked. She has never used smokeless tobacco. She reports that she does not drink alcohol . No history on file for drug use.  Allergies:  Allergies  Allergen Reactions   Levetiracetam Other (See Comments)    Makes seizures worse Seizures worse.   Vigabatrin Other (See Comments)    Nonconvulsive status.    Levetiracetam     Makes seizures worse   Phenobarbital Nausea And Vomiting    Makes seizures worse. Adverse effects   Phenytoin Sodium Extended    Tiagabine Hcl     Makes seizures worse   Tiagabine Hcl Other (See Comments)    Makes seizures worse    Medications: I have reviewed the patient's current medications.  Results for orders placed or performed during the hospital encounter of 12/30/23 (from the past 48 hours)  CBC     Status: Abnormal   Collection Time: 12/30/23  3:25 PM  Result Value Ref Range   WBC 11.6 (H) 4.0 - 10.5 K/uL   RBC 3.60 (L) 3.87 - 5.11 MIL/uL   Hemoglobin 10.4 (L) 12.0 - 15.0 g/dL   HCT  66.5 (L) 63.9 - 46.0 %   MCV 92.8 80.0 - 100.0 fL   MCH 28.9 26.0 - 34.0 pg   MCHC 31.1 30.0 - 36.0 g/dL   RDW 86.1 88.4 - 84.4 %   Platelets 113 (L) 150 - 400 K/uL   nRBC 0.0 0.0 - 0.2 %    Comment: Performed at [facility], [address], [place], [place] [address]  Basic metabolic panel     Status: Abnormal   Collection Time: 12/30/23  3:25 PM  Result Value Ref Range   Sodium 136 135 - 145 mmol/L   Potassium 3.7 3.5 - 5.1 mmol/L   Chloride 101 98 - 111 mmol/L   CO2 24 22 - 32 mmol/L   Glucose, Bld 150 (H) 70 - 99 mg/dL    Comment: Glucose reference range applies only to samples taken after fasting for at least 8 hours.   BUN 17 6 - 20 mg/dL   Creatinine, Ser 9.43 0.44 - 1.00 mg/dL   Calcium 9.2 8.9 - 89.6 mg/dL   GFR, Estimated >39 >39 mL/min    Comment: (NOTE) Calculated using the CKD-EPI Creatinine Equation (2021)    Anion gap 11 5 - 15    Comment: Performed at [facility], [address] [place], [place] [address]  Urinalysis, w/ Reflex to Culture (Infection Suspected) -Urine, Clean Catch     Status: Abnormal   Collection Time: 12/30/23  5:16 PM  Result Value Ref Range   Specimen Source URINE, CLEAN CATCH    Color, Urine YELLOW YELLOW   APPearance HAZY (A) CLEAR   Specific Gravity, Urine 1.019 1.005 - 1.030   pH 6.0 5.0 - 8.0   Glucose, UA NEGATIVE NEGATIVE mg/dL   Hgb urine dipstick NEGATIVE NEGATIVE   Bilirubin Urine NEGATIVE NEGATIVE   Ketones, ur NEGATIVE NEGATIVE mg/dL   Protein, ur NEGATIVE NEGATIVE mg/dL   Nitrite NEGATIVE NEGATIVE   Leukocytes,Ua LARGE (A) NEGATIVE   RBC / HPF 6-10 0 - 5 RBC/hpf   WBC, UA >50 0 - 5 WBC/hpf    Comment:        Reflex urine culture not performed if WBC <=10, OR if Squamous epithelial cells >5. If Squamous epithelial cells >5 suggest recollection.    Bacteria, UA MANY (A) NONE SEEN   Squamous Epithelial / HPF 0-5 0 - 5 /HPF    Comment: Performed at [facility],  [address], [place], [place] [address]  ABO/Rh     Status: None   Collection Time: 12/31/23  5:15 AM  Result Value Ref Range   ABO/RH(D)      O POS Performed at [facility], [address] [place], [place] [address]   Iron and TIBC     Status: Abnormal   Collection Time: 12/31/23  5:17 AM  Result Value Ref Range   Iron 23 (L) 28 - 170 ug/dL   TIBC 561 749 - 549 ug/dL   Saturation Ratios 5 (L) 10.4 - 31.8 %   UIBC 415 ug/dL    Comment: Performed at [facility], [address] [place], [place] [address]  Ferritin     Status: Abnormal   Collection Time: 12/31/23  5:17 AM  Result Value Ref Range   Ferritin 10 (L) 11 - 307 ng/mL    Comment: Performed at [facility], [address], [place], [place] [address]  CBC     Status: Abnormal   Collection Time: 12/31/23  5:17 AM  Result Value Ref Range   WBC 11.9 (H) 4.0 - 10.5 K/uL   RBC 2.90 (L) 3.87 - 5.11 MIL/uL   Hemoglobin 8.6 (L) 12.0 - 15.0 g/dL   HCT 73.0 (L) 63.9 - 53.9 %   MCV 92.8 80.0 - 100.0 fL   MCH 29.7 26.0 - 34.0 pg   MCHC 32.0 30.0 - 36.0 g/dL   RDW 85.9 88.4 - 84.4 %   Platelets 116 (L) 150 - 400 K/uL   nRBC 0.0 0.0 - 0.2 %    Comment: Performed at [facility], [address], [place], [place] [address]  Comprehensive metabolic panel     Status: Abnormal   Collection Time: 12/31/23  5:17 AM  Result Value Ref Range   Sodium 134 (L) 135 - 145 mmol/L   Potassium 3.5 3.5 - 5.1 mmol/L   Chloride 100 98 - 111 mmol/L   CO2 24 22 - 32 mmol/L   Glucose, Bld 163 (H) 70 - 99 mg/dL    Comment: Glucose reference range applies only to samples taken after fasting for at least 8 hours.   BUN 18 6 - 20 mg/dL   Creatinine, Ser 9.33 0.44 - 1.00 mg/dL   Calcium 8.4 (L) 8.9 -  10.3 mg/dL   Total Protein 6.5 6.5 - 8.1 g/dL   Albumin 2.8 (L) 3.5 - 5.0 g/dL   AST 26 15 - 41 U/L   ALT 28 0 - 44 U/L   Alkaline Phosphatase 60 38 - 126 U/L   Total Bilirubin 0.3 0.0 - 1.2 mg/dL   GFR, Estimated >39 >39  mL/min    Comment: (NOTE) Calculated using the CKD-EPI Creatinine Equation (2021)    Anion gap 10 5 - 15    Comment: Performed at [facility], [address], [place], [place] [address]  Protime-INR     Status: None   Collection Time: 12/31/23  5:17 AM  Result Value Ref Range   Prothrombin Time 14.4 11.4 - 15.2 seconds   INR 1.1 0.8 - 1.2    Comment: (NOTE) INR goal varies based on device and disease states. Performed at [facility], [address], [place], [place] [address]     DG CHEST PORT 1 VIEW Result Date: 12/31/2023 EXAM: 1 VIEW XRAY OF THE CHEST 12/31/2023 06:14:00 AM COMPARISON: None available. CLINICAL HISTORY: Port-A-Cath in place [phone]. Port a cath in place. FINDINGS: LINES, TUBES AND DEVICES: Right-sided Port-A-Cath in place with tip over the right atrium. Left upper chest generator device in place with leads extending superiorly to the left neck. LUNGS AND PLEURA: No focal pulmonary opacity. No pulmonary edema. No pleural effusion. No pneumothorax. HEART AND MEDIASTINUM: No acute abnormality of the cardiac and mediastinal silhouettes. BONES AND SOFT TISSUES: No acute osseous abnormality. IMPRESSION: 1. No acute process. 2. Right-sided Port-A-Cath in place with tip over the right atrium. 3. Left upper chest generator device in place with leads extending superiorly to the left neck. Electronically signed by: Evalene Coho MD 12/31/2023 06:40 AM EDT RP Workstation: [id number]   CT FEMUR LEFT WO CONTRAST Result Date: 12/30/2023 CLINICAL DATA:  Fracture planning. EXAM: CT OF THE LOWER LEFT EXTREMITY WITHOUT CONTRAST TECHNIQUE: Multidetector CT imaging of the lower left extremity was performed according to the standard protocol. RADIATION DOSE REDUCTION: This exam was performed according to the departmental dose-optimization program which includes automated exposure control, adjustment of the mA and/or kV according to patient size and/or use of iterative  reconstruction technique. COMPARISON:  Left hip x-ray same day FINDINGS: Bones/Joint/Cartilage The bones are osteopenic. Oblique/vertically oriented fracture through the distal femoral diaphysis is seen. The most cranial portion is 17 cm proximal to the knee joint. There is transverse comminuted fracture of the metaphyseal region extending to involve the superior aspect of the lateral femoral condyle. This portion of the fracture appears impacted with fracture fragments distracted up to 15 mm. There is no dislocation. The articulating surfaces of the femoral condyles are not involved in the fracture. Small joint effusion present. Ligaments Suboptimally assessed by CT. Muscles and Tendons There is intramuscular edema and hemorrhage surrounding the fractures. There is deep fascial plane hyperdense fluid posterior to the fracture compatible with hemorrhage. Soft tissues No focal hematoma or fluid collection. IMPRESSION: 1. Comminuted fracture of the distal femoral diaphysis and metaphysis. The most cranial portion of the fracture is 17 cm proximal to the knee joint. There is no dislocation. 2. Small joint effusion. 3. Intramuscular and deep fascial plane hemorrhage surrounding the fractures. Electronically Signed   By: Greig Pique M.D.   On: 12/30/2023 16:57   DG Knee Complete 4 Views Left Result Date: 12/30/2023 CLINICAL DATA:  Fall, knee pain EXAM: LEFT KNEE - COMPLETE 4+ VIEW COMPARISON:  None  Available. FINDINGS: Comminuted fracture of the mid to distal femur includes a longitudinal fracture extending obliquely from the mid to distal shaft to the lateral epicondyle, extending into a transverse distal femoral metaphyseal fracture. I cannot exclude extension to weight-bearing articular surface in the knee, with lateral condylar involvement a possibility. Consider CT of the femur for definitive characterization. IMPRESSION: 1. Comminuted fracture of the mid to distal femur, with a longitudinal fracture extending  obliquely from the mid to distal shaft to the lateral epicondyle, extending into a transverse distal femoral metaphyseal fracture. I cannot exclude extension to the weight-bearing articular surface in the knee, with lateral condylar involvement a possibility. Consider CT of the left femur. Electronically Signed   By: Ryan Salvage M.D.   On: 12/30/2023 14:57   DG Hip Unilat With Pelvis 2-3 Views Left Result Date: 12/30/2023 CLINICAL DATA:  Fall, lower extremity pain with movement EXAM: DG HIP (WITH OR WITHOUT PELVIS) 2-3V LEFT COMPARISON:  06/11/2019 FINDINGS: The proximal most margin of a mid femoral fracture is visible on the frontal projection, extending distally. No fracture of the femoral neck/hip region is identified. IMPRESSION: 1. The proximal most margin of a mid femoral fracture is visible on the frontal projection, extending distally. Electronically Signed   By: Ryan Salvage M.D.   On: 12/30/2023 14:54    Review of Systems  Unable to perform ROS: Patient nonverbal   Blood pressure 138/75, pulse 100, temperature 99.7 F (37.6 C), resp. rate 18, height 5' 6 (1.676 m), weight 69.9 kg, SpO2 99%. Physical Exam Constitutional:      General: She is not in acute distress.    Appearance: She is well-developed. She is not diaphoretic.  HENT:     Head: Atraumatic.  Eyes:     General: No scleral icterus.       Right eye: No discharge.        Left eye: No discharge.     Conjunctiva/sclera: Conjunctivae normal.  Cardiovascular:     Rate and Rhythm: Normal rate and regular rhythm.  Pulmonary:     Effort: Pulmonary effort is normal. No respiratory distress.  Musculoskeletal:     Cervical back: Normal range of motion.     Comments: LLE No traumatic wounds, ecchymosis, or rash  KI in place  No ankle effusion  Sens DPN, SPN, TN could not assess  Motor EHL, ext, flex, evers could not assess  DP 1+, PT 0, No significant edema  Skin:    General: Skin is warm and dry.   Neurological:     Mental Status: She is alert.  Psychiatric:     Comments: Nonverbal     Assessment/Plan: Left distal femur fx -- Although orthopedic recommendation is for surgical fixation family is reluctant. They have managed several fractures in the past without surgery. They are comfortable using a lift and keeping her NWB. We discussed how her pain may be alleviated more quickly with surgical stabilization and that there was a possibility non-surgical treatment may lead to deformity and inability to walk again. Parents would like to see how everyday care goes today before they make their decision which I think is reasonable given her situation.     Ozell DOROTHA Ned, PA-C Orthopedic Surgery [phone] 12/31/2023, 9:34 AM

## 2023-12-31 NOTE — Plan of Care (Signed)

## 2023-12-31 NOTE — TOC CAGE-AID Note (Signed)
 Transition of Care [facility]) - CAGE-AID Screening   Patient Details  Name: Molly Duffy MRN: [id number] Date of Birth: 02-09-1971  Transition of Care [facility]) CM/SW Contact:    Tami Blass E Ardyth Kelso, LCSW Phone Number: 12/31/2023, 10:18 AM   Clinical Narrative: No SA noted.   CAGE-AID Screening: Substance Abuse Screening unable to be completed due to: : Patient unable to participate

## 2023-12-31 NOTE — Progress Notes (Signed)
 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 Mardella saw in consult Plan for left femur open reduction internal fixation  Labs Sodium 136 potassium 3.7 BUN/creatinine 17/0.5 WBC 11.6 hemoglobin 10 platelet 113 UA many bacteria large leukocytes negative nitrite urine culture performed pending Imaging CT femur comminuted distal femoral diaphysis metaphases cranial part of fracture 17 cm proximal to knee joint no dislocation-intramuscular deep fascial plane hemorrhage around fracture CXR right-sided port in place with tip over right atrium left upper chest generator place with leads extending to left neck    Assessment  & Plan :    Comminuted left femoral fracture Await surgery, knee immobilizer-pain control Tylenol  650 every 6 as needed Norco 1 every 6 as needed moderate morphine  1 Q2 as needed Therapy to see to mobilize to bedside commode if possible if too much pain would wait until postsurgery D5 LR 40 cc/H until surgery-is eating now Leukocytosis frequent history of UTIs Nonobstructive urinary stones in the past Started ceftriaxone  1 g every 24, continue methenamine  1 g  twice daily Follow culture as has frequent UTIs followed by urology gynecology as an outpatient Normocytic anemia Family prefers to wait on iron replacement-prefer p.o. 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 Care order placed to ensure that all antiepileptics given perioperatively given risk of seizure Continues Epidiolex  600 twice daily, Depakote  125 4 times daily fenfluramine  3 mL 3 times daily Zonegran  175 at bedtime Mother has brought all meds and they should be given   Data Reviewed:   Sodium 134 potassium 3.5 BUN/creatinine 18/0.6 LFTs normal WBC 11.9 hemoglobin down from 10-8.6 INR 1.1 Iron level 23 saturation is 10 ferritin 10  DVT prophylaxis: SCD  Status is: Inpatient Remains inpatient appropriate because:   Needs surgery     Current Dispo: Inpatient     Subjective:    Recognizes family in the room Seems to be in pain from abdominal discomfort and constipation, probably from the opiates Had breakfast this morning according to mother who is at bedside Does not seem to be in severe distress Is verbal but has some paucity of speech   Objective + exam Vitals:   12/30/23 2045 12/31/23 0135 12/31/23 0418 12/31/23 0420  BP: 130/74 (!) 151/74 (!) 149/109 138/75  Pulse: 82 88 85   Resp: 18  17   Temp: 98 F (36.7 C)  97.9 F (36.6 C)   TempSrc: Oral  Oral   SpO2: 100% 99% 100%   Weight:      Height:       Filed Weights   12/30/23 1314  Weight: 69.9 kg     Examination: EOMI NCAT dysmorphia S1-S2 slight tachycardia  Abdomen slightly distended no rebound Knee on left side in immobilizer Unable to toe point or dorsiflex on left side able to do so on right No lower extremity edema     Scheduled Meds:  cannabidiol   600 mg Oral BID   Chlorhexidine  Gluconate Cloth  6 each Topical Daily   divalproex   125 mg Oral 4 times per day   Fenfluramine  HCl  3 mL Oral TID    methenamine   1,000 mg Oral BID   zonisamide   100 mg Oral QHS   And   zonisamide   75 mg Oral QHS   Continuous Infusions:  dextrose  5% lactated ringers  75 mL/hr at 12/31/23 0415    Time 45  Colen Grimes, MD  [facility]

## 2023-12-31 NOTE — Progress Notes (Signed)
 Transition of Care [facility]) - Inpatient Brief Assessment   Patient Details  Name: Molly Duffy MRN: [id number] Date of Birth: 1970/12/12  Transition of Care [facility]) CM/SW Contact:    Rosaline JONELLE Joe, RN Phone Number: 12/31/2023, 4:26 PM   Clinical Narrative: Patient admitted to the hospital after a fall in the community - S/P Left distal femur fracture.  Patient was seen by orthopedics and mother/father are making a decision regarding possible surgery - no decision made by the family at this time per Orthopedic MD notes.  IP Care management will continue to follow the patient for IP Care management needs.   Transition of Care Asessment: Insurance and Status: (P) Insurance coverage has been reviewed Patient has primary care physician: (P) Yes Home environment has been reviewed: (P) from home with parents Prior level of function:: (P) family assistance   Social Drivers of Health Review: (P) SDOH reviewed needs interventions Readmission risk has been reviewed: (P) Yes Transition of care needs: (P) transition of care needs identified, TOC will continue to follow

## 2024-01-01 ENCOUNTER — Inpatient Hospital Stay (HOSPITAL_COMMUNITY)

## 2024-01-01 DIAGNOSIS — S72352A Displaced comminuted fracture of shaft of left femur, initial encounter for closed fracture: Secondary | ICD-10-CM | POA: Diagnosis not present

## 2024-01-01 LAB — URINE CULTURE: Culture: 50000 — AB

## 2024-01-01 MED ORDER — FLEET ENEMA RE ENEM
1.0000 | ENEMA | Freq: Once | RECTAL | Status: AC
  Administered 2024-01-01: 1 via RECTAL
  Filled 2024-01-01: qty 1

## 2024-01-01 MED ORDER — BISACODYL 10 MG RE SUPP
10.0000 mg | Freq: Once | RECTAL | Status: AC
  Administered 2024-01-01: 10 mg via RECTAL
  Filled 2024-01-01: qty 1

## 2024-01-01 MED ORDER — ESTRADIOL 0.1 MG/GM VA CREA
0.1000 g | TOPICAL_CREAM | VAGINAL | Status: DC
Start: 2024-01-02 — End: 2024-01-07
  Administered 2024-01-07: 0.1 g via VAGINAL
  Filled 2024-01-01: qty 42.5

## 2024-01-01 NOTE — Evaluation (Signed)
 Physical Therapy Evaluation Patient Details Name: Molly Duffy MRN: [id number] DOB: June 26, 1970 Today's Date: 01/01/2024  History of Present Illness  SHINEKA AUBLE is a 53 y.o. female admitted 9/14 after fall at [employer].  Patient is not able to provide history due to developmental delay/mostly nonverbal and seizure disorder.  Pt with left distal femur fx that initially parents were going to choose non-operative managment however plan now is for surgery 9/17. 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 Pt limited today by pain in left LE and abdomen (due to being consitpated). Pt was able to come to EOB with incr assist but parents say she would put weight on left LE if attempted to stand. While PT in room, PA with trauma came in and family informed him that they do want pt to have the left LE surgery for a better outcome therefore will reassess on Thursday 9/18 after surgery and make further recommendations at that time.   Pt currently with functional limitations due to the deficits listed below (see PT Problem List). Pt will benefit from acute skilled PT to increase their independence and safety with mobility to allow discharge.           If plan is discharge home, recommend the following: A lot of help with walking and/or transfers;A lot of help with bathing/dressing/bathroom;Assistance with cooking/housework;Assist for transportation;Help with stairs or ramp for entrance   Can travel by private vehicle        Equipment Recommendations Other (comment);Hoyer lift (TBA will likely need hoyer lift)  Recommendations for Other Services       Functional Status Assessment Patient has had a recent decline in their functional status and demonstrates the ability to make significant improvements in function in a reasonable and predictable amount of time.     Precautions /  Restrictions Precautions Precautions: Fall Required Braces or Orthoses: Knee Immobilizer - Left Knee Immobilizer - Left: On at all times Restrictions Weight Bearing Restrictions Per Provider Order: Yes LLE Weight Bearing Per Provider Order: Non weight bearing      Mobility  Bed Mobility Overal bed mobility: Needs Assistance Bed Mobility: Supine to Sit, Sit to Supine     Supine to sit: Mod assist, +2 for physical assistance, HOB elevated, Used rails Sit to supine: Total assist, +2 for physical assistance   General bed mobility comments: Pt family wanted PT to get pt on 3N1 however pt could not scoot backwards onto 3n1 as she cant use her UEs to scoot (family states pt doesnt follow commands PTA).  Ended up sitting pt EOB for a bit and then lying her back down. Mom says if PT tried to stand pt she will put her left LE on floor because she doesnt understand commands.    Transfers                        Ambulation/Gait                  Stairs            Wheelchair Mobility     Tilt Bed    Modified Rankin (Stroke Patients Only)       Balance Overall balance assessment: Needs assistance Sitting-balance support: Bilateral upper extremity supported, Feet supported, No upper extremity supported Sitting balance-Leahy Scale: Fair Sitting balance - Comments: Pt can sit EOB without UE support  and CGA. Mom went to sit with pt and pt leaned onto her for comfort and pt sat about 8 min.                                     Pertinent Vitals/Pain Pain Assessment Pain Assessment: Faces Faces Pain Scale: Hurts even more Pain Location: stomach and left LE Pain Descriptors / Indicators: Aching, Discomfort, Grimacing, Guarding Pain Intervention(s): Limited activity within patient's tolerance, Monitored during session, Repositioned    Home Living Family/patient expects to be discharged to:: Private residence Living Arrangements: Parent Available  Help at Discharge: Family;Available 24 hours/day;Personal care attendant (Staff come in 6 days week various hours) Type of Home: House Home Access: Ramped entrance (portable ramp they can use)       Home Layout: Two level;Able to live on main level with bedroom/bathroom Home Equipment: Wheelchair - manual;Transport chair;BSC/3in1;Grab bars - tub/shower;Hand held shower head (hoyer lift was rented previously; drop support vest and 1 person HHA, swivel seat in bathtub she sits on)      Prior Function               Mobility Comments: used wheelchair, min assist to guard pt with ambulation ADLs Comments: Bathing total assist but dresses self, feeds herself     Extremity/Trunk Assessment   Upper Extremity Assessment Upper Extremity Assessment: Defer to OT evaluation    Lower Extremity Assessment Lower Extremity Assessment: LLE deficits/detail;RLE deficits/detail RLE Deficits / Details: grossly 3/5 RLE Coordination: decreased gross motor;decreased fine motor LLE: Unable to fully assess due to immobilization;Unable to fully assess due to pain       Communication   Communication Communication: Impaired Factors Affecting Communication: Other (comment);Reduced clarity of speech (mostly non verbal)    Cognition Arousal: Alert Behavior During Therapy: Anxious   PT - Cognitive impairments: History of cognitive impairments, Problem solving, Safety/Judgement                         Following commands: Impaired Following commands impaired:  (pt doesnt follow one step commands per family due to developmental delay)     Cueing Cueing Techniques: Verbal cues, Gestural cues, Tactile cues     General Comments      Exercises Other Exercises Other Exercises: Deferred due to family wanted pt to sit EOB.   Assessment/Plan    PT Assessment Patient needs continued PT services  PT Problem List Decreased balance;Decreased mobility;Decreased knowledge of use of  DME;Decreased safety awareness;Decreased knowledge of precautions;Pain;Decreased range of motion;Decreased strength;Decreased activity tolerance;Decreased coordination       PT Treatment Interventions DME instruction;Functional mobility training;Therapeutic activities;Therapeutic exercise;Balance training;Patient/family education;Wheelchair mobility training    PT Goals (Current goals can be found in the Care Plan section)  Acute Rehab PT Goals Patient Stated Goal: to go home PT Goal Formulation: With family Time For Goal Achievement: 01/15/24 Potential to Achieve Goals: Good    Frequency Min 2X/week     Co-evaluation               AM-PAC PT 6 Clicks Mobility  Outcome Measure Help needed turning from your back to your side while in a flat bed without using bedrails?: Total Help needed moving from lying on your back to sitting on the side of a flat bed without using bedrails?: Total Help needed moving to and from a bed to a chair (including a wheelchair)?:  Total Help needed standing up from a chair using your arms (e.g., wheelchair or bedside chair)?: Total Help needed to walk in hospital room?: Total Help needed climbing 3-5 steps with a railing? : Total 6 Click Score: 6    End of Session   Activity Tolerance: Patient limited by pain Patient left: in bed;with call bell/phone within reach;with family/visitor present;with SCD's reapplied Nurse Communication: Mobility status;Need for lift equipment PT Visit Diagnosis: Muscle weakness (generalized) (M62.81)    Time: 8879-8851 PT Time Calculation (min) (ACUTE ONLY): 28 min   Charges:   PT Evaluation $PT Eval Moderate Complexity: 1 Mod PT Treatments $Therapeutic Activity: 8-22 mins PT General Charges $$ ACUTE PT VISIT: 1 Visit         Rafael Salway M,PT Acute Rehab Services [phone]   Stephane JULIANNA Bevel 01/01/2024, 12:52 PM

## 2024-01-01 NOTE — Plan of Care (Signed)
 Patient alert to self, mother at bedside. IV maintained and patnet. Patient's mother states patient is complaining of abdominal pain, patient's mother believes it is related to constipation. Patient and mother educated on side effects of opiods, new orders placed. Patient and mother left with call bell in reach and side rails up.  Problem: Health Behavior/Discharge Planning: Goal: Ability to manage health-related needs will improve Outcome: Progressing   Problem: Clinical Measurements: Goal: Ability to maintain clinical measurements within normal limits will improve Outcome: Progressing   Problem: Activity: Goal: Risk for activity intolerance will decrease Outcome: Progressing   Problem: Nutrition: Goal: Adequate nutrition will be maintained Outcome: Progressing   Problem: Coping: Goal: Level of anxiety will decrease Outcome: Progressing   Problem: Pain Managment: Goal: General experience of comfort will improve and/or be controlled Outcome: Progressing

## 2024-01-01 NOTE — Progress Notes (Signed)
 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 apparently follows with [facility] neurology Seems to be on Depakote  Epidiolex  and Fintepla   Zonegran  chronically-last office visit [name] 03/28/2023-last seizure March 2024 kidney stones not causing blockages Previous multiple ankle fractures ankle fracture and osteoporosis previously on bisphosphonate recurrent UTIs--followed by urology and gynecology  Was eating  fell onto a curb and hit left knee scratch left side face pain with movement vital stable-accidental fall Went to [facility] for eval found to have left hip fracture Neurology consulted because of chronic seizure issues, Dr. Mardella saw in consult Plan for left femur open reduction internal fixation  Labs Sodium 136 potassium 3.7 BUN/creatinine 17/0.5 WBC 11.6 hemoglobin 10 platelet 113 UA many bacteria large leukocytes negative nitrite urine culture performed pending Imaging CT femur comminuted distal femoral diaphysis metaphases cranial part of fracture 17 cm proximal to knee joint no dislocation-intramuscular deep fascial plane hemorrhage around fracture CXR right-sided port in place with tip over right atrium left upper chest generator place with leads extending to left neck    Assessment  & Plan :    Comminuted left femoral fracture Surgery likely 9/17 AM Pain control Tylenol  650 every 6 as needed Norco 1 tablet every 6 for pain 4/10, morphine  IV 1 mg Q2 as needed Leukocytosis frequent history of UTIs-cultures 50,000 E. coli Nonobstructive urinary stones in the past ceftriaxone  1 g every 24, continue methenamine  1 g twice daily Cannot give supplements in the hospital explained to family Normocytic anemia Family prefers to wait on iron  replacement-prefer p.o. Medication-induced thrombocytopenia Probably from Depakote -usual baseline is below 100-repeat labs a.m. Severe constipation Probably opioid-induced-did well with Dulcolax and had a small stool Proceeding to enema as x-ray 9/16 shows moderate stool burden Dravet syndrome on medication Care order placed to ensure that all antiepileptics given perioperatively given risk of seizure Continues Epidiolex  600 twice daily, Depakote  125 4 times daily fenfluramine  3 mL 3 times daily Zonegran  175 at bedtime Mother has brought all meds and they should be given in accordance with her schedule and I appreciate pharmacy looking in on that  Discussed extensively with mother Marval and father at bedside   Data Reviewed:    None  DVT prophylaxis: SCD  Status is: Inpatient Remains inpatient appropriate because:   Needs surgery     Current Dispo: Inpatient     Subjective:    In a lot less distress Smiling and happy   Objective + exam Vitals:   12/31/23 2047 01/01/24 0133 01/01/24 0614 01/01/24 1227  BP: (!) 146/65 134/74 (!) 151/80 (!) 145/77  Pulse: 99 (!) 107 (!) 110 (!) 110  Resp: 19 19 19    Temp: 97.7 F (36.5 C) 98 F (36.7 C) 98 F (36.7 C) 97.6 F (36.4 C)  TempSrc:    Oral  SpO2: 100% 98% 98% 98%  Weight:      Height:       Filed Weights   12/30/23 1314  Weight: 69.9 kg     Examination:  Left lower extremity knee immobilizer Chest is clear Dysmorphia Abdomen is soft no rebound No lower extremity edema S1-S2 no murmur   Scheduled Meds:  cannabidiol   600 mg Oral BID  Chlorhexidine  Gluconate Cloth  6 each Topical Daily   divalproex   125 mg Oral 4 times per day   [START ON 01/02/2024] estradiol   0.1 g Vaginal Once per day on Monday Wednesday Friday   Fenfluramine  HCl  3 mL Oral TID   methenamine   1 g Oral BID   polyethylene glycol  17 g Oral Daily   senna-docusate  1 tablet Oral BID   zonisamide   100 mg Oral QHS   And   zonisamide    75 mg Oral QHS   Continuous Infusions:  cefTRIAXone  (ROCEPHIN )  IV 1 g (01/01/24 1259)    Time 40  Colen Grimes, MD  [facility]

## 2024-01-01 NOTE — Progress Notes (Signed)
    Durable Medical Equipment  (From admission, onward)           Start     Ordered   01/01/24 1504  For home use only DME Other see comment  Once       Comments: Patient needs hoyer lift and hoyer pad provided.  Patient is S/P Left distal femur fracture repair and will need hoyer lift for safe transfers from bed to wheelchair post-surgery at the home.  Question:  Length of Need  Answer:  12 Months   01/01/24 1505

## 2024-01-01 NOTE — TOC Progression Note (Signed)
 Transition of Care [facility]) - Progression Note    Patient Details  Name: Molly Duffy MRN: [id number] Date of Birth: 23-Nov-1970  Transition of Care [facility]) CM/SW Contact  Rosaline JONELLE Joe, RN Phone Number: 01/01/2024, 2:58 PM  Clinical Narrative:    CM met with the patient and parents at the bedside to discuss IP Care needs to likely return home later this week post-surgery.  Patient has planned Left distal femur fracture repair scheduled in the OR tomorrow.  The patient's mom/dad state that they hope to have patient return home with home health once patient is stable post-surgery.  Parents state that patient will need a hoyer lift ordered and delivered to the home.   Parents prefer [employer] company - DMe order placed and [employer] called to schedule delivery to the home in the next couple of days.  Patient's parents were offered Medicare choice regarding home health agency and they did not have a preference.  I called Baker, RNCM with [facility] and she accepted the patient for [facility] RN, PT, OT.  HH orders to be placed and co-signed by MD.  CM will continue to follow the patient.  Patient will likely need PTAR home post -surgery since patient's parents do not have a WC van for safe transport to home at this time.                     Expected Discharge Plan and Services                                               Social Drivers of Health (SDOH) Interventions SDOH Screenings   Food Insecurity: No Food Insecurity (12/31/2023)  Housing: Unknown (12/31/2023)  Transportation Needs: Unknown (12/31/2023)  Utilities: Not At Risk (12/31/2023)  Financial Resource Strain: Low Risk  (12/09/2021)  Tobacco Use: Low Risk  (10/15/2023)    Readmission Risk Interventions    01/01/2024    2:58 PM 12/31/2023    4:25 PM  Readmission Risk Prevention Plan  Post Dischage Appt Complete Complete  Medication Screening Complete Complete  Transportation Screening Complete Complete

## 2024-01-02 ENCOUNTER — Inpatient Hospital Stay (HOSPITAL_COMMUNITY)

## 2024-01-02 ENCOUNTER — Inpatient Hospital Stay (HOSPITAL_COMMUNITY): Payer: Self-pay | Admitting: Certified Registered Nurse Anesthetist

## 2024-01-02 ENCOUNTER — Encounter (HOSPITAL_COMMUNITY)
Admission: EM | Disposition: A | Discharge status: Home Health | Payer: Self-pay | Source: Home / Self Care | Attending: Family Medicine

## 2024-01-02 ENCOUNTER — Other Ambulatory Visit: Payer: Self-pay

## 2024-01-02 ENCOUNTER — Encounter (HOSPITAL_COMMUNITY): Payer: Self-pay | Admitting: Internal Medicine

## 2024-01-02 DIAGNOSIS — R569 Unspecified convulsions: Secondary | ICD-10-CM | POA: Diagnosis not present

## 2024-01-02 DIAGNOSIS — S72452A Displaced supracondylar fracture without intracondylar extension of lower end of left femur, initial encounter for closed fracture: Secondary | ICD-10-CM

## 2024-01-02 DIAGNOSIS — S72352A Displaced comminuted fracture of shaft of left femur, initial encounter for closed fracture: Secondary | ICD-10-CM | POA: Diagnosis not present

## 2024-01-02 HISTORY — PX: ORIF FEMUR FRACTURE: SHX2119

## 2024-01-02 LAB — CBC WITH DIFFERENTIAL/PLATELET
Abs Immature Granulocytes: 0.07 K/uL (ref 0.00–0.07)
Basophils Absolute: 0 K/uL (ref 0.0–0.1)
Basophils Relative: 0 %
Eosinophils Absolute: 0 K/uL (ref 0.0–0.5)
Eosinophils Relative: 0 %
HCT: 19.9 % — ABNORMAL LOW (ref 36.0–46.0)
Hemoglobin: 6.3 g/dL — CL (ref 12.0–15.0)
Immature Granulocytes: 1 %
Lymphocytes Relative: 38 %
Lymphs Abs: 4.9 K/uL — ABNORMAL HIGH (ref 0.7–4.0)
MCH: 29.6 pg (ref 26.0–34.0)
MCHC: 31.7 g/dL (ref 30.0–36.0)
MCV: 93.4 fL (ref 80.0–100.0)
Monocytes Absolute: 1.7 K/uL — ABNORMAL HIGH (ref 0.1–1.0)
Monocytes Relative: 13 %
Neutro Abs: 6 K/uL (ref 1.7–7.7)
Neutrophils Relative %: 48 %
Platelets: 100 K/uL — ABNORMAL LOW (ref 150–400)
RBC: 2.13 MIL/uL — ABNORMAL LOW (ref 3.87–5.11)
RDW: 13.9 % (ref 11.5–15.5)
WBC: 12.7 K/uL — ABNORMAL HIGH (ref 4.0–10.5)
nRBC: 0 % (ref 0.0–0.2)

## 2024-01-02 LAB — BASIC METABOLIC PANEL WITH GFR
Anion gap: 8 (ref 5–15)
BUN: 8 mg/dL (ref 6–20)
CO2: 27 mmol/L (ref 22–32)
Calcium: 8.2 mg/dL — ABNORMAL LOW (ref 8.9–10.3)
Chloride: 102 mmol/L (ref 98–111)
Creatinine, Ser: 0.57 mg/dL (ref 0.44–1.00)
GFR, Estimated: >60 mL/min (ref >60)
Glucose, Bld: 117 mg/dL — ABNORMAL HIGH (ref 70–99)
Potassium: 3.4 mmol/L — ABNORMAL LOW (ref 3.5–5.1)
Sodium: 137 mmol/L (ref 135–145)

## 2024-01-02 LAB — PREPARE RBC (CROSSMATCH)

## 2024-01-02 LAB — VITAMIN D 25 HYDROXY (VIT D DEFICIENCY, FRACTURES): Vit D, 25-Hydroxy: 85.41 ng/mL (ref 30–100)

## 2024-01-02 SURGERY — OPEN REDUCTION INTERNAL FIXATION (ORIF) DISTAL FEMUR FRACTURE
Anesthesia: General | Laterality: Left

## 2024-01-02 MED ORDER — ROCURONIUM BROMIDE 10 MG/ML (PF) SYRINGE
PREFILLED_SYRINGE | INTRAVENOUS | Status: AC
Start: 2024-01-02 — End: 2024-01-02
  Filled 2024-01-02: qty 10

## 2024-01-02 MED ORDER — SUGAMMADEX SODIUM 200 MG/2ML IV SOLN
INTRAVENOUS | Status: DC | PRN
  Administered 2024-01-02: 200 mg via INTRAVENOUS

## 2024-01-02 MED ORDER — DIPHENHYDRAMINE HCL 12.5 MG/5ML PO ELIX: 12.5000 mg | ORAL_SOLUTION | ORAL | Status: DC | PRN

## 2024-01-02 MED ORDER — OXYCODONE HCL 5 MG PO TABS: 5.0000 mg | ORAL_TABLET | Freq: Once | ORAL | Status: DC | PRN

## 2024-01-02 MED ORDER — BACITRACIN ZINC 500 UNIT/GM EX OINT
TOPICAL_OINTMENT | CUTANEOUS | Status: AC
Start: 2024-01-02 — End: 2024-01-02
  Filled 2024-01-02: qty 28.35

## 2024-01-02 MED ORDER — 0.9 % SODIUM CHLORIDE (POUR BTL) OPTIME
TOPICAL | Status: DC | PRN
  Administered 2024-01-02: 1000 mL

## 2024-01-02 MED ORDER — CEFAZOLIN SODIUM-DEXTROSE 2-4 GM/100ML-% IV SOLN
2.0000 g | INTRAVENOUS | Status: AC
  Administered 2024-01-02: 2 g via INTRAVENOUS
  Filled 2024-01-02: qty 100

## 2024-01-02 MED ORDER — ONDANSETRON HCL 4 MG PO TABS: 4.0000 mg | ORAL_TABLET | Freq: Four times a day (QID) | ORAL | Status: DC | PRN

## 2024-01-02 MED ORDER — CHLORHEXIDINE GLUCONATE 0.12 % MT SOLN: 15.0000 mL | Freq: Once | OROMUCOSAL | Status: DC

## 2024-01-02 MED ORDER — OXYCODONE HCL 5 MG/5ML PO SOLN: 5.0000 mg | Freq: Once | ORAL | Status: DC | PRN

## 2024-01-02 MED ORDER — POLYETHYLENE GLYCOL 3350 17 G PO PACK: 17.0000 g | PACK | Freq: Every day | ORAL | Status: DC | PRN

## 2024-01-02 MED ORDER — VANCOMYCIN HCL 1000 MG IV SOLR
INTRAVENOUS | Status: DC | PRN
  Administered 2024-01-02: 1000 mg

## 2024-01-02 MED ORDER — MORPHINE SULFATE (PF) 2 MG/ML IV SOLN: 1.0000 mg | INTRAVENOUS | Status: DC | PRN

## 2024-01-02 MED ORDER — FENTANYL CITRATE (PF) 250 MCG/5ML IJ SOLN
INTRAMUSCULAR | Status: DC | PRN
  Administered 2024-01-02 (×4): 50 ug via INTRAVENOUS

## 2024-01-02 MED ORDER — HYDROCODONE-ACETAMINOPHEN 5-325 MG PO TABS
1.0000 | ORAL_TABLET | ORAL | Status: DC | PRN
  Administered 2024-01-02 – 2024-01-07 (×21): 1 via ORAL
  Filled 2024-01-02 (×11): qty 1
  Filled 2024-01-02: qty 2
  Filled 2024-01-02 (×10): qty 1

## 2024-01-02 MED ORDER — METOCLOPRAMIDE HCL 5 MG PO TABS: 5.0000 mg | ORAL_TABLET | Freq: Three times a day (TID) | ORAL | Status: DC | PRN

## 2024-01-02 MED ORDER — SODIUM CHLORIDE 0.9% IV SOLUTION: Freq: Once | INTRAVENOUS | Status: AC

## 2024-01-02 MED ORDER — LACTATED RINGERS IV SOLN
INTRAVENOUS | Status: DC
Start: 2024-01-02 — End: 2024-01-02

## 2024-01-02 MED ORDER — METOCLOPRAMIDE HCL 5 MG/ML IJ SOLN: 5.0000 mg | Freq: Three times a day (TID) | INTRAMUSCULAR | Status: DC | PRN

## 2024-01-02 MED ORDER — POTASSIUM CHLORIDE 10 MEQ/100ML IV SOLN: 10.0000 meq | INTRAVENOUS | Status: DC

## 2024-01-02 MED ORDER — MIDAZOLAM HCL 2 MG/2ML IJ SOLN
INTRAMUSCULAR | Status: DC | PRN
  Administered 2024-01-02: 2 mg via INTRAVENOUS

## 2024-01-02 MED ORDER — FENTANYL CITRATE (PF) 100 MCG/2ML IJ SOLN
25.0000 ug | INTRAMUSCULAR | Status: DC | PRN
  Administered 2024-01-02: 50 ug via INTRAVENOUS

## 2024-01-02 MED ORDER — FENTANYL CITRATE (PF) 250 MCG/5ML IJ SOLN
INTRAMUSCULAR | Status: AC
  Filled 2024-01-02: qty 5

## 2024-01-02 MED ORDER — DOCUSATE SODIUM 100 MG PO CAPS
100.0000 mg | ORAL_CAPSULE | Freq: Two times a day (BID) | ORAL | Status: DC
  Administered 2024-01-02 – 2024-01-03 (×3): 100 mg via ORAL
  Filled 2024-01-02 (×7): qty 1

## 2024-01-02 MED ORDER — PROPOFOL 10 MG/ML IV BOLUS
INTRAVENOUS | Status: AC
  Filled 2024-01-02: qty 20

## 2024-01-02 MED ORDER — AMOXICILLIN-POT CLAVULANATE 875-125 MG PO TABS
1.0000 | ORAL_TABLET | Freq: Two times a day (BID) | ORAL | Status: AC
  Administered 2024-01-02 – 2024-01-04 (×6): 1 via ORAL
  Filled 2024-01-02 (×6): qty 1

## 2024-01-02 MED ORDER — DROPERIDOL 2.5 MG/ML IJ SOLN: 0.6250 mg | Freq: Once | INTRAMUSCULAR | Status: DC | PRN

## 2024-01-02 MED ORDER — ONDANSETRON HCL 4 MG/2ML IJ SOLN: 4.0000 mg | Freq: Four times a day (QID) | INTRAMUSCULAR | Status: DC | PRN

## 2024-01-02 MED ORDER — SODIUM CHLORIDE 0.9 % IV SOLN: INTRAVENOUS | Status: AC

## 2024-01-02 MED ORDER — ONDANSETRON HCL 4 MG/2ML IJ SOLN
INTRAMUSCULAR | Status: DC | PRN
  Administered 2024-01-02: 4 mg via INTRAVENOUS

## 2024-01-02 MED ORDER — PROPOFOL 10 MG/ML IV BOLUS
INTRAVENOUS | Status: DC | PRN
  Administered 2024-01-02: 50 mg via INTRAVENOUS

## 2024-01-02 MED ORDER — POTASSIUM CHLORIDE 10 MEQ/100ML IV SOLN
10.0000 meq | INTRAVENOUS | Status: AC
Start: 2024-01-02 — End: 2024-01-02

## 2024-01-02 MED ORDER — ORAL CARE MOUTH RINSE: 15.0000 mL | Freq: Once | OROMUCOSAL | Status: DC

## 2024-01-02 MED ORDER — ASPIRIN 325 MG PO TABS
325.0000 mg | ORAL_TABLET | Freq: Every day | ORAL | Status: DC
  Administered 2024-01-03 – 2024-01-07 (×5): 325 mg via ORAL
  Filled 2024-01-02 (×5): qty 1

## 2024-01-02 MED ORDER — ONDANSETRON HCL 4 MG/2ML IJ SOLN
INTRAMUSCULAR | Status: AC
  Filled 2024-01-02: qty 2

## 2024-01-02 MED ORDER — MIDAZOLAM HCL 2 MG/2ML IJ SOLN
INTRAMUSCULAR | Status: AC
  Filled 2024-01-02: qty 2

## 2024-01-02 MED ORDER — ROCURONIUM BROMIDE 10 MG/ML (PF) SYRINGE
PREFILLED_SYRINGE | INTRAVENOUS | Status: DC | PRN
  Administered 2024-01-02: 30 mg via INTRAVENOUS

## 2024-01-02 MED ORDER — POVIDONE-IODINE 10 % EX SWAB
2.0000 | Freq: Once | CUTANEOUS | Status: DC
Start: 2024-01-02 — End: 2024-01-02

## 2024-01-02 MED ORDER — CHLORHEXIDINE GLUCONATE 4 % EX SOLN
60.0000 mL | Freq: Once | CUTANEOUS | Status: DC
Start: 2024-01-02 — End: 2024-01-02

## 2024-01-02 MED ORDER — FENTANYL CITRATE (PF) 100 MCG/2ML IJ SOLN
INTRAMUSCULAR | Status: AC
  Filled 2024-01-02: qty 2

## 2024-01-02 MED ORDER — VANCOMYCIN HCL 1000 MG IV SOLR
INTRAVENOUS | Status: AC
Start: 2024-01-02 — End: 2024-01-02
  Filled 2024-01-02: qty 20

## 2024-01-02 MED ORDER — DEXAMETHASONE SODIUM PHOSPHATE 10 MG/ML IJ SOLN
INTRAMUSCULAR | Status: AC
Start: 2024-01-02 — End: 2024-01-02
  Filled 2024-01-02: qty 1

## 2024-01-02 MED ORDER — LIDOCAINE 2% (20 MG/ML) 5 ML SYRINGE
INTRAMUSCULAR | Status: AC
  Filled 2024-01-02: qty 5

## 2024-01-02 MED ORDER — DEXAMETHASONE SODIUM PHOSPHATE 10 MG/ML IJ SOLN
INTRAMUSCULAR | Status: DC | PRN
Start: 2024-01-02 — End: 2024-01-02
  Administered 2024-01-02: 4 mg via INTRAVENOUS

## 2024-01-02 SURGICAL SUPPLY — 59 items
BAG COUNTER SPONGE SURGICOUNT (BAG) ×1 IMPLANT
BIT DRILL 4.3X300MM (BIT) IMPLANT
BIT DRILL LONG 3.3 (BIT) IMPLANT
BIT DRILL QC 3.3X195 (BIT) IMPLANT
BLADE CLIPPER SURG (BLADE) IMPLANT
BNDG COHESIVE 6X5 TAN ST LF (GAUZE/BANDAGES/DRESSINGS) ×1 IMPLANT
BNDG ELASTIC 4INX 5YD STR LF (GAUZE/BANDAGES/DRESSINGS) IMPLANT
BNDG ELASTIC 6INX 5YD STR LF (GAUZE/BANDAGES/DRESSINGS) IMPLANT
BNDG ELASTIC 6X10 VLCR STRL LF (GAUZE/BANDAGES/DRESSINGS) ×1 IMPLANT
BRUSH SCRUB EZ PLAIN DRY (MISCELLANEOUS) ×2 IMPLANT
CANISTER SUCTION 3000ML PPV (SUCTIONS) ×1 IMPLANT
CAP LOCK NCB (Cap) IMPLANT
CHLORAPREP W/TINT 26 (MISCELLANEOUS) ×1 IMPLANT
COVER SURGICAL LIGHT HANDLE (MISCELLANEOUS) ×1 IMPLANT
DRAPE C-ARM 42X72 X-RAY (DRAPES) ×1 IMPLANT
DRAPE C-ARMOR (DRAPES) ×1 IMPLANT
DRAPE HALF SHEET 40X57 (DRAPES) ×2 IMPLANT
DRAPE SURG 17X23 STRL (DRAPES) ×1 IMPLANT
DRAPE SURG ORHT 6 SPLT 77X108 (DRAPES) ×2 IMPLANT
DRAPE U-SHAPE 47X51 STRL (DRAPES) ×1 IMPLANT
DRESSING MEPILEX FLEX 4X4 (GAUZE/BANDAGES/DRESSINGS) IMPLANT
DRSG ADAPTIC 3X8 NADH LF (GAUZE/BANDAGES/DRESSINGS) IMPLANT
DRSG MEPILEX POST OP 4X12 (GAUZE/BANDAGES/DRESSINGS) IMPLANT
DRSG MEPILEX POST OP 4X8 (GAUZE/BANDAGES/DRESSINGS) IMPLANT
ELECTRODE REM PT RTRN 9FT ADLT (ELECTROSURGICAL) ×1 IMPLANT
GAUZE PAD ABD 8X10 STRL (GAUZE/BANDAGES/DRESSINGS) ×3 IMPLANT
GAUZE SPONGE 4X4 12PLY STRL (GAUZE/BANDAGES/DRESSINGS) ×1 IMPLANT
GLOVE BIO SURGEON STRL SZ 6.5 (GLOVE) ×3 IMPLANT
GLOVE BIO SURGEON STRL SZ7.5 (GLOVE) ×4 IMPLANT
GLOVE BIOGEL PI IND STRL 6.5 (GLOVE) ×1 IMPLANT
GLOVE BIOGEL PI IND STRL 7.5 (GLOVE) ×1 IMPLANT
GOWN STRL REUS W/ TWL LRG LVL3 (GOWN DISPOSABLE) ×3 IMPLANT
KIT BASIN OR (CUSTOM PROCEDURE TRAY) ×1 IMPLANT
KIT TURNOVER KIT B (KITS) ×1 IMPLANT
KWIRE FXSTD 280X2XNS SS (WIRE) IMPLANT
NS IRRIG 1000ML POUR BTL (IV SOLUTION) ×1 IMPLANT
PACK TOTAL JOINT (CUSTOM PROCEDURE TRAY) ×1 IMPLANT
PAD ARMBOARD POSITIONER FOAM (MISCELLANEOUS) ×1 IMPLANT
PAD CAST 4YDX4 CTTN HI CHSV (CAST SUPPLIES) ×1 IMPLANT
PADDING CAST COTTON 6X4 STRL (CAST SUPPLIES) ×1 IMPLANT
PLATE DIST FEM 12H (Plate) IMPLANT
SCREW 5.0 70MM (Screw) IMPLANT
SCREW 5.0 80MM (Screw) IMPLANT
SCREW CORTICAL NCB 5.0X40 (Screw) IMPLANT
SCREW NCB 3.5X75X5X6.2XST (Screw) IMPLANT
SCREW NCB 4.0MX34M (Screw) IMPLANT
SCREW NCB 5.0X36MM (Screw) IMPLANT
SPONGE T-LAP 18X18 ~~LOC~~+RFID (SPONGE) IMPLANT
STAPLER SKIN PROX 35W (STAPLE) ×1 IMPLANT
SUCTION TUBE FRAZIER 10FR DISP (SUCTIONS) ×1 IMPLANT
SUT ETHILON 3 0 PS 1 (SUTURE) ×2 IMPLANT
SUT MNCRL AB 3-0 PS2 18 (SUTURE) IMPLANT
SUT MON AB 2-0 SH27 (SUTURE) IMPLANT
SUT VIC AB 0 CT1 27XBRD ANBCTR (SUTURE) IMPLANT
SUT VIC AB 1 CT1 27XBRD ANBCTR (SUTURE) IMPLANT
SUT VIC AB 2-0 CT1 TAPERPNT 27 (SUTURE) ×2 IMPLANT
TOWEL GREEN STERILE (TOWEL DISPOSABLE) ×2 IMPLANT
TRAY FOLEY MTR SLVR 16FR STAT (SET/KITS/TRAYS/PACK) IMPLANT
WATER STERILE IRR 1000ML POUR (IV SOLUTION) ×2 IMPLANT

## 2024-01-02 NOTE — Progress Notes (Signed)
 Ortho Trauma Note  Discussed risks and benefits of surgical management with the patient's parents again.  Although they are still reluctant to proceed they understand that is likely best for the patient's outcome and recovery.  Will plan to proceed with open reduction internal fixation.  Risks and benefits were discussed again.  They agreed to proceed with surgery and consent was obtained.  Franky MYRTIS Light, MD [facility] [phone] (office) [email or web address]

## 2024-01-02 NOTE — Anesthesia Postprocedure Evaluation (Signed)
 Anesthesia Post Note  Patient: Molly Duffy  Procedure(s) Performed: OPEN REDUCTION INTERNAL FIXATION (ORIF) DISTAL FEMUR FRACTURE (Left)     Patient location during evaluation: PACU Anesthesia Type: General Level of consciousness: confused and sedated Pain management: pain level controlled Vital Signs Assessment: post-procedure vital signs reviewed and stable Respiratory status: spontaneous breathing, nonlabored ventilation, respiratory function stable and patient connected to nasal cannula oxygen Cardiovascular status: blood pressure returned to baseline and stable Postop Assessment: no apparent nausea or vomiting Anesthetic complications: no Comments: Parents at bedside with multiple post-op analgesia concerns. They were asking about scheduled narcotics, which I advised was rarely done due to concerns of respiratory depression. They also asked about why a nerve block was not done preop, to which I replied that nerve blocks require some element of patient cooperation which, with the patient's cognitive deficits, would have been difficult to obtain without heavy sedation (which would lead to higher risk of unrecognized nerve injury). Parents understand.   No notable events documented.  Last Vitals:  Vitals:   01/02/24 1045 01/02/24 1100  BP: (!) 113/95 132/81  Pulse: 95 93  Resp: 10 11  Temp:    SpO2: 100% 100%    Last Pain:  Vitals:   01/02/24 1045  TempSrc:   PainSc: Asleep                 Rome Ade

## 2024-01-02 NOTE — Plan of Care (Signed)

## 2024-01-02 NOTE — H&P (View-Only) (Signed)
 Ortho Trauma Note  Discussed risks and benefits of surgical management with the patient's parents again.  Although they are still reluctant to proceed they understand that is likely best for the patient's outcome and recovery.  Will plan to proceed with open reduction internal fixation.  Risks and benefits were discussed again.  They agreed to proceed with surgery and consent was obtained.  Franky MYRTIS Light, MD [facility] [phone] (office) [email or web address]

## 2024-01-02 NOTE — Anesthesia Preprocedure Evaluation (Signed)
 Anesthesia Evaluation  Patient identified by MRN, date of birth, ID band Patient confused    Reviewed: Allergy & Precautions, NPO status , Patient's Chart, lab work & pertinent test results  History of Anesthesia Complications Negative for: history of anesthetic complications  Airway Mallampati: Unable to assess  TM Distance: >3 FB Neck ROM: Full    Dental  (+) Poor Dentition   Pulmonary neg pulmonary ROS, neg sleep apnea, neg COPD, Patient abstained from smoking.Not current smoker   Pulmonary exam normal breath sounds clear to auscultation       Cardiovascular Exercise Tolerance: Good METS(-) hypertension(-) CAD and (-) Past MI negative cardio ROS (-) dysrhythmias  Rhythm:Regular Rate:Normal - Systolic murmurs    Neuro/Psych Seizures -, Well Controlled,  Last grand mal seizure 1 year ago. 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)  Reproductive/Obstetrics                              Anesthesia Physical Anesthesia Plan  ASA: 3  Anesthesia Plan: General   Post-op Pain Management: Tylenol  PO (pre-op)*   Induction: Intravenous  PONV Risk Score and Plan: Ondansetron , Dexamethasone  and Midazolam   Airway Management Planned: Oral ETT  Additional Equipment: None  Intra-op Plan:   Post-operative Plan: Extubation in OR  Informed Consent: I have reviewed the patients History and Physical, chart, labs and discussed the procedure including the risks, benefits and alternatives for the proposed anesthesia with the patient or authorized representative who  has indicated his/her understanding and acceptance.     Dental advisory given and Consent reviewed with POA  Plan Discussed with: CRNA and Surgeon  Anesthesia Plan Comments: (Discussed risks of anesthesia with patient's mother (HCP) and father, including PONV, sore throat, lip/dental/eye damage. Rare risks discussed as well, such as cardiorespiratory and neurological sequelae, and allergic reactions. Discussed the role of CRNA in patient's perioperative care. Patient understands.)        Anesthesia Quick Evaluation

## 2024-01-02 NOTE — Transfer of Care (Signed)
 Immediate Anesthesia Transfer of Care Note  Patient: Molly Duffy  Procedure(s) Performed: OPEN REDUCTION INTERNAL FIXATION (ORIF) DISTAL FEMUR FRACTURE (Left)  Patient Location: PACU  Anesthesia Type:General  Level of Consciousness: drowsy  Airway & Oxygen Therapy: Patient Spontanous Breathing and Patient connected to face mask oxygen  Post-op Assessment: Report given to RN and Post -op Vital signs reviewed and stable  Post vital signs: Reviewed and stable  Last Vitals:  Vitals Value Taken Time  BP 143/63 01/02/24 10:06  Temp    Pulse 100 01/02/24 10:09  Resp 12 01/02/24 10:09  SpO2 100 % 01/02/24 10:09  Vitals shown include unfiled device data.  Last Pain:  Vitals:   01/02/24 0722  TempSrc:   PainSc: 0-No pain      Patients Stated Pain Goal: 0 (01/02/24 0053)  Complications: No notable events documented.

## 2024-01-02 NOTE — Op Note (Signed)
 Orthopaedic Surgery Operative Note (CSN: [id number] ) Date of Surgery: 01/02/2024  Admit Date: 12/30/2023   Diagnoses: Pre-Op Diagnoses: Left supracondylar distal femur fracture   Post-Op Diagnosis: Same  Procedures: CPT 27511-Open reduction internal fixation of left distal femur fracture  Surgeons : Primary: Kendal Franky SQUIBB, MD  Assistant: Lauraine Moores, PA-C  Location: OR 3   Anesthesia: General   Antibiotics: Ancef  2g preop with 1 gm vancomycin  powder placed topically   Tourniquet time: None    Estimated Blood Loss: 50 mL  Complications:* No complications entered in OR log *   Specimens:* No specimens in log *   Implants: Implant Name Type Inv. Item Serial No. Manufacturer Lot No. LRB No. Used Action  CAP LOCK NCB - [id number] Cap CAP LOCK NCB  [employer](ORTH,TRAU,BIO,SG)  Left 7 Implanted  PLATE DIST FEM 87Y - [id number] Plate PLATE DIST FEM 87Y  [employer](ORTH,TRAU,BIO,SG)  Left 1 Implanted  SCREW 5.0 - [id number] Screw SCREW 5.0  [employer](ORTH,TRAU,BIO,SG)  Left 1 Implanted  SCREW 5.0 - [id number] Screw SCREW 5.0  [employer](ORTH,TRAU,BIO,SG)  Left 3 Implanted  SCREW NCB 5.0X36MM - [id number] Screw SCREW NCB 5.0X36MM  [employer](ORTH,TRAU,BIO,SG)  Left 1 Implanted  SCREW CORTICAL NCB 5.0X40 - [id number] Screw SCREW CORTICAL NCB 5.0X40  [employer](ORTH,TRAU,BIO,SG)  Left 1 Implanted     Indications for Surgery: 53 year old female with a history of seizure disorder as well as developmental delay that sustained a fall and a left distal femur fracture.  I discussed risks and benefits of pursuing nonoperative versus surgical management.  I discussed the improved mobilization and pain control and quicker weightbearing and mobility with the open reduction internal fixation versus risks associated with nonoperative management including prolonged immobility bedsores, pneumonias, urinary tract infections and persistent pain and potential for  displacement of the fracture.  After full discussion the patient's family wished to pursue surgical intervention.  Risks for surgery included bleeding, infection, malunion, nonunion, hardware failure, hardware rotation, nerve and blood vessel injury, DVT, even the possibility anesthetic complications.  They agreed to proceed with surgery and consent was obtained.  Operative Findings: 1.  Open reduction internal fixation of left supracondylar femur fracture using [employer] NCB distal femoral locking plate.  Procedure: The patient was identified in the preoperative holding area. Consent was confirmed with the patient and their family and all questions were answered. The operative extremity was marked after confirmation with the patient. she was then brought back to the operating room by our anesthesia colleagues.  She was placed under general anesthetic and carefully transferred over to a radiolucent flattop table.  A bump was placed under her operative hip.  The left lower extremity was then prepped and draped in usual sterile fashion.  A timeout was performed to verify the patient, the procedure, and the extremity.  Preoperative antibiotics were dosed.  The hip and knee were flexed over a triangle and fluoroscopic imaging showed the unstable nature of her injury.  A lateral approach to the distal femur was made and carried down through skin and subcutaneous tissue.  I incised through the IT band and expose the lateral condyle the femur.  I then attached a 12 hole [employer] NCB distal femoral locking plate to a targeting arm and slid submuscularly along the lateral cortex of the femur.  I held the distal portion and placed with a 2.0 mm guidewire.  The proximal portion of the plate was held in place with a 3.3 mm drill  bit.  I then drilled and placed a 5.0 millimeter screws distally to bring the plate flush to bone.  I then drilled and placed 5.0 millimeter screws in the femoral shaft.  The 3.3 mm  drill bit proximally was removed and a 4.0 millimeter screw was placed.  Alignment was maintained through the sagittal and coronal views.  I return to the distal segment and placed a total of 5 screws. The locking caps were placed and the 5.0 mm screws in the shaft and distal segment.  Final fluoroscopic imaging was obtained. The incision was copiously irrigated.  A gram of local Multi-Podus placed into the incision.  A layered closure of 0 Vicryl, 2-0 Monocryl and 3-0 Monocryl with Dermabond was used to close the skin.  Sterile dressings were applied.  Patient was then awoke from anesthesia and taken to the PACU in stable condition.  Post Op Plan/Instructions: The patient will be weightbearing as tolerated to the left lower extremity.  She received postoperative Ancef .  She will receive aspirin  for DVT prophylaxis.  Will have her mobilize with physical and Occupational Therapy.  No immobilization is needed.  I was present and performed the entire surgery.  Lauraine Moores, PA-C did assist me throughout the case. An assistant was necessary given the difficulty in approach, maintenance of reduction and ability to instrument the fracture.   Franky Light, MD [facility]

## 2024-01-02 NOTE — Progress Notes (Addendum)
 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 She underwent left femur open reduction and internal fixation on 9/17. Orthopedics on board. PT/OT eval after surgery pending.  Assessment & Plan:  Principal Problem:   Femur fracture, left (HCC)   Acute Comminuted left femoral fracture,POA: s/p  left femur open reduction and internal fixation done on 9/17. -prn analgesics -PT/OT  Leukocytosis,POA: frequent history of UTIs-cultures 50,000 E. 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 Spoke to her mother at the bedside. She needs hoyer lift at home She did receive a unit of blood this morning. As per her mother, she has never received a blood transfusion in the past.  Examination:  General exam: Appears calm and comfortable, underlying developmental delay so unable to communicate effectively  Respiratory system: Clear to auscultation. Respiratory effort normal. Cardiovascular system: S1 & S2 heard, RRR. No JVD, murmurs, rubs, gallops or clicks. No pedal edema. Gastrointestinal system: Abdomen is nondistended, soft and nontender. No organomegaly or masses felt. Normal bowel sounds heard. Central nervous system: Unable to communicate effectively, at baseline Extremities: Left leg dressing in place      Diet Orders (From admission, onward)     Start     Ordered   01/02/24 1156  Diet regular Room service appropriate? Yes; Fluid consistency: Thin  Diet effective now       Question Answer Comment  Room service appropriate? Yes   Fluid consistency: Thin      01/02/24 1155            Objective: Vitals:   01/02/24 1100 01/02/24 1115 01/02/24 1130 01/02/24 1152  BP: 132/81 (!) 143/75 139/75 (!) 150/73  Pulse: 93 93 90 (!) 104  Resp: 11 11 12 18   Temp:   99.3 F (37.4 C) 98.4 F (36.9 C)  TempSrc:    Oral  SpO2: 100% 100% 100% 97%  Weight:      Height:        Intake/Output Summary (Last 24 hours) at 01/02/2024 1412 Last data filed at 01/02/2024 0948 Gross per 24 hour  Intake 380 ml  Output 100 ml  Net 280  ml   Filed Weights   12/30/23 1314 01/02/24 0713  Weight: 69.9 kg 69.9 kg    Scheduled Meds:  amoxicillin -clavulanate  1 tablet Oral Q12H   [START ON 01/03/2024] aspirin   325 mg Oral Daily   cannabidiol   600 mg Oral BID   Chlorhexidine  Gluconate Cloth  6 each Topical Daily   divalproex   125 mg Oral 4 times per day   docusate sodium   100 mg Oral BID   estradiol    0.1 g Vaginal Once per day on Monday Wednesday Friday   Fenfluramine  HCl  3 mL Oral TID   methenamine   1 g Oral BID   polyethylene glycol  17 g Oral Daily   senna-docusate  1 tablet Oral BID   zonisamide   100 mg Oral QHS   And   zonisamide   75 mg Oral QHS   Continuous Infusions:  sodium chloride       Nutritional status     Body mass index is 24.86 kg/m.  Data Reviewed:   CBC: Recent Labs  Lab 12/30/23 1525 12/31/23 0517 01/02/24 0254  WBC 11.6* 11.9* 12.7*  NEUTROABS  --   --  6.0  HGB 10.4* 8.6* 6.3*  HCT 33.4* 26.9* 19.9*  MCV 92.8 92.8 93.4  PLT 113* 116* 100*   Basic Metabolic Panel: Recent Labs  Lab 12/30/23 1525 12/31/23 0517 01/02/24 0254  NA 136 134* 137  K 3.7 3.5 3.4*  CL 101 100 102  CO2 24 24 27   GLUCOSE 150* 163* 117*  BUN 17 18 8   CREATININE 0.56 0.66 0.57  CALCIUM 9.2 8.4* 8.2*   GFR: Estimated Creatinine Clearance: 77 mL/min (by C-G formula based on SCr of 0.57 mg/dL). Liver Function Tests: Recent Labs  Lab 12/31/23 0517  AST 26  ALT 28  ALKPHOS 60  BILITOT 0.3  PROT 6.5  ALBUMIN 2.8*   No results for input(s): LIPASE, AMYLASE in the last 168 hours. No results for input(s): AMMONIA in the last 168 hours. Coagulation Profile: Recent Labs  Lab 12/31/23 0517  INR 1.1   Cardiac Enzymes: No results for input(s): CKTOTAL, CKMB, CKMBINDEX, TROPONINI in the last 168 hours. BNP (last 3 results) No results for input(s): PROBNP in the last 8760 hours. HbA1C: No results for input(s): HGBA1C in the last 72 hours. CBG: No results for input(s): GLUCAP in the last 168 hours. Lipid Profile: No results for input(s): CHOL, HDL, LDLCALC, TRIG, CHOLHDL, LDLDIRECT in the last 72 hours. Thyroid  Function Tests: No results for input(s): TSH, T4TOTAL, FREET4, T3FREE, THYROIDAB in the last 72 hours. Anemia Panel: Recent Labs    12/31/23 0517  FERRITIN 10*  TIBC 438  IRON 23*   Sepsis Labs: No  results for input(s): PROCALCITON, LATICACIDVEN in the last 168 hours.  Recent Results (from the past 240 hours)  Urine Culture     Status: Abnormal   Collection Time: 12/30/23  5:16 PM   Specimen: Urine, Random  Result Value Ref Range Status   Specimen Description   Final    URINE, RANDOM Performed at [facility], [address] [place], [place] [address]    Special Requests   Final    NONE Reflexed from [id number] Performed at [facility], [address], [place], [place] [address]    Culture 50,000 COLONIES/mL ESCHERICHIA COLI (A)  Final   Report Status 01/01/2024 FINAL  Final   Organism ID, Bacteria ESCHERICHIA COLI (A)  Final      Susceptibility  Escherichia coli - MIC*    AMPICILLIN <=2 SENSITIVE Sensitive     CEFAZOLIN  (URINE) Value in next row Sensitive      <=1 SENSITIVEThis is a modified FDA-approved test that has been validated and its performance characteristics determined by the reporting laboratory.  This laboratory is certified under the Clinical Laboratory Improvement Amendments CLIA as qualified to perform high complexity clinical laboratory testing.    CEFEPIME Value in next row Sensitive      <=1 SENSITIVEThis is a modified FDA-approved test that has been validated and its performance characteristics determined by the reporting laboratory.  This laboratory is certified under the Clinical Laboratory Improvement Amendments CLIA as qualified to perform high complexity clinical laboratory testing.    ERTAPENEM Value in next row Sensitive      <=1 SENSITIVEThis is a modified FDA-approved test that has been validated and its performance characteristics determined by the reporting laboratory.  This laboratory is certified under the Clinical Laboratory Improvement Amendments CLIA as qualified to perform high complexity clinical laboratory testing.    CEFTRIAXONE  Value in next row Sensitive      <=1 SENSITIVEThis is a modified  FDA-approved test that has been validated and its performance characteristics determined by the reporting laboratory.  This laboratory is certified under the Clinical Laboratory Improvement Amendments CLIA as qualified to perform high complexity clinical laboratory testing.    CIPROFLOXACIN  Value in next row Intermediate      <=1 SENSITIVEThis is a modified FDA-approved test that has been validated and its performance characteristics determined by the reporting laboratory.  This laboratory is certified under the Clinical Laboratory Improvement Amendments CLIA as qualified to perform high complexity clinical laboratory testing.    GENTAMICIN Value in next row Sensitive      <=1 SENSITIVEThis is a modified FDA-approved test that has been validated and its performance characteristics determined by the reporting laboratory.  This laboratory is certified under the Clinical Laboratory Improvement Amendments CLIA as qualified to perform high complexity clinical laboratory testing.    NITROFURANTOIN Value in next row Sensitive      <=1 SENSITIVEThis is a modified FDA-approved test that has been validated and its performance characteristics determined by the reporting laboratory.  This laboratory is certified under the Clinical Laboratory Improvement Amendments CLIA as qualified to perform high complexity clinical laboratory testing.    TRIMETH/SULFA Value in next row Sensitive      <=1 SENSITIVEThis is a modified FDA-approved test that has been validated and its performance characteristics determined by the reporting laboratory.  This laboratory is certified under the Clinical Laboratory Improvement Amendments CLIA as qualified to perform high complexity clinical laboratory testing.    AMPICILLIN/SULBACTAM Value in next row Sensitive      <=1 SENSITIVEThis is a modified FDA-approved test that has been validated and its performance characteristics determined by the reporting laboratory.  This laboratory is  certified under the Clinical Laboratory Improvement Amendments CLIA as qualified to perform high complexity clinical laboratory testing.    PIP/TAZO Value in next row Sensitive ug/mL     <=4 SENSITIVEThis is a modified FDA-approved test that has been validated and its performance characteristics determined by the reporting laboratory.  This laboratory is certified under the Clinical Laboratory Improvement Amendments CLIA as qualified to perform high complexity clinical laboratory testing.    MEROPENEM Value in next row Sensitive      <=4 SENSITIVEThis is a modified FDA-approved test that has been validated and its performance characteristics determined by the reporting laboratory.  This laboratory is certified under the Clinical Laboratory Improvement Amendments CLIA as qualified to perform high complexity clinical laboratory testing.    * 50,000 COLONIES/mL ESCHERICHIA COLI         Radiology Studies: DG FEMUR PORT MIN 2 VIEWS LEFT Result Date: 01/02/2024 CLINICAL DATA:  Fracture, postop. EXAM: LEFT FEMUR PORTABLE 2 VIEWS COMPARISON:  Preoperative imaging. FINDINGS: Lateral plate and screw fixation of distal femur fracture. Decreased displacement compared to preoperative imaging. Overlying brace in place. Air and edema in the soft tissues related to recent surgery. IMPRESSION: ORIF of distal femur fracture. Electronically Signed   By: Andrea Gasman M.D.   On: 01/02/2024 11:08   DG FEMUR MIN 2 VIEWS LEFT Result Date: 01/02/2024 CLINICAL DATA:  Elective surgery. EXAM: LEFT FEMUR 2 VIEWS COMPARISON:  Preoperative imaging FINDINGS: Seven fluoroscopic spot views of the left femur submitted from the operating room. Imaging obtained during plate and screw fixation of distal femur fracture. Fluoroscopy time 54.8 seconds. Dose 5.47 mGy. IMPRESSION: Intraoperative fluoroscopy during distal femur fracture ORIF. Electronically Signed   By: Andrea Gasman M.D.   On: 01/02/2024 11:01   DG C-Arm 1-60 Min-No  Report Result Date: 01/02/2024 Fluoroscopy was utilized by the requesting physician.  No radiographic interpretation.   DG Abd Portable 1V Result Date: 01/01/2024 CLINICAL DATA:  Constipation EXAM: PORTABLE ABDOMEN - 1 VIEW COMPARISON:  None Available. FINDINGS: Nonobstructive bowel gas pattern. No free air or pneumatosis. Moderate volume stool projects over the descending and rectosigmoid colon. No abnormal radio-opaque calculi or mass effect. No acute or substantial osseous abnormality. The sacrum and coccyx are partially obscured by overlying bowel contents. IMPRESSION: Nonobstructive bowel gas pattern. Moderate volume stool projects over the descending and rectosigmoid colon. Electronically Signed   By: Limin  Xu M.D.   On: 01/01/2024 10:57         LOS: 3 days   Time spent= 41 mins    Deliliah Room, MD [facility]  If 7PM-7AM, please contact night-coverage  01/02/2024, 2:12 PM

## 2024-01-02 NOTE — Interval H&P Note (Signed)
 History and Physical Interval Note:  01/02/2024 8:19 AM  Molly Duffy Mechanic  has presented today for surgery, with the diagnosis of left distal femur fracture.  The various methods of treatment have been discussed with the patient and family. After consideration of risks, benefits and other options for treatment, the patient has consented to  Procedure(s): OPEN REDUCTION INTERNAL FIXATION (ORIF) DISTAL FEMUR FRACTURE (Left) as a surgical intervention.  The patient's history has been reviewed, patient examined, no change in status, stable for surgery.  I have reviewed the patient's chart and labs.  Questions were answered to the patient's satisfaction.     Alajiah Dutkiewicz P Rhodie Cienfuegos

## 2024-01-02 NOTE — Anesthesia Procedure Notes (Signed)
 Procedure Name: Intubation Date/Time: 01/02/2024 8:48 AM  Performed by: Mannie Krystal LABOR, CRNAPre-anesthesia Checklist: Patient identified, Emergency Drugs available, Suction available and Patient being monitored Patient Re-evaluated:Patient Re-evaluated prior to induction Oxygen Delivery Method: Circle system utilized Preoxygenation: Pre-oxygenation with 100% oxygen Induction Type: IV induction Ventilation: Mask ventilation without difficulty Laryngoscope Size: Miller and 2 Grade View: Grade I Tube type: Oral Tube size: 7.0 mm Number of attempts: 1 Airway Equipment and Method: Stylet and Oral airway Placement Confirmation: ETT inserted through vocal cords under direct vision, positive ETCO2 and breath sounds checked- equal and bilateral Secured at: 22 cm Tube secured with: Tape Dental Injury: Teeth and Oropharynx as per pre-operative assessment

## 2024-01-02 NOTE — Plan of Care (Signed)
 Patient calm and cooperative alert to self and place, mother at bedside. Lab called with a critical hema globin value, hospitalist notified and new orders placed. Patient received one unit of blood, no indications of reaction during transfusion. Port in right chest patent and maintained. Brace on left leg maintained. Patient and mother left with call bell in reach and side rails up.  Problem: Education: Goal: Knowledge of General Education information will improve Description: Including pain rating scale, medication(s)/side effects and non-pharmacologic comfort measures Outcome: Progressing   Problem: Health Behavior/Discharge Planning: Goal: Ability to manage health-related needs will improve Outcome: Progressing   Problem: Clinical Measurements: Goal: Ability to maintain clinical measurements within normal limits will improve Outcome: Progressing   Problem: Activity: Goal: Risk for activity intolerance will decrease Outcome: Progressing   Problem: Coping: Goal: Level of anxiety will decrease Outcome: Progressing   Problem: Pain Managment: Goal: General experience of comfort will improve and/or be controlled Outcome: Progressing   Problem: Skin Integrity: Goal: Risk for impaired skin integrity will decrease Outcome: Progressing

## 2024-01-03 DIAGNOSIS — S7292XA Unspecified fracture of left femur, initial encounter for closed fracture: Secondary | ICD-10-CM | POA: Diagnosis not present

## 2024-01-03 LAB — CBC WITH DIFFERENTIAL/PLATELET
Abs Immature Granulocytes: 0.08 K/uL — ABNORMAL HIGH (ref 0.00–0.07)
Basophils Absolute: 0 K/uL (ref 0.0–0.1)
Basophils Relative: 0 %
Eosinophils Absolute: 0 K/uL (ref 0.0–0.5)
Eosinophils Relative: 0 %
HCT: 20.8 % — ABNORMAL LOW (ref 36.0–46.0)
Hemoglobin: 6.8 g/dL — CL (ref 12.0–15.0)
Immature Granulocytes: 1 %
Lymphocytes Relative: 19 %
Lymphs Abs: 2.6 K/uL (ref 0.7–4.0)
MCH: 30 pg (ref 26.0–34.0)
MCHC: 32.7 g/dL (ref 30.0–36.0)
MCV: 91.6 fL (ref 80.0–100.0)
Monocytes Absolute: 2.3 K/uL — ABNORMAL HIGH (ref 0.1–1.0)
Monocytes Relative: 17 %
Neutro Abs: 8.8 K/uL — ABNORMAL HIGH (ref 1.7–7.7)
Neutrophils Relative %: 63 %
Platelets: 95 K/uL — ABNORMAL LOW (ref 150–400)
RBC: 2.27 MIL/uL — ABNORMAL LOW (ref 3.87–5.11)
RDW: 15.4 % (ref 11.5–15.5)
WBC: 13.8 K/uL — ABNORMAL HIGH (ref 4.0–10.5)
nRBC: 0 % (ref 0.0–0.2)

## 2024-01-03 LAB — BASIC METABOLIC PANEL WITH GFR
Anion gap: 10 (ref 5–15)
BUN: 14 mg/dL (ref 6–20)
CO2: 28 mmol/L (ref 22–32)
Calcium: 8.1 mg/dL — ABNORMAL LOW (ref 8.9–10.3)
Chloride: 99 mmol/L (ref 98–111)
Creatinine, Ser: 0.73 mg/dL (ref 0.44–1.00)
GFR, Estimated: >60 mL/min (ref >60)
Glucose, Bld: 128 mg/dL — ABNORMAL HIGH (ref 70–99)
Potassium: 3.8 mmol/L (ref 3.5–5.1)
Sodium: 137 mmol/L (ref 135–145)

## 2024-01-03 LAB — PREPARE RBC (CROSSMATCH)

## 2024-01-03 MED ORDER — POLYETHYLENE GLYCOL 3350 17 G PO PACK
17.0000 g | PACK | Freq: Two times a day (BID) | ORAL | Status: DC
  Administered 2024-01-03: 17 g via ORAL
  Filled 2024-01-03: qty 1

## 2024-01-03 MED ORDER — BISACODYL 10 MG RE SUPP: 10.0000 mg | Freq: Every day | RECTAL | Status: DC | PRN

## 2024-01-03 MED ORDER — SODIUM CHLORIDE 0.9% IV SOLUTION: Freq: Once | INTRAVENOUS | Status: DC

## 2024-01-03 MED ORDER — FERROUS SULFATE 325 (65 FE) MG PO TABS
325.0000 mg | ORAL_TABLET | ORAL | Status: DC
  Administered 2024-01-05 – 2024-01-07 (×2): 325 mg via ORAL
  Filled 2024-01-03 (×2): qty 1

## 2024-01-03 MED ORDER — FERROUS SULFATE 325 (65 FE) MG PO TABS
325.0000 mg | ORAL_TABLET | Freq: Two times a day (BID) | ORAL | Status: DC
  Administered 2024-01-03: 325 mg via ORAL
  Filled 2024-01-03: qty 1

## 2024-01-03 NOTE — Progress Notes (Signed)
 Received patient from 2W. Patient noted to have severe swelling to labia as well as bruising to upper Left thigh. Bruising also on left labia. Dr. Perri made aware.

## 2024-01-03 NOTE — Progress Notes (Signed)
 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 She underwent left femur open reduction and internal fixation on 9/17. Orthopedics on board. PT/OT eval after surgery pending.  Assessment & Plan:  Principal Problem:   Femur fracture, left (HCC)   Acute Comminuted left femoral fracture,POA: CT from 9/14 noted comminuted fracture of the distal femoral diaphysis and metaphysis  s/p  left femur open reduction and internal fixation done on 9/17. -prn analgesics -PT/OT  Acute Blood Loss Anemia CT from 9/14 showed intramuscular and deep fascial plane hemorrhage surrounding the fractures Transfused one unit of PRBC on 9/17 Additional unit of pRBC 9/18 Continue to trend   Left Labial Swelling Bruising to LLQ and Left Leg Follow with CT abd/pelvis   Leukocytosis,POA: frequent history of UTIs-cultures 50,000 E. 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 Cardiovascular: RRR Lungs: unlabored Abdomen: bruising to lower left quadrant GU: left labial swelling Neurological: developmental delay, pleasant Extremities: bruising to upper left thigh - LLE swelling - dressing to LLE      Diet Orders (From admission, onward)     Start     Ordered   01/02/24 1156  Diet regular Room service appropriate? Yes; Fluid consistency: Thin  Diet effective now       Question Answer Comment  Room service appropriate? Yes   Fluid consistency: Thin      01/02/24 1155            Objective: Vitals:   01/03/24 1159 01/03/24 1214 01/03/24 1700 01/03/24 2023  BP: 99/61 99/61 (!) 114/58 104/80  Pulse: 96 96 100 (!) 105  Resp: 18 18 16    Temp: 98.2 F (36.8 C) 98.2 F (36.8 C) 98.5 F (36.9 C) 97.9 F (36.6 C)  TempSrc:  Oral Oral   SpO2:  98% 97% 95%  Weight:      Height:       No intake or output data in the 24 hours ending 01/03/24 2121  Filed Weights   12/30/23 1314 01/02/24 0713  Weight: 69.9 kg 69.9 kg    Scheduled Meds:  sodium chloride    Intravenous Once   amoxicillin -clavulanate  1 tablet Oral Q12H   aspirin   325 mg Oral Daily   cannabidiol   600 mg Oral BID  Chlorhexidine  Gluconate Cloth  6 each Topical Daily   divalproex   125 mg Oral 4 times per day   docusate sodium   100 mg Oral BID   estradiol   0.1 g Vaginal Once per day on Monday Wednesday Friday   Fenfluramine  HCl  3 mL Oral TID   ferrous sulfate   325 mg Oral BID WC   methenamine   1 g Oral BID   polyethylene glycol  17 g Oral BID   senna-docusate  1 tablet Oral BID   zonisamide   100 mg Oral QHS   And   zonisamide   75 mg Oral QHS    Continuous Infusions:    Nutritional status     Body mass index is 24.86 kg/m.  Data Reviewed:   CBC: Recent Labs  Lab 12/30/23 1525 12/31/23 0517 01/02/24 0254 01/03/24 0311  WBC 11.6* 11.9* 12.7* 13.8*  NEUTROABS  --   --  6.0 8.8*  HGB 10.4* 8.6* 6.3* 6.8*  HCT 33.4* 26.9* 19.9* 20.8*  MCV 92.8 92.8 93.4 91.6  PLT 113* 116* 100* 95*   Basic Metabolic Panel: Recent Labs  Lab 12/30/23 1525 12/31/23 0517 01/02/24 0254 01/03/24 0311  NA 136 134* 137 137  K 3.7 3.5 3.4* 3.8  CL 101 100 102 99  CO2 24 24 27 28   GLUCOSE 150* 163* 117* 128*  BUN 17 18 8 14   CREATININE 0.56 0.66 0.57 0.73  CALCIUM 9.2 8.4* 8.2* 8.1*   GFR: Estimated Creatinine Clearance: 77 mL/min (by C-G formula based on SCr of 0.73 mg/dL). Liver Function Tests: Recent Labs  Lab 12/31/23 0517  AST 26  ALT 28  ALKPHOS 60  BILITOT 0.3  PROT 6.5  ALBUMIN 2.8*   No results for input(s): LIPASE, AMYLASE in the last 168 hours. No results for input(s): AMMONIA in the last 168 hours. Coagulation Profile: Recent Labs  Lab 12/31/23 0517  INR 1.1   Cardiac Enzymes: No results for input(s): CKTOTAL, CKMB, CKMBINDEX, TROPONINI in the last 168 hours. BNP (last 3 results) No results for input(s): PROBNP in the last 8760 hours. HbA1C: No results for input(s): HGBA1C in the last 72 hours. CBG: No results for input(s): GLUCAP in the last 168 hours. Lipid Profile: No results for input(s): CHOL, HDL, LDLCALC, TRIG, CHOLHDL, LDLDIRECT in the last 72 hours. Thyroid  Function Tests: No results for input(s): TSH, T4TOTAL, FREET4, T3FREE, THYROIDAB in the last 72 hours. Anemia Panel: No results for input(s): VITAMINB12, FOLATE, FERRITIN, TIBC, IRON, RETICCTPCT in the last 72 hours.  Sepsis Labs: No results for input(s): PROCALCITON, LATICACIDVEN in the last 168 hours.  Recent Results (from the past 240 hours)  Urine Culture     Status:  Abnormal   Collection Time: 12/30/23  5:16 PM   Specimen: Urine, Random  Result Value Ref Range Status   Specimen Description   Final    URINE, RANDOM Performed at [facility], [address], [place], [place] [address]    Special Requests   Final    NONE Reflexed from [id number] Performed at [facility], [address], [place], [place] [address]    Culture 50,000 COLONIES/mL ESCHERICHIA COLI (Ruthvik Barnaby)  Final   Report Status 01/01/2024 FINAL  Final   Organism ID, Bacteria ESCHERICHIA COLI (Lexxie Winberg)  Final      Susceptibility   Escherichia coli - MIC*    AMPICILLIN <=2 SENSITIVE Sensitive     CEFAZOLIN  (URINE) Value in next row Sensitive      <=1 SENSITIVEThis  is Henchy Mccauley modified FDA-approved test that has been validated and its performance characteristics determined by the reporting laboratory.  This laboratory is certified under the Clinical Laboratory Improvement Amendments CLIA as qualified to perform high complexity clinical laboratory testing.    CEFEPIME Value in next row Sensitive      <=1 SENSITIVEThis is Katesha Eichel modified FDA-approved test that has been validated and its performance characteristics determined by the reporting laboratory.  This laboratory is certified under the Clinical Laboratory Improvement Amendments CLIA as qualified to perform high complexity clinical laboratory testing.    ERTAPENEM Value in next row Sensitive      <=1 SENSITIVEThis is Paije Goodhart modified FDA-approved test that has been validated and its performance characteristics determined by the reporting laboratory.  This laboratory is certified under the Clinical Laboratory Improvement Amendments CLIA as qualified to perform high complexity clinical laboratory testing.    CEFTRIAXONE  Value in next row Sensitive      <=1 SENSITIVEThis is Phylliss Strege modified FDA-approved test that has been validated and its performance characteristics determined by the reporting laboratory.  This laboratory is certified under the  Clinical Laboratory Improvement Amendments CLIA as qualified to perform high complexity clinical laboratory testing.    CIPROFLOXACIN  Value in next row Intermediate      <=1 SENSITIVEThis is Mackenzie Groom modified FDA-approved test that has been validated and its performance characteristics determined by the reporting laboratory.  This laboratory is certified under the Clinical Laboratory Improvement Amendments CLIA as qualified to perform high complexity clinical laboratory testing.    GENTAMICIN Value in next row Sensitive      <=1 SENSITIVEThis is Ahlivia Salahuddin modified FDA-approved test that has been validated and its performance characteristics determined by the reporting laboratory.  This laboratory is certified under the Clinical Laboratory Improvement Amendments CLIA as qualified to perform high complexity clinical laboratory testing.    NITROFURANTOIN Value in next row Sensitive      <=1 SENSITIVEThis is Siomara Burkel modified FDA-approved test that has been validated and its performance characteristics determined by the reporting laboratory.  This laboratory is certified under the Clinical Laboratory Improvement Amendments CLIA as qualified to perform high complexity clinical laboratory testing.    TRIMETH/SULFA Value in next row Sensitive      <=1 SENSITIVEThis is Ngan Qualls modified FDA-approved test that has been validated and its performance characteristics determined by the reporting laboratory.  This laboratory is certified under the Clinical Laboratory Improvement Amendments CLIA as qualified to perform high complexity clinical laboratory testing.    AMPICILLIN/SULBACTAM Value in next row Sensitive      <=1 SENSITIVEThis is Breezie Micucci modified FDA-approved test that has been validated and its performance characteristics determined by the reporting laboratory.  This laboratory is certified under the Clinical Laboratory Improvement Amendments CLIA as qualified to perform high complexity clinical laboratory testing.    PIP/TAZO Value in next  row Sensitive ug/mL     <=4 SENSITIVEThis is Burnett Lieber modified FDA-approved test that has been validated and its performance characteristics determined by the reporting laboratory.  This laboratory is certified under the Clinical Laboratory Improvement Amendments CLIA as qualified to perform high complexity clinical laboratory testing.    MEROPENEM Value in next row Sensitive      <=4 SENSITIVEThis is Wylma Tatem modified FDA-approved test that has been validated and its performance characteristics determined by the reporting laboratory.  This laboratory is certified under the Clinical Laboratory Improvement Amendments CLIA as qualified to perform high complexity clinical laboratory testing.    * 50,000 COLONIES/mL ESCHERICHIA COLI  Radiology Studies: DG FEMUR PORT MIN 2 VIEWS LEFT Result Date: 01/02/2024 CLINICAL DATA:  Fracture, postop. EXAM: LEFT FEMUR PORTABLE 2 VIEWS COMPARISON:  Preoperative imaging. FINDINGS: Lateral plate and screw fixation of distal femur fracture. Decreased displacement compared to preoperative imaging. Overlying brace in place. Air and edema in the soft tissues related to recent surgery. IMPRESSION: ORIF of distal femur fracture. Electronically Signed   By: Andrea Gasman M.D.   On: 01/02/2024 11:08   DG FEMUR MIN 2 VIEWS LEFT Result Date: 01/02/2024 CLINICAL DATA:  Elective surgery. EXAM: LEFT FEMUR 2 VIEWS COMPARISON:  Preoperative imaging FINDINGS: Seven fluoroscopic spot views of the left femur submitted from the operating room. Imaging obtained during plate and screw fixation of distal femur fracture. Fluoroscopy time 54.8 seconds. Dose 5.47 mGy. IMPRESSION: Intraoperative fluoroscopy during distal femur fracture ORIF. Electronically Signed   By: Andrea Gasman M.D.   On: 01/02/2024 11:01   DG C-Arm 1-60 Min-No Report Result Date: 01/02/2024 Fluoroscopy was utilized by the requesting physician.  No radiographic interpretation.         LOS: 4 days   Time spent=  41 mins    Meliton Monte, MD [facility]  If 7PM-7AM, please contact night-coverage  01/03/2024, 9:21 PM

## 2024-01-03 NOTE — Care Management Important Message (Signed)
 Important Message  Patient Details  Name: Molly Duffy MRN: [id number] Date of Birth: April 01, 1971   Important Message Given:        [name] 01/03/2024, 4:31 PM

## 2024-01-03 NOTE — Plan of Care (Signed)
 Beginning of shift patient's mother voiced concerns for patient care stating that patient had not had pain medication since 2pm. Dayshift nurse states patient's mother decline pain medications at 1815 due to patient eating dinner. Patient's mother states she called out at [facility] for pain medication. Patient was not grimacing or had any pain indications but mother stated she wanted to stay ahead of possible pain, as she was given educated by nurse the night before surgery. Patient's mother educated on shift change and also acuity of patients on unit. Nurse discussed necessity of self care and caregiver burn out with patient mother. Patient given full linen change, medications tolerated well, cap refill on lower extremities less than 3 seconds.   Problem: Education: Goal: Knowledge of General Education information will improve Description: Including pain rating scale, medication(s)/side effects and non-pharmacologic comfort measures Outcome: Progressing   Problem: Health Behavior/Discharge Planning: Goal: Ability to manage health-related needs will improve Outcome: Progressing   Problem: Activity: Goal: Risk for activity intolerance will decrease Outcome: Progressing   Problem: Nutrition: Goal: Adequate nutrition will be maintained Outcome: Progressing   Problem: Coping: Goal: Level of anxiety will decrease Outcome: Progressing   Problem: Pain Managment: Goal: General experience of comfort will improve and/or be controlled Outcome: Progressing   Problem: Safety: Goal: Ability to remain free from injury will improve Outcome: Progressing

## 2024-01-03 NOTE — Progress Notes (Signed)
 OT Cancellation Note  Patient Details Name: Molly Duffy MRN: [id number] DOB: 1970-05-26   Cancelled Treatment:    Reason Eval/Treat Not Completed: Patient at procedure or test/ unavailable (pt eating with assist of parents. Will attempt later as schedule allows. Parents with their daughter at all times.) If unable to return today will see in am.  [name],[name] 01/03/2024, 3:11 PM [name], OT/L   Acute OT Clinical Specialist Acute Rehabilitation Services Pager [phone] Office [phone]

## 2024-01-03 NOTE — Progress Notes (Signed)
 Hgb is 6.8 this morning. Plan to transfuse 1 unit RBCs.

## 2024-01-03 NOTE — Discharge Instructions (Signed)
 Orthopaedic Trauma Service Discharge Instructions   General Discharge Instructions  WEIGHT BEARING STATUS: Weightbearing as tolerated left lower extremity  RANGE OF MOTION/ACTIVITY: Unrestricted range of motion left knee  Wound Care: You may remove your surgical dressing on postoperative day #2 (Friday, 01/04/2024).  Incisions have been closed with dissolvable stitches and Dermabond (skin glue).  Incisions can be left open to air if there is no drainage. Once the incision is completely dry and without drainage, it may be left open to air out.  Showering may begin postoperative day #3 (Saturday, 01/05/2024).  Clean incision gently with soap and water.  DVT/PE prophylaxis: Aspirin  325 mg daily x 30 days  Diet: as you were eating previously.  Can use over the counter stool softeners and bowel preparations, such as Miralax , to help with bowel movements.  Narcotics can be constipating.  Be sure to drink plenty of fluids  PAIN MEDICATION USE AND EXPECTATIONS  You have likely been given narcotic medications to help control your pain.  After a traumatic event that results in an fracture (broken bone) with or without surgery, it is ok to use narcotic pain medications to help control one's pain.  We understand that everyone responds to pain differently and each individual patient will be evaluated on a regular basis for the continued need for narcotic medications. Ideally, narcotic medication use should last no more than 6-8 weeks (coinciding with fracture healing).   As a patient it is your responsibility as well to monitor narcotic medication use and report the amount and frequency you use these medications when you come to your office visit.   We would also advise that if you are using narcotic medications, you should take a dose prior to therapy to maximize you participation.  IF YOU ARE ON NARCOTIC MEDICATIONS IT IS NOT PERMISSIBLE TO OPERATE A MOTOR VEHICLE (MOTORCYCLE/CAR/TRUCK/MOPED) OR HEAVY  MACHINERY DO NOT MIX NARCOTICS WITH OTHER CNS (CENTRAL NERVOUS SYSTEM) DEPRESSANTS SUCH AS ALCOHOL   POST-OPERATIVE OPIOID TAPER INSTRUCTIONS: It is important to wean off of your opioid medication as soon as possible. If you do not need pain medication after your surgery it is ok to stop day one. Opioids include: Codeine, Hydrocodone (Norco, Vicodin), Oxycodone (Percocet, oxycontin ) and hydromorphone  amongst others.  Long term and even short term use of opiods can cause: Increased pain response Dependence Constipation Depression Respiratory depression And more.  Withdrawal symptoms can include Flu like symptoms Nausea, vomiting And more Techniques to manage these symptoms Hydrate well Eat regular healthy meals Stay active Use relaxation techniques(deep breathing, meditating, yoga) Do Not substitute Alcohol  to help with tapering If you have been on opioids for less than two weeks and do not have pain than it is ok to stop all together.  Plan to wean off of opioids This plan should start within one week post op of your fracture surgery  Maintain the same interval or time between taking each dose and first decrease the dose.  Cut the total daily intake of opioids by one tablet each day Next start to increase the time between doses. The last dose that should be eliminated is the evening dose.    STOP SMOKING OR USING NICOTINE PRODUCTS!!!!  As discussed nicotine severely impairs your body's ability to heal surgical and traumatic wounds but also impairs bone healing.  Wounds and bone heal by forming microscopic blood vessels (angiogenesis) and nicotine is a vasoconstrictor (essentially, shrinks blood vessels).  Therefore, if vasoconstriction occurs to these microscopic blood vessels they essentially disappear and  are unable to deliver necessary nutrients to the healing tissue.  This is one modifiable factor that you can do to dramatically increase your chances of healing your injury.  (This  means no smoking, no nicotine gum, patches, etc)  DO NOT USE NONSTEROIDAL ANTI-INFLAMMATORY DRUGS (NSAID'S)  Using products such as Advil (ibuprofen), Aleve (naproxen), Motrin (ibuprofen) for additional pain control during fracture healing can delay and/or prevent the healing response.  If you would like to take over the counter (OTC) medication, Tylenol  (acetaminophen ) is ok.  However, some narcotic medications that are given for pain control contain acetaminophen  as well. Therefore, you should not exceed more than 4000 mg of tylenol  in a day if you do not have liver disease.  Also note that there are may OTC medicines, such as cold medicines and allergy medicines that my contain tylenol  as well.  If you have any questions about medications and/or interactions please ask your doctor/PA or your pharmacist.      ICE AND ELEVATE INJURED/OPERATIVE EXTREMITY  Using ice and elevating the injured extremity above your heart can help with swelling and pain control.  Icing in a pulsatile fashion, such as 20 minutes on and 20 minutes off, can be followed.    Do not place ice directly on skin. Make sure there is a barrier between to skin and the ice pack.    Using frozen items such as frozen peas works well as the conform nicely to the are that needs to be iced.  USE AN ACE WRAP OR TED HOSE FOR SWELLING CONTROL  In addition to icing and elevation, Ace wraps or TED hose are used to help limit and resolve swelling.  It is recommended to use Ace wraps or TED hose until you are informed to stop.    When using Ace Wraps start the wrapping distally (farthest away from the body) and wrap proximally (closer to the body)   Example: If you had surgery on your leg or thing and you do not have a splint on, start the ace wrap at the toes and work your way up to the thigh        If you had surgery on your upper extremity and do not have a splint on, start the ace wrap at your fingers and work your way up to the upper  arm  CALL THE OFFICE FOR MEDICATION REFILLS OR WITH ANY QUESTIONS/CONCERNS: [phone]   VISIT OUR WEBSITE FOR ADDITIONAL INFORMATION: [email or web address]     Discharge Wound Care Instructions  Do NOT apply any ointments, solutions or lotions to pin sites or surgical wounds.  These prevent needed drainage and even though solutions like hydrogen peroxide kill bacteria, they also damage cells lining the pin sites that help fight infection.  Applying lotions or ointments can keep the wounds moist and can cause them to breakdown and open up as well. This can increase the risk for infection. When in doubt call the office.  Surgical incisions should be dressed daily.  If any drainage is noted, use one layer of adaptic or Mepitel, then gauze, Kerlix, and an ace wrap. - These dressing supplies should be available at local medical supply stores ([facility], [facility], etc) as well as Insurance claims handler ([facility], [facility], [place], etc)  Once the incision is completely dry and without drainage, it may be left open to air out.  Showering may begin 36-48 hours later.  Cleaning gently with soap and water.   Call office for the following: Temperature greater  than 101F Persistent nausea and vomiting Severe uncontrolled pain Redness, tenderness, or signs of infection (pain, swelling, redness, odor or green/yellow discharge around the site) Difficulty breathing, headache or visual disturbances Hives Persistent dizziness or light-headedness Extreme fatigue Any other questions or concerns you may have after discharge  In an emergency, call 911 or go to an Emergency Department at a nearby hospital  OTHER HELPFUL INFORMATION  If you had a block, it will wear off between 8-24 hrs postop typically.  This is period when your pain may go from nearly zero to the pain you would have had postop without the block.  This is an abrupt transition but nothing dangerous is happening.  You may take an extra  dose of narcotic when this happens.  You should wean off your narcotic medicines as soon as you are able.  Most patients will be off or using minimal narcotics before their first postop appointment.   We suggest you use the pain medication the first night prior to going to bed, in order to ease any pain when the anesthesia wears off. You should avoid taking pain medications on an empty stomach as it will make you nauseous.  Do not drink alcoholic beverages or take illicit drugs when taking pain medications.  In most states it is against the law to drive while you are in a splint or sling.  And certainly against the law to drive while taking narcotics.  You may return to work/school in the next couple of days when you feel up to it.   Pain medication may make you constipated.  Below are a few solutions to try in this order: Decrease the amount of pain medication if you aren't having pain. Drink lots of decaffeinated fluids. Drink prune juice and/or each dried prunes  If the first 3 don't work start with additional solutions Take Colace - an over-the-counter stool softener Take Senokot - an over-the-counter laxative Take Miralax  - a stronger over-the-counter laxative

## 2024-01-03 NOTE — Progress Notes (Addendum)
 Orthopaedic Trauma Progress Note  SUBJECTIVE: Doing okay this morning.  Pain in the leg manageable at rest.  Per mom's report, pain increases significantly with any motion.  They have been trying to keep Molly Duffy on a regimen of hydrocodone  every 4 hours which seems to be doing well and not making her too drowsy.  No chest pain. No SOB. No nausea/vomiting. Tolerating diet and fluids.  No BM since admission the patient has been rubbing her belly indicating some abdominal discomfort.  Plans for suppository today and if this is unsuccessful in producing a BM, will plan for enema. Patient's parents are at bedside.  They note there is been difficulty with administering patient's antiseizure medications on time.  They also note that the medication (Epidiolex ) that they have provided from home is often not sent up to the floor from pharmacy.  OBJECTIVE:  Vitals:   01/03/24 0556 01/03/24 0800  BP: (!) 141/96 (!) 104/58  Pulse: 85 90  Resp:  16  Temp: 97.7 F (36.5 C) 98.1 F (36.7 C)  SpO2: 98%     Opiates Today (MME): Today's  total administered Morphine  Milligram Equivalents: 15 Opiates Yesterday (MME): Yesterday's total administered Morphine  Milligram Equivalents: 85  General: Sitting up in bed comfortably, no acute distress.  Patient pleasant Respiratory: No increased work of breathing.  Operative Extremity (left lower extremity): Knee immobilizer removed.  Ace wrap dressings clean, dry, intact.  No significant tenderness with palpation throughout the lower leg, ankle, foot.  No significant calf tenderness.  Discomfort with palpation over the distal thigh as expected.  Tolerates gentle ankle range of motion.  Able to wiggle the toes.  Patient tolerates very little motion of the knee secondary to pain and anxiety.  Toes are warm and well-perfused. + DP pulse  IMAGING: Stable post op imaging left knee/femur  LABS:  Results for orders placed or performed during the hospital encounter of 12/30/23  (from the past 24 hours)  VITAMIN D  25 Hydroxy (Vit-D Deficiency, Fractures)     Status: None   Collection Time: 01/02/24  1:08 PM  Result Value Ref Range   Vit D, 25-Hydroxy 85.41 30 - 100 ng/mL  Basic metabolic panel with GFR     Status: Abnormal   Collection Time: 01/03/24  3:11 AM  Result Value Ref Range   Sodium 137 135 - 145 mmol/L   Potassium 3.8 3.5 - 5.1 mmol/L   Chloride 99 98 - 111 mmol/L   CO2 28 22 - 32 mmol/L   Glucose, Bld 128 (H) 70 - 99 mg/dL   BUN 14 6 - 20 mg/dL   Creatinine, Ser 9.26 0.44 - 1.00 mg/dL   Calcium 8.1 (L) 8.9 - 10.3 mg/dL   GFR, Estimated >39 >39 mL/min   Anion gap 10 5 - 15  CBC with Differential/Platelet     Status: Abnormal   Collection Time: 01/03/24  3:11 AM  Result Value Ref Range   WBC 13.8 (H) 4.0 - 10.5 K/uL   RBC 2.27 (L) 3.87 - 5.11 MIL/uL   Hemoglobin 6.8 (LL) 12.0 - 15.0 g/dL   HCT 79.1 (L) 63.9 - 53.9 %   MCV 91.6 80.0 - 100.0 fL   MCH 30.0 26.0 - 34.0 pg   MCHC 32.7 30.0 - 36.0 g/dL   RDW 84.5 88.4 - 84.4 %   Platelets 95 (L) 150 - 400 K/uL   nRBC 0.0 0.0 - 0.2 %   Neutrophils Relative % 63 %   Neutro Abs 8.8 (H)  1.7 - 7.7 K/uL   Lymphocytes Relative 19 %   Lymphs Abs 2.6 0.7 - 4.0 K/uL   Monocytes Relative 17 %   Monocytes Absolute 2.3 (H) 0.1 - 1.0 K/uL   Eosinophils Relative 0 %   Eosinophils Absolute 0.0 0.0 - 0.5 K/uL   Basophils Relative 0 %   Basophils Absolute 0.0 0.0 - 0.1 K/uL   Immature Granulocytes 1 %   Abs Immature Granulocytes 0.08 (H) 0.00 - 0.07 K/uL  Prepare RBC (crossmatch)     Status: None   Collection Time: 01/03/24  6:54 AM  Result Value Ref Range   Order Confirmation      ORDER PROCESSED BY BLOOD BANK Performed at [facility], [address] [place], [place] [address]     ASSESSMENT: Molly Duffy is a 53 y.o. female, 1 Day Post-Op s/p fall Procedures: OPEN REDUCTION INTERNAL FIXATION LEFT DISTAL FEMUR FRACTURE  CV/Blood loss: Acute blood loss anemia, Hgb 6.8 this AM.  Received 1  unit PRBCs perioperatively 01/02/2024.  Additional 1 unit ordered this AM.  BP soft when checked at 0800, otherwise hemodynamically stable.  PLAN: Weightbearing: WBAT LLE ROM: Unrestricted ROM.  No immobilization needed Incisional and dressing care: Reinforce dressings as needed  Showering: Okay to begin getting incisions wet starting 01/05/2024 Orthopedic device(s): None  Pain management:  1. Tylenol  650 mg q 6 hours PRN 2. Robaxin 500 mg q 6 hours PRN 3. Oxycodone  5-10 mg q 4 hours PRN 4. Dilaudid  0.5-1 mg q 4 hours PRN VTE prophylaxis: Aspirin , SCDs ID: Perioperative ABX completed.  Was previously on ceftriaxone  for UTI, switched to Augmentin  01/02/2024 Foley/Lines:  No foley, KVO IVFs Impediments to Fracture Healing: Vitamin D  level 85,no supplementation needed Dispo: PT/OT evaluation today.  Transfer patient to 5N today.  Plan to remove dressing LLE tomorrow 01/04/2024 if patient tolerates.   D/C recommendations: - Hydrocodone , Tylenol  for pain control - Aspirin  to 125 mg daily x 30 days for DVT prophylaxis - No additional need for Vit D supplementation  Follow - up plan: 2 weeks after d/c for wound check and repeat x-rays   Contact information:  Franky Light MD, Lauraine Moores PA-C. After hours and holidays please check [email or web address] for group call information for [facility]   Lauraine PATRIC Moores, PA-C [phone] (office) [email or web address]

## 2024-01-03 NOTE — Progress Notes (Signed)
 PT Cancellation Note  Patient Details Name: Molly Duffy MRN: [id number] DOB: [birth year]   Cancelled Treatment:    Reason Eval/Treat Not Completed: (P) Other (comment) Pt is eating, family ask PT to come back. PT will follow back tomorrow.  Torrence Hammack B. Fleeta Lapidus PT, DPT Acute Rehabilitation Services Please use secure chat or  Call Office [phone]     Almarie B. Fleeta Lapidus PT, DPT Acute Rehabilitation Services Please use secure chat or  Call Office [phone]    Almarie KATHEE Fleeta [facility] 01/03/2024, 4:04 PM

## 2024-01-04 ENCOUNTER — Encounter (HOSPITAL_COMMUNITY): Payer: Self-pay | Admitting: Student

## 2024-01-04 ENCOUNTER — Inpatient Hospital Stay (HOSPITAL_COMMUNITY)

## 2024-01-04 DIAGNOSIS — Z151 Genetic susceptibility to epilepsy and neurodevelopmental disorders: Secondary | ICD-10-CM

## 2024-01-04 DIAGNOSIS — S7292XA Unspecified fracture of left femur, initial encounter for closed fracture: Secondary | ICD-10-CM | POA: Diagnosis not present

## 2024-01-04 DIAGNOSIS — G40834 Dravet syndrome, intractable, without status epilepticus: Secondary | ICD-10-CM | POA: Diagnosis not present

## 2024-01-04 DIAGNOSIS — M818 Other osteoporosis without current pathological fracture: Secondary | ICD-10-CM | POA: Diagnosis not present

## 2024-01-04 LAB — TYPE AND SCREEN
ABO/RH(D): O POS
Antibody Screen: NEGATIVE
Unit division: 0
Unit division: 0

## 2024-01-04 LAB — BASIC METABOLIC PANEL WITH GFR
Anion gap: 11 (ref 5–15)
BUN: 18 mg/dL (ref 6–20)
CO2: 27 mmol/L (ref 22–32)
Calcium: 8.1 mg/dL — ABNORMAL LOW (ref 8.9–10.3)
Chloride: 100 mmol/L (ref 98–111)
Creatinine, Ser: 0.47 mg/dL (ref 0.44–1.00)
GFR, Estimated: >60 mL/min (ref >60)
Glucose, Bld: 119 mg/dL — ABNORMAL HIGH (ref 70–99)
Potassium: 3.3 mmol/L — ABNORMAL LOW (ref 3.5–5.1)
Sodium: 138 mmol/L (ref 135–145)

## 2024-01-04 LAB — CBC WITH DIFFERENTIAL/PLATELET
Abs Immature Granulocytes: 0.07 K/uL (ref 0.00–0.07)
Basophils Absolute: 0 K/uL (ref 0.0–0.1)
Basophils Relative: 0 %
Eosinophils Absolute: 0 K/uL (ref 0.0–0.5)
Eosinophils Relative: 0 %
HCT: 25.8 % — ABNORMAL LOW (ref 36.0–46.0)
Hemoglobin: 8.6 g/dL — ABNORMAL LOW (ref 12.0–15.0)
Immature Granulocytes: 1 %
Lymphocytes Relative: 30 %
Lymphs Abs: 3.4 K/uL (ref 0.7–4.0)
MCH: 30.1 pg (ref 26.0–34.0)
MCHC: 33.3 g/dL (ref 30.0–36.0)
MCV: 90.2 fL (ref 80.0–100.0)
Monocytes Absolute: 1.4 K/uL — ABNORMAL HIGH (ref 0.1–1.0)
Monocytes Relative: 12 %
Neutro Abs: 6.3 K/uL (ref 1.7–7.7)
Neutrophils Relative %: 57 %
Platelets: 121 K/uL — ABNORMAL LOW (ref 150–400)
RBC: 2.86 MIL/uL — ABNORMAL LOW (ref 3.87–5.11)
RDW: 15.5 % (ref 11.5–15.5)
WBC: 11.2 K/uL — ABNORMAL HIGH (ref 4.0–10.5)
nRBC: 0 % (ref 0.0–0.2)

## 2024-01-04 LAB — BPAM RBC
Blood Product Expiration Date: 202509212359
Blood Product Expiration Date: 202509272359
ISSUE DATE / TIME: 202509170433
ISSUE DATE / TIME: 202509181129
Unit Type and Rh: 5100
Unit Type and Rh: 9500

## 2024-01-04 LAB — GLUCOSE, CAPILLARY: Glucose-Capillary: 91 mg/dL (ref 70–99)

## 2024-01-04 MED ORDER — POTASSIUM CHLORIDE 20 MEQ PO PACK
40.0000 meq | PACK | Freq: Once | ORAL | Status: AC
Start: 2024-01-04 — End: 2024-01-04
  Administered 2024-01-04: 40 meq via ORAL
  Filled 2024-01-04: qty 2

## 2024-01-04 MED ORDER — IOHEXOL 350 MG/ML SOLN
75.0000 mL | Freq: Once | INTRAVENOUS | Status: AC | PRN
Start: 2024-01-04 — End: 2024-01-04
  Administered 2024-01-04: 75 mL via INTRAVENOUS

## 2024-01-04 MED ORDER — HYDROCODONE-ACETAMINOPHEN 5-325 MG PO TABS: 1.0000 | ORAL_TABLET | ORAL | 0 refills | Status: AC | PRN

## 2024-01-04 MED ORDER — POLYETHYLENE GLYCOL 3350 17 G PO PACK: 17.0000 g | PACK | Freq: Every day | ORAL | Status: AC | PRN

## 2024-01-04 MED ORDER — CANNABIDIOL 100 MG/ML PO SOLN
600.0000 mg | Freq: Two times a day (BID) | ORAL | Status: DC
  Administered 2024-01-05 – 2024-01-07 (×4): 600 mg via ORAL
  Filled 2024-01-04 (×3): qty 6

## 2024-01-04 MED ORDER — ASPIRIN 325 MG PO TABS: 325.0000 mg | ORAL_TABLET | Freq: Every day | ORAL | 0 refills | Status: AC

## 2024-01-04 MED ORDER — ACETAMINOPHEN 325 MG PO TABS: 325.0000 mg | ORAL_TABLET | Freq: Four times a day (QID) | ORAL | Status: AC | PRN

## 2024-01-04 NOTE — Care Management Important Message (Signed)
 Important Message  Patient Details  Name: Molly Duffy MRN: [id number] Date of Birth: [birth year]   Important Message Given:  Yes - Medicare IM     Jon Cruel 01/04/2024, 3:43 PM

## 2024-01-04 NOTE — Progress Notes (Signed)
 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 She underwent left femur open reduction and internal fixation on 9/17. Orthopedics on board. PT/OT eval after surgery pending.    Assessment/Plan:   Acute Comminuted left femoral fracture,POA: CT from 9/14 noted comminuted fracture of the distal femoral diaphysis and metaphysis  s/p  left femur open reduction and internal fixation done on 9/17. -prn analgesics -PT/OT   Acute Blood Loss Anemia CT from 9/14 showed intramuscular and deep fascial plane hemorrhage surrounding the fractures Transfused one unit of PRBC on 9/17 Additional unit of pRBC 9/18 Continue to trend    Left Labial Swelling Bruising to LLQ and Left Leg -CT abdomen/pelvis shows edema, no other significant abnormality  Thickening of sigmoid colon -Irregular thickening of sigmoid colon seen on CT abdomen/pelvis - Patient is supposed to follow-up with GI at [facility] on Tuesday for colonoscopy   Leukocytosis,POA: frequent history of UTIs-cultures 50,000 E. 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 Continues Epidiolex  600 twice daily, Depakote  125 4 times daily fenfluramine  3 mL 3 times daily Zonegran  175 at bedtime   Hypokalemia -Potassium is 3.3, will replace potassium and follow BMP in am   Medications     sodium chloride    Intravenous Once   amoxicillin -clavulanate  1 tablet Oral Q12H   aspirin   325 mg Oral Daily   cannabidiol   600 mg Oral BID   Chlorhexidine  Gluconate Cloth  6 each Topical Daily   divalproex   125 mg Oral 4 times per day   docusate sodium   100 mg Oral BID   estradiol   0.1 g Vaginal Once per day on Monday Wednesday Friday   Fenfluramine  HCl  3 mL Oral TID   [START ON 01/05/2024] ferrous sulfate   325 mg Oral QODAY   methenamine   1 g Oral BID   polyethylene glycol  17 g Oral BID   senna-docusate  1 tablet Oral BID   zonisamide   100 mg Oral QHS   And   zonisamide   75 mg Oral QHS     Data Reviewed:   CBG:  Recent Labs  Lab 01/04/24 0810  GLUCAP 91    SpO2: 96 % O2 Flow Rate (L/min): 2 L/min    Vitals:   01/03/24 1700 01/03/24 2023 01/04/24 0242 01/04/24 0810  BP: (!) 114/58 104/80 (!) 118/51 125/61  Pulse: 100 (!) 105 99 93  Resp: 16  18 19   Temp: 98.5 F (36.9 C) 97.9 F (36.6 C) 97.7 F (36.5 C) 98.2 F (36.8 C)  TempSrc: Oral  Oral   SpO2: 97% 95% 100% 96%  Weight:      Height:  Data Reviewed:  Basic Metabolic Panel: Recent Labs  Lab 12/30/23 1525 12/31/23 0517 01/02/24 0254 01/03/24 0311 01/04/24 0253  NA 136 134* 137 137 138  K 3.7 3.5 3.4* 3.8 3.3*  CL 101 100 102 99 100  CO2 24 24 27 28 27   GLUCOSE 150* 163* 117* 128* 119*  BUN 17 18 8 14 18   CREATININE 0.56 0.66 0.57 0.73 0.47  CALCIUM 9.2 8.4* 8.2* 8.1* 8.1*    CBC: Recent Labs  Lab 12/30/23 1525 12/31/23 0517 01/02/24 0254 01/03/24 0311 01/04/24 0253  WBC 11.6* 11.9* 12.7* 13.8* 11.2*  NEUTROABS  --   --  6.0 8.8* 6.3  HGB 10.4* 8.6* 6.3* 6.8* 8.6*  HCT  33.4* 26.9* 19.9* 20.8* 25.8*  MCV 92.8 92.8 93.4 91.6 90.2  PLT 113* 116* 100* 95* 121*    LFT Recent Labs  Lab 12/31/23 0517  AST 26  ALT 28  ALKPHOS 60  BILITOT 0.3  PROT 6.5  ALBUMIN 2.8*     Antibiotics: Anti-infectives (From admission, onward)    Start     Dose/Rate Route Frequency Ordered Stop   01/02/24 1330  amoxicillin -clavulanate (AUGMENTIN ) 875-125 MG per tablet 1 tablet        1 tablet Oral Every 12 hours 01/02/24 1232 01/05/24 0959   01/02/24 0911  vancomycin  (VANCOCIN ) powder  Status:  Discontinued          As needed 01/02/24 0912 01/02/24 1006   01/02/24 0745  ceFAZolin  (ANCEF ) IVPB 2g/100 mL premix        2 g 200 mL/hr over 30 Minutes Intravenous On call to O.R. 01/02/24 0731 01/02/24 0853   12/31/23 1230  cefTRIAXone  (ROCEPHIN ) 1 g in sodium chloride  0.9 % 100 mL IVPB  Status:  Discontinued        1 g 200 mL/hr over 30 Minutes Intravenous Every 24 hours 12/31/23 1138 01/02/24 1232   12/31/23 1000  methenamine  (HIPREX ) tablet 1 g        1 g Oral 2 times daily 12/31/23 1007     12/30/23 2200  methenamine  (HIPREX ) tablet 1 g  Status:  Discontinued        1 g Oral 2 times daily 12/30/23 2055 12/30/23 2105   12/30/23 2200  methenamine  (MANDELAMINE) tablet 1,000 mg  Status:  Discontinued        1,000 mg Oral 2 times daily 12/30/23 2105 12/31/23 1007   12/30/23 1915  cefTRIAXone  (ROCEPHIN ) 1 g in sodium chloride  0.9 % 100 mL IVPB        1 g 200 mL/hr over 30 Minutes Intravenous  Once 12/30/23 1909 12/31/23 0016        DVT prophylaxis: SCDs  Code Status: Full code  Family Communication: Discussed with mother and father at bedside   CONSULTS    Subjective   Patient fell on left side and has a bruise involving the left lower anterior abdominal wall.  CT abdomen/pelvis ordered   Objective    Physical Examination:   Appears in no acute distress, nonverbal S1-S2, regular, no murmur auscultated Abdomen is soft, nontender, no  organomegaly Skin-ecchymosis involving the lower left anterior abdominal wall Lungs are clear to auscultation bilaterally Extremities no edema  Status is: Inpatient:             Molly Duffy   [facility] If 7PM-7AM, please contact night-coverage at [email or web address], Office  [phone]   01/04/2024, 9:59 AM  LOS: 5 days

## 2024-01-04 NOTE — Progress Notes (Signed)
 This nurse witnessed the patient's primary nurse wasting the medicine Cannabidiol . The process of wasting and witnessing was followed as directed by the pharmacy as the medicine was home medicine dispensed by pharmacy with secure code.

## 2024-01-04 NOTE — Evaluation (Signed)
 Physical Therapy Re-Evaluation Patient Details Name: Molly Duffy MRN: [id number] DOB: 08/30/1970 Today's Date: 01/04/2024  History of Present Illness  Molly Duffy is a 53 y.o. female admitted 9/14 after fall at [employer] resulting in left distal femur fx that initially parents were going to choose non-operative management. Pt ultimately underwent ORIF on 9/17. 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   Clinical Impression  Pt admitted with above diagnosis. PTA, pt required assist with functional mobility, majority of ADLs, and all IADls. She ambulates using HHA with minA or uses w/c for mobility. Pt has a hx of multiple falls. Pt currently with functional limitations due to the deficits listed below (see PT Problem List). She required maxA x2 for bed mobility and totalA x2 fro lateral transfer to drop arm recliner chair. Pt attempted to stand twice from bed with 2 HHA, but was unable to clear her hips. Pt will benefit from acute skilled PT to maximize her independence and safety with mobility to allow discharge. Recommend HHPT to decrease caregiver burden, improve balance, decrease fall risk, and optimize safety within the home environment.      If plan is discharge home, recommend the following: Two people to help with walking and/or transfers;Two people to help with bathing/dressing/bathroom;Assistance with cooking/housework;Assist for transportation;Help with stairs or ramp for entrance   Can travel by private vehicle        Equipment Recommendations [place] lift;Hospital bed  Recommendations for Other Services       Functional Status Assessment Patient has had a recent decline in their functional status and demonstrates the ability to make significant improvements in function in a reasonable and predictable amount of time.     Precautions / Restrictions Precautions Precautions: Fall Recall of Precautions/Restrictions:  Impaired Restrictions Weight Bearing Restrictions Per Provider Order: Yes LLE Weight Bearing Per Provider Order: Weight bearing as tolerated      Mobility  Bed Mobility Overal bed mobility: Needs Assistance Bed Mobility: Rolling, Supine to Sit, Sit to Supine Rolling: Max assist, +2 for physical assistance   Supine to sit: +2 for physical assistance, Max assist     General bed mobility comments: Pt greeted soile in bed. Rolled for pericare and linen change. Assist for LEs, to raise trunk, and advance hips to EOB.    Transfers Overall transfer level: Needs assistance Equipment used: 2 person hand held assist Transfers: Sit to/from Stand, Bed to chair/wheelchair/BSC Sit to Stand: +2 physical assistance, Total assist          Lateral/Scoot Transfers: +2 physical assistance, Total assist General transfer comment: Attempted to stand with drop support vest, pt unable to clear buttocks. Lateral transfer to drop arm recliner on right touching bed. Assist with bed pad under hips and support of her LLE.    Ambulation/Gait               General Gait Details: Unable  Stairs            Wheelchair Mobility     Tilt Bed    Modified Rankin (Stroke Patients Only)       Balance Overall balance assessment: Needs assistance Sitting-balance support: Bilateral upper extremity supported, Feet supported Sitting balance-Leahy Scale: Fair Sitting balance - Comments: maintains L LE in extension     Standing balance-Leahy Scale: Zero Standing balance comment: Pt unable to stand  Pertinent Vitals/Pain Pain Assessment Pain Assessment: Faces Faces Pain Scale: Hurts even more Pain Location: L LE Pain Descriptors / Indicators: Grimacing, Guarding, Discomfort, Moaning Pain Intervention(s): Premedicated before session, Monitored during session, Limited activity within patient's tolerance, Repositioned    Home Living Family/patient  expects to be discharged to:: Private residence Living Arrangements: Parent;Other relatives Available Help at Discharge: Family;Available 24 hours/day;Personal care attendant Type of Home: House Home Access: Ramped entrance;Other (comment) (portable ramp)       Home Layout: Two level;Able to live on main level with bedroom/bathroom Home Equipment: Wheelchair - manual;Transport chair;BSC/3in1;Grab bars - tub/shower;Hand held shower head;Other (comment) (drop support vest, swivel seat in bathtub)      Prior Function Prior Level of Function : Needs assist       Physical Assist : Mobility (physical);ADLs (physical) Mobility (physical): Bed mobility;Transfers;Gait ADLs (physical): Grooming;Bathing;Toileting;IADLs Mobility Comments: Parents report aiding pt with mobility. She ambulates with minA to guard with pt wearing drop arm vest and using HHA. She can propel herself in w/c, but family will often push her. She has a history of multiple falls with injury. ADLs Comments: Parents report aiding pt with most ADLs and all IADLs. She is total assist for bathing and perciare. Pt is able to help in dressing herself and can feed herself. Assist with toileting and hygiene.     Extremity/Trunk Assessment   Upper Extremity Assessment Upper Extremity Assessment: Defer to OT evaluation    Lower Extremity Assessment Lower Extremity Assessment: Generalized weakness;Difficult to assess due to impaired cognition;LLE deficits/detail LLE Deficits / Details: Pt POD 2 s/p ORIF. Limited hip  and knee AROM d/t pain. Grossly 3-/5 strength. LLE: Unable to fully assess due to pain LLE Sensation: decreased proprioception LLE Coordination: decreased gross motor       Communication   Communication Communication: Impaired Factors Affecting Communication: Reduced clarity of speech (mostly non verbal)    Cognition Arousal: Alert Behavior During Therapy: Anxious   PT - Cognitive impairments: History of  cognitive impairments, Problem solving, Safety/Judgement                         Following commands: Impaired Following commands impaired: Follows one step commands inconsistently     Cueing Cueing Techniques: Verbal cues, Gestural cues, Tactile cues     General Comments General comments (skin integrity, edema, etc.): hematoma on left abomen/groin    Exercises     Assessment/Plan    PT Assessment Patient needs continued PT services  PT Problem List Decreased balance;Decreased mobility;Decreased knowledge of use of DME;Decreased safety awareness;Decreased knowledge of precautions;Pain;Decreased range of motion;Decreased strength;Decreased activity tolerance;Decreased coordination       PT Treatment Interventions DME instruction;Functional mobility training;Therapeutic activities;Therapeutic exercise;Balance training;Patient/family education;Wheelchair mobility training    PT Goals (Current goals can be found in the Care Plan section)  Acute Rehab PT Goals Patient Stated Goal: Return Home PT Goal Formulation: With patient/family Time For Goal Achievement: 01/18/24 Potential to Achieve Goals: Good    Frequency Min 2X/week     Co-evaluation PT/OT/SLP Co-Evaluation/Treatment: Yes Reason for Co-Treatment: Complexity of the patient's impairments (multi-system involvement);Necessary to address cognition/behavior during functional activity;For patient/therapist safety;To address functional/ADL transfers PT goals addressed during session: Mobility/safety with mobility;Balance OT goals addressed during session: ADL's and self-care       AM-PAC PT 6 Clicks Mobility  Outcome Measure Help needed turning from your back to your side while in a flat bed without using bedrails?: Total Help needed moving from  lying on your back to sitting on the side of a flat bed without using bedrails?: Total Help needed moving to and from a bed to a chair (including a wheelchair)?:  Total Help needed standing up from a chair using your arms (e.g., wheelchair or bedside chair)?: Total Help needed to walk in hospital room?: Total Help needed climbing 3-5 steps with a railing? : Total 6 Click Score: 6    End of Session Equipment Utilized During Treatment: Other (comment) (drop arm support vest) Activity Tolerance: Patient limited by pain Patient left: in chair;with call bell/phone within reach;with family/visitor present Nurse Communication: Mobility status;Need for lift equipment PT Visit Diagnosis: Muscle weakness (generalized) (M62.81)    Time: 8781-8746 PT Time Calculation (min) (ACUTE ONLY): 35 min   Charges:   PT Evaluation $PT Re-evaluation: 1 Re-eval   PT General Charges $$ ACUTE PT VISIT: 1 Visit         Randall SAUNDERS, PT, DPT Acute Rehabilitation Services Office: [phone] Secure Chat Preferred  Molly Duffy 01/04/2024, 3:03 PM

## 2024-01-04 NOTE — Progress Notes (Signed)
 Family refused Cannabidiol  medication sent from pharmacy due to color. The patient's mother stated that the home medication is a tan color and the medication sent from pharmacy is clear with pink tint. Mother administered home dose at bedside while nurse present. This nurse called pharmacy to discuss the mothers  concerns and inquire about how to waste the med because it was sent from pharmacy and not pulled from the pyxis. Medication wasted with charge nurse.

## 2024-01-04 NOTE — Progress Notes (Signed)
 Physical Therapy Treatment Patient Details Name: Molly Duffy MRN: [id number] DOB: [birth year] Today's Date: 01/04/2024   History of Present Illness Molly Duffy is a 53 y.o. female admitted 9/14 after fall at [employer] resulting in left distal femur fx that initially parents were going to choose non-operative management. Pt ultimately underwent ORIF on 9/17. 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   PT Comments  RN requested PT's help with getting pt back to bed. She was seated in recliner with drop arm only on left side. Positioned chair touching bed with close proximity to [facility] for pt to be positioned well. Completed lateral scooting transfer using bed pad with totalA x2 and pt's dad supporting LLE d/t pain. Returned pt to supine using helicopter bed pad technique and totalA x2. Will continue to follow acutely and advance appropriately.    If plan is discharge home, recommend the following: Two people to help with walking and/or transfers;Two people to help with bathing/dressing/bathroom;Assistance with cooking/housework;Assist for transportation;Help with stairs or ramp for entrance   Can travel by private vehicle        Equipment Recommendations  [place] lift;Hospital bed    Recommendations for Other Services       Precautions / Restrictions Precautions Precautions: Fall Recall of Precautions/Restrictions: Impaired Restrictions Weight Bearing Restrictions Per Provider Order: Yes LLE Weight Bearing Per Provider Order: Weight bearing as tolerated     Mobility  Bed Mobility Overal bed mobility: Needs Assistance Bed Mobility: Sit to Supine Rolling: Max assist, +2 for physical assistance   Supine to sit: +2 for physical assistance, Max assist Sit to supine: Total assist, +2 for physical assistance   General bed mobility comments: Returning to bed. Assist to manage BLE and trunk. Helicopter turn using bed pad.     Transfers Overall transfer level: Needs assistance Equipment used: 2 person hand held assist Transfers: Bed to chair/wheelchair/BSC Sit to Stand: +2 physical assistance, Total assist          Lateral/Scoot Transfers: Total assist, +2 physical assistance General transfer comment: Pt transferred back to bed going to the left. Her recliner chair only has a drop arm on the left side. Assist via bed pad to slowly scoot pt incremently back to bed. Pt's dad held LLE throughout mobility d/t pain.    Ambulation/Gait               General Gait Details: Unable   Stairs             Wheelchair Mobility     Tilt Bed    Modified Rankin (Stroke Patients Only)       Balance Overall balance assessment: Needs assistance Sitting-balance support: Bilateral upper extremity supported, Feet supported Sitting balance-Leahy Scale: Fair Sitting balance - Comments: Pt sat statically in recliner chair with supervision. During transfer back to bed she held upright posture with cues, as she fatigued demonstrated posterior lean.     Standing balance-Leahy Scale: Zero Standing balance comment: Pt unable to stand                            Communication Communication Communication: Impaired Factors Affecting Communication: Reduced clarity of speech  Cognition Arousal: Alert Behavior During Therapy: Anxious   PT - Cognitive impairments: History of cognitive impairments, Problem solving, Safety/Judgement  Following commands: Impaired Following commands impaired: Follows one step commands inconsistently    Cueing Cueing Techniques: Verbal cues  Exercises      General Comments General comments (skin integrity, edema, etc.): hematoma on left abomen/groin      Pertinent Vitals/Pain Pain Assessment Pain Assessment: Faces Faces Pain Scale: Hurts even more Pain Location: LLE with movement Pain Descriptors / Indicators: Grimacing,  Guarding, Discomfort, Moaning Pain Intervention(s): Monitored during session, Limited activity within patient's tolerance, Repositioned    Home Living Family/patient expects to be discharged to:: Private residence Living Arrangements: Parent;Other relatives Available Help at Discharge: Family;Available 24 hours/day;Personal care attendant Type of Home: House Home Access: Ramped entrance;Other (comment) (portable ramp)       Home Layout: Two level;Able to live on main level with bedroom/bathroom Home Equipment: Wheelchair - manual;Transport chair;BSC/3in1;Grab bars - tub/shower;Hand held shower head;Other (comment) (drop support vest, swivel seat in bathtub)      Prior Function            PT Goals (current goals can now be found in the care plan section) Acute Rehab PT Goals Patient Stated Goal: Return Home PT Goal Formulation: With patient/family Time For Goal Achievement: 01/18/24 Potential to Achieve Goals: Good Progress towards PT goals: Progressing toward goals    Frequency    Min 2X/week      PT Plan      Co-evaluation PT/OT/SLP Co-Evaluation/Treatment: Yes Reason for Co-Treatment: Complexity of the patient's impairments (multi-system involvement);Necessary to address cognition/behavior during functional activity;For patient/therapist safety;To address functional/ADL transfers PT goals addressed during session: Mobility/safety with mobility;Balance OT goals addressed during session: ADL's and self-care      AM-PAC PT 6 Clicks Mobility   Outcome Measure  Help needed turning from your back to your side while in a flat bed without using bedrails?: Total Help needed moving from lying on your back to sitting on the side of a flat bed without using bedrails?: Total Help needed moving to and from a bed to a chair (including a wheelchair)?: Total Help needed standing up from a chair using your arms (e.g., wheelchair or bedside chair)?: Total Help needed to walk in  hospital room?: Total Help needed climbing 3-5 steps with a railing? : Total 6 Click Score: 6    End of Session Equipment Utilized During Treatment: Other (comment) (drop arm support vest) Activity Tolerance: Patient limited by pain Patient left: in bed;with call bell/phone within reach;with bed alarm set;with family/visitor present Nurse Communication: Mobility status;Need for lift equipment;Other (comment) (Pt had BM and needs to be cleaned; RN and NT on way to complete pericare) PT Visit Diagnosis: Muscle weakness (generalized) (M62.81)     Time: 8446-8394 PT Time Calculation (min) (ACUTE ONLY): 12 min  Charges:    $Therapeutic Activity: 8-22 mins PT General Charges $$ ACUTE PT VISIT: 1 Visit                     Randall SAUNDERS, PT, DPT Acute Rehabilitation Services Office: [phone] Secure Chat Preferred  Delon CHRISTELLA Callander 01/04/2024, 4:45 PM

## 2024-01-04 NOTE — Evaluation (Signed)
 Occupational Therapy Evaluation Patient Details Name: Molly Duffy MRN: [id number] DOB: 07/22/1970 Today's Date: 01/04/2024   History of Present Illness   Molly Duffy is a 53 y.o. female admitted 9/14 after fall at [employer] resulting in left distal femur fx that initially parents were going to choose non-operative management. Pt ultimately underwent ORIF on 9/18. 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     Clinical Impressions Pt typically ambulates with min assist or uses a w/c for mobility. She has a hx of multiple falls. She is typically able to self feed and dress herself. She is assisted for bathing, toileting and all IADLs. Pt presents with L LE pain and anxiety with movement. She needs +2 max assist for bed mobility and +2 total assist for lateral transfer from bed to drop arm recliner. She was unable to stand with 2 person assist this date. Her parents agree a hoyer lift will be necessary at home (they have used one in the past) and are considering a hospital bed. Will follow acutely.     If plan is discharge home, recommend the following:   Two people to help with walking and/or transfers     Functional Status Assessment   Patient has had a recent decline in their functional status and/or demonstrates limited ability to make significant improvements in function in a reasonable and predictable amount of time     Equipment Recommendations   [place] lift;Hospital bed     Recommendations for Other Services         Precautions/Restrictions   Precautions Precautions: Fall Restrictions Weight Bearing Restrictions Per Provider Order: Yes LLE Weight Bearing Per Provider Order: Weight bearing as tolerated     Mobility Bed Mobility Overal bed mobility: Needs Assistance Bed Mobility: Rolling, Supine to Sit, Sit to Supine Rolling: Max assist, +2 for physical assistance   Supine to sit: +2 for  physical assistance, Max assist     General bed mobility comments: rolled for pericare and linen change, assist for LEs, to raise trunk and advance hips to EOB    Transfers Overall transfer level: Needs assistance Equipment used: 2 person hand held assist Transfers: Sit to/from Stand, Bed to chair/wheelchair/BSC Sit to Stand: +2 physical assistance, Total assist          Lateral/Scoot Transfers: +2 physical assistance, Total assist General transfer comment: attempted to stand with drop support vest, pt unable to clear buttocks, lateral transfer to drop arm recliner with bed pad under hips and support of her L LE      Balance Overall balance assessment: Needs assistance Sitting-balance support: Bilateral upper extremity supported, Feet supported Sitting balance-Leahy Scale: Fair Sitting balance - Comments: maintains L LE in extension     Standing balance-Leahy Scale: Zero                             ADL either performed or assessed with clinical judgement   ADL Overall ADL's : Needs assistance/impaired Eating/Feeding: Minimal assistance;Sitting                     Lower Body Dressing Details (indicate cue type and reason): able to doff R sock at bed level     Toileting- Clothing Manipulation and Hygiene: Total assistance;+2 for physical assistance;Bed level       Functional mobility during ADLs: +2 for physical assistance;Total assistance General ADL Comments: Pt demonstrating ability  to use her tablet once seated in her chair.     Vision Ability to See in Adequate Light: 0 Adequate Patient Visual Report: No change from baseline       Perception         Praxis         Pertinent Vitals/Pain Pain Assessment Pain Assessment: Faces Faces Pain Scale: Hurts even more Pain Location: L LE Pain Descriptors / Indicators: Grimacing, Guarding, Discomfort, Moaning Pain Intervention(s): Monitored during session, Repositioned     Extremity/Trunk  Assessment Upper Extremity Assessment Upper Extremity Assessment: Overall WFL for tasks assessed           Communication Communication Communication: Impaired Factors Affecting Communication: Reduced clarity of speech (mostly non verbal)   Cognition Arousal: Alert Behavior During Therapy: Anxious Cognition: History of cognitive impairments                               Following commands: Impaired Following commands impaired: Follows one step commands inconsistently     Cueing  General Comments   Cueing Techniques: Verbal cues;Gestural cues;Tactile cues      Exercises     Shoulder Instructions      Home Living Family/patient expects to be discharged to:: Private residence Living Arrangements: Parent;Other relatives Available Help at Discharge: Family;Available 24 hours/day;Personal care attendant Type of Home: House Home Access: Ramped entrance;Other (comment) (portable ramp)     Home Layout: Two level;Able to live on main level with bedroom/bathroom     Bathroom Shower/Tub: Chief Strategy Officer: Handicapped height     Home Equipment: Wheelchair - manual;Transport chair;BSC/3in1;Grab bars - tub/shower;Hand held shower head;Other (comment) (drop support vest, swivel seat in bathtub)          Prior Functioning/Environment Prior Level of Function : Needs assist             Mobility Comments: used wheelchair, min assist to guard pt with ambulation ADLs Comments: Bathing total assist but dresses self, feeds herself, assisted for toileting    OT Problem List: Decreased strength;Pain;Decreased cognition;Decreased knowledge of use of DME or AE;Impaired balance (sitting and/or standing)   OT Treatment/Interventions: Self-care/ADL training      OT Goals(Current goals can be found in the care plan section)   Acute Rehab OT Goals OT Goal Formulation: With family Time For Goal Achievement: 01/18/24 Potential to Achieve Goals:  Fair ADL Goals Pt Will Perform Grooming: with min assist;sitting Pt Will Perform Upper Body Dressing: with min assist;sitting Pt Will Transfer to Toilet: with mod assist;stand pivot transfer;bedside commode Additional ADL Goal #1: Pt will perform bed mobility with moderate assistance in preparation for ADLs.   OT Frequency:  Min 2X/week    Co-evaluation PT/OT/SLP Co-Evaluation/Treatment: Yes Reason for Co-Treatment: Complexity of the patient's impairments (multi-system involvement);Necessary to address cognition/behavior during functional activity   OT goals addressed during session: ADL's and self-care      AM-PAC OT 6 Clicks Daily Activity     Outcome Measure Help from another person eating meals?: A Little Help from another person taking care of personal grooming?: A Lot Help from another person toileting, which includes using toliet, bedpan, or urinal?: Total Help from another person bathing (including washing, rinsing, drying)?: Total Help from another person to put on and taking off regular upper body clothing?: A Little Help from another person to put on and taking off regular lower body clothing?: A Lot 6 Click Score: 12  End of Session Nurse Communication: Mobility status  Activity Tolerance: Patient limited by pain Patient left: in chair;with call bell/phone within reach;with family/visitor present  OT Visit Diagnosis: Unsteadiness on feet (R26.81);Pain;Other symptoms and signs involving cognitive function                Time: 8780-8746 OT Time Calculation (min): 34 min Charges:  OT General Charges $OT Visit: 1 Visit OT Evaluation $OT Eval Moderate Complexity: 1 Mod  Mliss HERO, OTR/L Acute Rehabilitation Services Office: [phone]   Kennth Mliss Helling 01/04/2024, 2:38 PM

## 2024-01-04 NOTE — Progress Notes (Signed)
 PT Cancellation Note  Patient Details Name: MAILYN STEICHEN MRN: [id number] DOB: 1971-01-14   Cancelled Treatment:    Reason Eval/Treat Not Completed: Patient at procedure or test/unavailable (Transport present in pt's room to take her to CT. Will follow-up for PT as schedule permits.)  Randall SAUNDERS, PT, DPT Acute Rehabilitation Services Office: [phone] Secure Chat Preferred  Delon CHRISTELLA Callander 01/04/2024, 11:42 AM

## 2024-01-04 NOTE — Progress Notes (Addendum)
 Orthopaedic Trauma Progress Note  SUBJECTIVE: Patient resting in bed comfortably this morning, has blanket pulled over her head.  Patient transitioned to 5N yesterday afternoon, mom notes they have had a better experience here so far.  Mom's main concern today is significant bruising/swelling to patient's left labia and groin.  Notes that this bruising/swelling was not present prior to surgery.  It was most swollen yesterday but does appear to have improved some today.  I reviewed surgical positioning, draping, and equipment used during procedure with the patient's mom.  I thought that his this may be related to her fall and has had delayed presentation.  CT abdomen/pelvis has been ordered to be completed this morning. Patient had multiple loose BM yesterday evening/overnight.  Will hold MiraLAX  dose today.   Patient's parents are at bedside.  They continue to have issues with medication (Epidiolex ) that they have provided from home not being sent up to the floor from pharmacy.  This has happened again this morning.  Nursing aware and taking care of this now.  OBJECTIVE:  Vitals:   01/04/24 0242 01/04/24 0810  BP: (!) 118/51 125/61  Pulse: 99 93  Resp: 18 19  Temp: 97.7 F (36.5 C) 98.2 F (36.8 C)  SpO2: 100% 96%    Opiates Today (MME): Today's  total administered Morphine  Milligram Equivalents: 15 Opiates Yesterday (MME): Yesterday's total administered Morphine  Milligram Equivalents: 25  General: Resting in bed comfortably, no acute distress Respiratory: No increased work of breathing.  Operative Extremity (left lower extremity): Dressings removed, incisions are clean, dry, intact.  New Mepilex applied over incisions.  No significant tenderness with palpation throughout the lower leg, ankle, foot.  No significant calf tenderness.  Discomfort with palpation over the distal thigh as expected.  Tolerates gentle ankle range of motion.  Able to wiggle the toes.  Patient tolerates very little motion  of the knee secondary to pain and anxiety.  Toes are warm and well-perfused. + DP pulse  IMAGING: Stable post op imaging left knee/femur  LABS:  Results for orders placed or performed during the hospital encounter of 12/30/23 (from the past 24 hours)  Basic metabolic panel with GFR     Status: Abnormal   Collection Time: 01/04/24  2:53 AM  Result Value Ref Range   Sodium 138 135 - 145 mmol/L   Potassium 3.3 (L) 3.5 - 5.1 mmol/L   Chloride 100 98 - 111 mmol/L   CO2 27 22 - 32 mmol/L   Glucose, Bld 119 (H) 70 - 99 mg/dL   BUN 18 6 - 20 mg/dL   Creatinine, Ser 9.52 0.44 - 1.00 mg/dL   Calcium 8.1 (L) 8.9 - 10.3 mg/dL   GFR, Estimated >39 >39 mL/min   Anion gap 11 5 - 15  CBC with Differential/Platelet     Status: Abnormal   Collection Time: 01/04/24  2:53 AM  Result Value Ref Range   WBC 11.2 (H) 4.0 - 10.5 K/uL   RBC 2.86 (L) 3.87 - 5.11 MIL/uL   Hemoglobin 8.6 (L) 12.0 - 15.0 g/dL   HCT 74.1 (L) 63.9 - 53.9 %   MCV 90.2 80.0 - 100.0 fL   MCH 30.1 26.0 - 34.0 pg   MCHC 33.3 30.0 - 36.0 g/dL   RDW 84.4 88.4 - 84.4 %   Platelets 121 (L) 150 - 400 K/uL   nRBC 0.0 0.0 - 0.2 %   Neutrophils Relative % 57 %   Neutro Abs 6.3 1.7 - 7.7 K/uL  Lymphocytes Relative 30 %   Lymphs Abs 3.4 0.7 - 4.0 K/uL   Monocytes Relative 12 %   Monocytes Absolute 1.4 (H) 0.1 - 1.0 K/uL   Eosinophils Relative 0 %   Eosinophils Absolute 0.0 0.0 - 0.5 K/uL   Basophils Relative 0 %   Basophils Absolute 0.0 0.0 - 0.1 K/uL   Immature Granulocytes 1 %   Abs Immature Granulocytes 0.07 0.00 - 0.07 K/uL  Glucose, capillary     Status: None   Collection Time: 01/04/24  8:10 AM  Result Value Ref Range   Glucose-Capillary 91 70 - 99 mg/dL   Comment 1 Notify RN    Comment 2 Document in Chart     ASSESSMENT: Molly Duffy is a 53 y.o. female, 2 Days Post-Op s/p fall Procedures: OPEN REDUCTION INTERNAL FIXATION LEFT DISTAL FEMUR FRACTURE  CV/Blood loss: Acute blood loss anemia, Hgb 8.6 this AM.   Received 1 unit PRBCs perioperatively 01/02/2024, and additional unit on 01/03/24. Remains hemodynamically stable.  PLAN: Weightbearing: WBAT LLE ROM: Unrestricted ROM.  No immobilization needed Incisional and dressing care: Change dressings PRN. Ok to leave open to air if no drainage Showering: Okay to begin getting incisions wet starting 01/05/2024 Orthopedic device(s): None  Pain management:  1. Tylenol  650 mg q 6 hours PRN 2. Norco 5-325 mg q 4 hours PRN 3. Morphine  1 mg q 2 hours PRN VTE prophylaxis: Aspirin , SCDs ID: Perioperative ABX completed.  Was previously on ceftriaxone  for UTI, switched to Augmentin  01/02/2024 Foley/Lines:  No foley, KVO IVFs Impediments to Fracture Healing: Vitamin D  level 85,no supplementation needed Dispo: PT/OT evaluation ongoing. F/u on CT abdomen/pelvis. Ok for d/c from ortho standpoint  D/C recommendations: - Hydrocodone , Tylenol  for pain control - Aspirin  to 125 mg daily x 30 days for DVT prophylaxis - No additional need for Vit D supplementation  Follow - up plan: 2 weeks after d/c for wound check and repeat x-rays   Contact information:  Franky Light MD, Lauraine Moores PA-C. After hours and holidays please check [email or web address] for group call information for [facility]   Lauraine PATRIC Moores, PA-C [phone] (office) [email or web address]

## 2024-01-05 DIAGNOSIS — S7292XA Unspecified fracture of left femur, initial encounter for closed fracture: Secondary | ICD-10-CM | POA: Diagnosis not present

## 2024-01-05 LAB — CBC WITH DIFFERENTIAL/PLATELET
Abs Immature Granulocytes: 0.03 K/uL (ref 0.00–0.07)
Basophils Absolute: 0 K/uL (ref 0.0–0.1)
Basophils Relative: 0 %
Eosinophils Absolute: 0.1 K/uL (ref 0.0–0.5)
Eosinophils Relative: 1 %
HCT: 24.4 % — ABNORMAL LOW (ref 36.0–46.0)
Hemoglobin: 7.8 g/dL — ABNORMAL LOW (ref 12.0–15.0)
Immature Granulocytes: 0 %
Lymphocytes Relative: 32 %
Lymphs Abs: 2.7 K/uL (ref 0.7–4.0)
MCH: 29.7 pg (ref 26.0–34.0)
MCHC: 32 g/dL (ref 30.0–36.0)
MCV: 92.8 fL (ref 80.0–100.0)
Monocytes Absolute: 1.2 K/uL — ABNORMAL HIGH (ref 0.1–1.0)
Monocytes Relative: 15 %
Neutro Abs: 4.3 K/uL (ref 1.7–7.7)
Neutrophils Relative %: 52 %
Platelets: 141 K/uL — ABNORMAL LOW (ref 150–400)
RBC: 2.63 MIL/uL — ABNORMAL LOW (ref 3.87–5.11)
RDW: 14.7 % (ref 11.5–15.5)
WBC: 8.3 K/uL (ref 4.0–10.5)
nRBC: 0 % (ref 0.0–0.2)

## 2024-01-05 LAB — BASIC METABOLIC PANEL WITH GFR
Anion gap: 8 (ref 5–15)
BUN: 19 mg/dL (ref 6–20)
CO2: 26 mmol/L (ref 22–32)
Calcium: 8 mg/dL — ABNORMAL LOW (ref 8.9–10.3)
Chloride: 103 mmol/L (ref 98–111)
Creatinine, Ser: 0.57 mg/dL (ref 0.44–1.00)
GFR, Estimated: >60 mL/min (ref >60)
Glucose, Bld: 124 mg/dL — ABNORMAL HIGH (ref 70–99)
Potassium: 3.7 mmol/L (ref 3.5–5.1)
Sodium: 137 mmol/L (ref 135–145)

## 2024-01-05 NOTE — Progress Notes (Signed)
 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 She underwent left femur open reduction and internal fixation on 9/17. Orthopedics on board. PT/OT eval after surgery pending.  Assessment & Plan:  Principal Problem:   Femur fracture, left (HCC)   Acute Comminuted left femoral fracture,POA: CT from 9/14 noted comminuted fracture of the distal femoral diaphysis and metaphysis  s/p  left femur open reduction and internal fixation done on 9/17. -prn analgesics - still issues with pain, will discuss with RN -PT/OT  Acute Blood Loss Anemia CT from 9/14 showed intramuscular and deep fascial plane hemorrhage surrounding the fractures S/p 2 units pRBC Hb 7.8 today Continue to trend   Left Labial Swelling Bruising to LLQ and Left Leg Improving per discussion with mother CT with soft tissue swelling and deman surrounding the L hip and lower anterior L abdominal wall   Leukocytosis,POA: frequent history of UTIs-cultures 50,000 E. 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 Lungs: unlabored Neurological: awake, nonverbal today Extremities: LLE edema, dressing intact    Diet Orders (From admission, onward)     Start     Ordered   01/02/24 1156  Diet regular Room service appropriate? Yes; Fluid consistency: Thin  Diet effective now       Question Answer Comment  Room service appropriate? Yes   Fluid consistency: Thin      01/02/24 1155            Objective: Vitals:   01/04/24 0810 01/04/24 1951 01/05/24 0749 01/05/24 1445  BP: 125/61 (!) 131/93 (!) 107/59 (!) 118/56  Pulse: 93 (!) 101 89 89  Resp: 19 17 19 18   Temp: 98.2 F (36.8 C) 97.8 F (36.6 C) 98.3 F (36.8 C) 98 F (36.7 C)  TempSrc:   Oral Oral  SpO2: 96% 97% 98% 100%  Weight:      Height:       No intake or output data in the 24 hours ending 01/05/24 1553  Filed Weights   12/30/23 1314 01/02/24 0713  Weight: 69.9 kg 69.9 kg    Scheduled Meds:  sodium chloride    Intravenous Once   aspirin   325 mg  Oral Daily   cannabidiol   600 mg Oral BID   Chlorhexidine  Gluconate Cloth  6 each Topical Daily   divalproex   125 mg Oral 4 times per day   docusate sodium   100 mg Oral BID   estradiol   0.1 g Vaginal Once per day on Monday Wednesday Friday   Fenfluramine  HCl  3 mL Oral TID   ferrous sulfate   325 mg Oral QODAY   methenamine   1 g Oral BID   senna-docusate  1 tablet Oral BID   zonisamide   100 mg  Oral QHS   And   zonisamide   75 mg Oral QHS   Continuous Infusions:    Nutritional status     Body mass index is 24.86 kg/m.  Data Reviewed:   CBC: Recent Labs  Lab 12/31/23 0517 01/02/24 0254 01/03/24 0311 01/04/24 0253 01/05/24 0330  WBC 11.9* 12.7* 13.8* 11.2* 8.3  NEUTROABS  --  6.0 8.8* 6.3 4.3  HGB 8.6* 6.3* 6.8* 8.6* 7.8*  HCT 26.9* 19.9* 20.8* 25.8* 24.4*  MCV 92.8 93.4 91.6 90.2 92.8  PLT 116* 100* 95* 121* 141*   Basic Metabolic Panel: Recent Labs  Lab 12/31/23 0517 01/02/24 0254 01/03/24 0311 01/04/24 0253 01/05/24 0330  NA 134* 137 137 138 137  K 3.5 3.4* 3.8 3.3* 3.7  CL 100 102 99 100 103  CO2 24 27 28 27 26   GLUCOSE 163* 117* 128* 119* 124*  BUN 18 8 14 18 19   CREATININE 0.66 0.57 0.73 0.47 0.57  CALCIUM 8.4* 8.2* 8.1* 8.1* 8.0*   GFR: Estimated Creatinine Clearance: 77 mL/min (by C-G formula based on SCr of 0.57 mg/dL). Liver Function Tests: Recent Labs  Lab 12/31/23 0517  AST 26  ALT 28  ALKPHOS 60  BILITOT 0.3  PROT 6.5  ALBUMIN 2.8*   No results for input(s): LIPASE, AMYLASE in the last 168 hours. No results for input(s): AMMONIA in the last 168 hours. Coagulation Profile: Recent Labs  Lab 12/31/23 0517  INR 1.1   Cardiac Enzymes: No results for input(s): CKTOTAL, CKMB, CKMBINDEX, TROPONINI in the last 168 hours. BNP (last 3 results) No results for input(s): PROBNP in the last 8760 hours. HbA1C: No results for input(s): HGBA1C in the last 72 hours. CBG: Recent Labs  Lab 01/04/24 0810  GLUCAP 91   Lipid Profile: No results for input(s): CHOL, HDL, LDLCALC, TRIG, CHOLHDL, LDLDIRECT in the last 72 hours. Thyroid  Function Tests: No results for input(s): TSH, T4TOTAL, FREET4, T3FREE, THYROIDAB in the last 72 hours. Anemia Panel: No results for input(s): VITAMINB12, FOLATE, FERRITIN, TIBC, IRON, RETICCTPCT in the last 72 hours.  Sepsis Labs: No results for  input(s): PROCALCITON, LATICACIDVEN in the last 168 hours.  Recent Results (from the past 240 hours)  Urine Culture     Status: Abnormal   Collection Time: 12/30/23  5:16 PM   Specimen: Urine, Random  Result Value Ref Range Status   Specimen Description   Final    URINE, RANDOM Performed at [facility], [address], [place], [place] [address]    Special Requests   Final    NONE Reflexed from [id number] Performed at [facility], [address], [place], [place] [address]    Culture 50,000 COLONIES/mL ESCHERICHIA COLI (Jkai Arwood)  Final   Report Status 01/01/2024 FINAL  Final   Organism ID, Bacteria ESCHERICHIA COLI (Evangelynn Lochridge)  Final      Susceptibility   Escherichia coli - MIC*    AMPICILLIN <=2 SENSITIVE  Sensitive     CEFAZOLIN  (URINE) Value in next row Sensitive      <=1 SENSITIVEThis is Kriya Westra modified FDA-approved test that has been validated and its performance characteristics determined by the reporting laboratory.  This laboratory is certified under the Clinical Laboratory Improvement Amendments CLIA as qualified to perform high complexity clinical laboratory testing.    CEFEPIME Value in next row Sensitive      <=1 SENSITIVEThis is Shaynna Husby modified FDA-approved test that has been validated and its performance characteristics determined by the reporting laboratory.  This laboratory is certified under the Clinical Laboratory Improvement Amendments CLIA as qualified to perform high complexity clinical laboratory testing.    ERTAPENEM Value in next row Sensitive      <=1 SENSITIVEThis is Aneliese Beaudry modified FDA-approved test that has been validated and its performance characteristics determined by the reporting laboratory.  This laboratory is certified under the Clinical Laboratory Improvement Amendments CLIA as qualified to perform high complexity clinical laboratory testing.    CEFTRIAXONE  Value in next row Sensitive      <=1 SENSITIVEThis is Shley Dolby modified FDA-approved test that  has been validated and its performance characteristics determined by the reporting laboratory.  This laboratory is certified under the Clinical Laboratory Improvement Amendments CLIA as qualified to perform high complexity clinical laboratory testing.    CIPROFLOXACIN  Value in next row Intermediate      <=1 SENSITIVEThis is Valery Chance modified FDA-approved test that has been validated and its performance characteristics determined by the reporting laboratory.  This laboratory is certified under the Clinical Laboratory Improvement Amendments CLIA as qualified to perform high complexity clinical laboratory testing.    GENTAMICIN Value in next row Sensitive      <=1 SENSITIVEThis is Emry Barbato modified FDA-approved test that has been validated and its performance characteristics determined by the reporting laboratory.  This laboratory is certified under the Clinical Laboratory Improvement Amendments CLIA as qualified to perform high complexity clinical laboratory testing.    NITROFURANTOIN Value in next row Sensitive      <=1 SENSITIVEThis is Ulas Zuercher modified FDA-approved test that has been validated and its performance characteristics determined by the reporting laboratory.  This laboratory is certified under the Clinical Laboratory Improvement Amendments CLIA as qualified to perform high complexity clinical laboratory testing.    TRIMETH/SULFA Value in next row Sensitive      <=1 SENSITIVEThis is Charice Zuno modified FDA-approved test that has been validated and its performance characteristics determined by the reporting laboratory.  This laboratory is certified under the Clinical Laboratory Improvement Amendments CLIA as qualified to perform high complexity clinical laboratory testing.    AMPICILLIN/SULBACTAM Value in next row Sensitive      <=1 SENSITIVEThis is Zalmen Wrightsman modified FDA-approved test that has been validated and its performance characteristics determined by the reporting laboratory.  This laboratory is certified under the Clinical  Laboratory Improvement Amendments CLIA as qualified to perform high complexity clinical laboratory testing.    PIP/TAZO Value in next row Sensitive ug/mL     <=4 SENSITIVEThis is Harshan Kearley modified FDA-approved test that has been validated and its performance characteristics determined by the reporting laboratory.  This laboratory is certified under the Clinical Laboratory Improvement Amendments CLIA as qualified to perform high complexity clinical laboratory testing.    MEROPENEM Value in next row Sensitive      <=4 SENSITIVEThis is River Ambrosio modified FDA-approved test that has been validated and its performance characteristics determined by the reporting laboratory.  This laboratory is certified under the Clinical Laboratory Improvement Amendments  CLIA as qualified to perform high complexity clinical laboratory testing.    * 50,000 COLONIES/mL ESCHERICHIA COLI         Radiology Studies: CT ABDOMEN PELVIS W CONTRAST Result Date: 01/04/2024 CLINICAL DATA:  Fall with abdominal bruising. EXAM: CT ABDOMEN AND PELVIS WITH CONTRAST TECHNIQUE: Multidetector CT imaging of the abdomen and pelvis was performed using the standard protocol following bolus administration of intravenous contrast. RADIATION DOSE REDUCTION: This exam was performed according to the departmental dose-optimization program which includes automated exposure control, adjustment of the mA and/or kV according to patient size and/or use of iterative reconstruction technique. CONTRAST:  75mL OMNIPAQUE  IOHEXOL  350 MG/ML SOLN COMPARISON:  CT pelvis 06/11/2019 FINDINGS: Lower chest: No acute abnormality. Hepatobiliary: No focal liver abnormality is seen. No gallstones, gallbladder wall thickening, or biliary dilatation. Pancreas: Unremarkable. No pancreatic ductal dilatation or surrounding inflammatory changes. Spleen: Normal in size without focal abnormality. Adrenals/Urinary Tract: There are punctate nonobstructing bilateral renal calculi. Otherwise, the  kidneys, adrenal glands and bladder are within normal limits. Stomach/Bowel: There is questionable irregular circumferential focal sigmoid colon wall thickening seen on image 3/64. There is no bowel obstruction, inflammatory stranding, pneumatosis or free air. The appendix and stomach are within normal limits. Vascular/Lymphatic: No significant vascular findings are present. No enlarged abdominal or pelvic lymph nodes. Reproductive: Uterus and bilateral adnexa are unremarkable. Other: No abdominal wall hernia or abnormality. No abdominopelvic ascites. There is no retroperitoneal hematoma. Musculoskeletal: There C fascial plane edema surrounding the left hip. There is also subcutaneous edema and swelling along the lower anterior left abdominal wall surrounding the left hip. There is also mild edema in the left labia majora. No acute fractures are seen. Healed sacral fracture present. IMPRESSION: 1. No acute posttraumatic sequelae in the abdomen or pelvis. 2. Soft tissue swelling and edema surrounding the left hip and lower anterior left abdominal wall. No acute fractures are seen. 3. Questionable irregular circumferential wall thickening of the sigmoid colon. Recommend correlation with colonoscopy to exclude underlying mass. 4. Nonobstructing bilateral renal calculi. Electronically Signed   By: Greig Pique M.D.   On: 01/04/2024 16:57         LOS: 6 days   Time spent= 41 mins    Meliton Monte, MD [facility]  If 7PM-7AM, please contact night-coverage  01/05/2024, 3:53 PM

## 2024-01-05 NOTE — Progress Notes (Signed)
 Mother administered home medication Cannabidiol  to patient at bedside while nurse present.

## 2024-01-05 NOTE — Progress Notes (Signed)
 Patient's mother told this nurse that she requested the home med Cannabidiol  be returned to her around 4pm. This nurse called pharmacy to request medication. Medication retrieved from pharmacy and given to patient's mother by primary nurse and charge nurse as a witness.

## 2024-01-05 NOTE — Plan of Care (Signed)

## 2024-01-06 DIAGNOSIS — S7292XA Unspecified fracture of left femur, initial encounter for closed fracture: Secondary | ICD-10-CM | POA: Diagnosis not present

## 2024-01-06 LAB — CBC WITH DIFFERENTIAL/PLATELET
Abs Immature Granulocytes: 0.06 K/uL (ref 0.00–0.07)
Basophils Absolute: 0 K/uL (ref 0.0–0.1)
Basophils Relative: 0 %
Eosinophils Absolute: 0.1 K/uL (ref 0.0–0.5)
Eosinophils Relative: 1 %
HCT: 24.8 % — ABNORMAL LOW (ref 36.0–46.0)
Hemoglobin: 7.9 g/dL — ABNORMAL LOW (ref 12.0–15.0)
Immature Granulocytes: 1 %
Lymphocytes Relative: 30 %
Lymphs Abs: 2.2 K/uL (ref 0.7–4.0)
MCH: 29.8 pg (ref 26.0–34.0)
MCHC: 31.9 g/dL (ref 30.0–36.0)
MCV: 93.6 fL (ref 80.0–100.0)
Monocytes Absolute: 1 K/uL (ref 0.1–1.0)
Monocytes Relative: 13 %
Neutro Abs: 4.1 K/uL (ref 1.7–7.7)
Neutrophils Relative %: 55 %
Platelets: 154 K/uL (ref 150–400)
RBC: 2.65 MIL/uL — ABNORMAL LOW (ref 3.87–5.11)
RDW: 14.5 % (ref 11.5–15.5)
WBC: 7.5 K/uL (ref 4.0–10.5)
nRBC: 0 % (ref 0.0–0.2)

## 2024-01-06 LAB — BASIC METABOLIC PANEL WITH GFR
Anion gap: 6 (ref 5–15)
BUN: 18 mg/dL (ref 6–20)
CO2: 25 mmol/L (ref 22–32)
Calcium: 8.1 mg/dL — ABNORMAL LOW (ref 8.9–10.3)
Chloride: 105 mmol/L (ref 98–111)
Creatinine, Ser: 0.6 mg/dL (ref 0.44–1.00)
GFR, Estimated: >60 mL/min (ref >60)
Glucose, Bld: 116 mg/dL — ABNORMAL HIGH (ref 70–99)
Potassium: 3.6 mmol/L (ref 3.5–5.1)
Sodium: 136 mmol/L (ref 135–145)

## 2024-01-06 MED ORDER — ACETAMINOPHEN 500 MG PO TABS
500.0000 mg | ORAL_TABLET | Freq: Three times a day (TID) | ORAL | Status: DC
  Administered 2024-01-06 – 2024-01-07 (×4): 500 mg via ORAL
  Filled 2024-01-06 (×5): qty 1

## 2024-01-06 NOTE — Progress Notes (Signed)
 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 She underwent left femur open reduction and internal fixation on 9/17. Orthopedics on board. PT/OT eval after surgery pending.  Assessment & Plan:  Principal Problem:   Femur fracture, left (HCC)   Acute Comminuted left femoral fracture,POA: CT from 9/14 noted comminuted fracture of the distal femoral diaphysis and metaphysis  s/p  left femur open reduction and internal fixation done on 9/17. -prn analgesics  -PT/OT  Acute Blood Loss Anemia CT from 9/14 showed intramuscular and deep fascial plane hemorrhage surrounding the fractures S/p 2 units pRBC Hb 7.9 today Continue to trend   Left Labial Swelling Bruising to LLQ and Left Leg Improving per discussion with mother CT with soft tissue swelling and deman surrounding the L hip and lower anterior L abdominal wall  Still has LLE swelling after surgery, elevate LLE  Leukocytosis,POA: frequent history of UTIs-cultures 50,000 E. 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 Cardiovascular: RRR Lungs: unlabored Abdomen: Soft, nontender, nondistended Neurological: Alert and oriented 3. Moves all extremities 4. Cranial nerves II through XII grossly intact. Extremities: lle swelling    Diet Orders (From admission, onward)     Start     Ordered   01/02/24 1156  Diet regular Room service appropriate? Yes; Fluid consistency: Thin  Diet effective now       Question Answer Comment  Room service appropriate? Yes   Fluid consistency: Thin      01/02/24 1155            Objective: Vitals:   01/05/24 0749 01/05/24 1445 01/05/24 1954 01/06/24 0220  BP: (!) 107/59 (!) 118/56 102/71 94/77  Pulse: 89 89 88 94  Resp: 19 18 17 17   Temp: 98.3 F (36.8 C) 98 F (36.7 C) 98.4 F (36.9 C) 98.5 F (36.9 C)  TempSrc: Oral Oral Oral Oral  SpO2: 98% 100% 100% 99%  Weight:      Height:        Intake/Output Summary (Last 24 hours) at 01/06/2024 9040 Last data filed at 01/05/2024 2000 Gross per 24 hour  Intake 240 ml  Output --  Net  240 ml    Filed Weights   12/30/23 1314 01/02/24 0713  Weight: 69.9 kg 69.9 kg    Scheduled Meds:  sodium chloride    Intravenous Once   acetaminophen   500 mg Oral Q8H   aspirin   325 mg Oral Daily   cannabidiol   600 mg Oral BID   Chlorhexidine  Gluconate Cloth  6 each Topical Daily   divalproex   125 mg Oral 4 times per day   docusate  sodium  100 mg Oral BID   estradiol   0.1 g Vaginal Once per day on Monday Wednesday Friday   Fenfluramine  HCl  3 mL Oral TID   ferrous sulfate   325 mg Oral QODAY   methenamine   1 g Oral BID   zonisamide   100 mg Oral QHS   And   zonisamide   75 mg Oral QHS   Continuous Infusions:    Nutritional status     Body mass index is 24.86 kg/m.  Data Reviewed:   CBC: Recent Labs  Lab 01/02/24 0254 01/03/24 0311 01/04/24 0253 01/05/24 0330 01/06/24 0411  WBC 12.7* 13.8* 11.2* 8.3 7.5  NEUTROABS 6.0 8.8* 6.3 4.3 4.1  HGB 6.3* 6.8* 8.6* 7.8* 7.9*  HCT 19.9* 20.8* 25.8* 24.4* 24.8*  MCV 93.4 91.6 90.2 92.8 93.6  PLT 100* 95* 121* 141* 154   Basic Metabolic Panel: Recent Labs  Lab 01/02/24 0254 01/03/24 0311 01/04/24 0253 01/05/24 0330 01/06/24 0411  NA 137 137 138 137 136  K 3.4* 3.8 3.3* 3.7 3.6  CL 102 99 100 103 105  CO2 27 28 27 26 25   GLUCOSE 117* 128* 119* 124* 116*  BUN 8 14 18 19 18   CREATININE 0.57 0.73 0.47 0.57 0.60  CALCIUM 8.2* 8.1* 8.1* 8.0* 8.1*   GFR: Estimated Creatinine Clearance: 77 mL/min (by C-G formula based on SCr of 0.6 mg/dL). Liver Function Tests: Recent Labs  Lab 12/31/23 0517  AST 26  ALT 28  ALKPHOS 60  BILITOT 0.3  PROT 6.5  ALBUMIN 2.8*   No results for input(s): LIPASE, AMYLASE in the last 168 hours. No results for input(s): AMMONIA in the last 168 hours. Coagulation Profile: Recent Labs  Lab 12/31/23 0517  INR 1.1   Cardiac Enzymes: No results for input(s): CKTOTAL, CKMB, CKMBINDEX, TROPONINI in the last 168 hours. BNP (last 3 results) No results for input(s): PROBNP in the last 8760 hours. HbA1C: No results for input(s): HGBA1C in the last 72 hours. CBG: Recent Labs  Lab 01/04/24 0810  GLUCAP 91   Lipid Profile: No results for input(s): CHOL, HDL, LDLCALC, TRIG, CHOLHDL, LDLDIRECT in the last 72 hours. Thyroid  Function Tests: No results for input(s): TSH, T4TOTAL, FREET4,  T3FREE, THYROIDAB in the last 72 hours. Anemia Panel: No results for input(s): VITAMINB12, FOLATE, FERRITIN, TIBC, IRON, RETICCTPCT in the last 72 hours.  Sepsis Labs: No results for input(s): PROCALCITON, LATICACIDVEN in the last 168 hours.  Recent Results (from the past 240 hours)  Urine Culture     Status: Abnormal   Collection Time: 12/30/23  5:16 PM   Specimen: Urine, Random  Result Value Ref Range Status   Specimen Description   Final    URINE, RANDOM Performed at [facility], [address] [place], [place] [address]    Special Requests   Final    NONE Reflexed from [phone] [id number] Performed at [facility], [address], [place], [place] [address]    Culture 50,000 COLONIES/mL ESCHERICHIA COLI (Nathanie Ottley)  Final  Report Status 01/01/2024 FINAL  Final   Organism ID, Bacteria ESCHERICHIA COLI (Minha Fulco)  Final      Susceptibility   Escherichia coli - MIC*    AMPICILLIN <=2 SENSITIVE Sensitive     CEFAZOLIN  (URINE) Value in next row Sensitive      <=1 SENSITIVEThis is Kimmberly Wisser modified FDA-approved test that has been validated and its performance characteristics determined by the reporting laboratory.  This laboratory is certified under the Clinical Laboratory Improvement Amendments CLIA as qualified to perform high complexity clinical laboratory testing.    CEFEPIME Value in next row Sensitive      <=1 SENSITIVEThis is Anola Mcgough modified FDA-approved test that has been validated and its performance characteristics determined by the reporting laboratory.  This laboratory is certified under the Clinical Laboratory Improvement Amendments CLIA as qualified to perform high complexity clinical laboratory testing.    ERTAPENEM Value in next row Sensitive      <=1 SENSITIVEThis is Corinthia Helmers modified FDA-approved test that has been validated and its performance characteristics determined by the reporting laboratory.  This laboratory is certified under the Clinical  Laboratory Improvement Amendments CLIA as qualified to perform high complexity clinical laboratory testing.    CEFTRIAXONE  Value in next row Sensitive      <=1 SENSITIVEThis is Melvina Pangelinan modified FDA-approved test that has been validated and its performance characteristics determined by the reporting laboratory.  This laboratory is certified under the Clinical Laboratory Improvement Amendments CLIA as qualified to perform high complexity clinical laboratory testing.    CIPROFLOXACIN  Value in next row Intermediate      <=1 SENSITIVEThis is Huan Pollok modified FDA-approved test that has been validated and its performance characteristics determined by the reporting laboratory.  This laboratory is certified under the Clinical Laboratory Improvement Amendments CLIA as qualified to perform high complexity clinical laboratory testing.    GENTAMICIN Value in next row Sensitive      <=1 SENSITIVEThis is Rhina Kramme modified FDA-approved test that has been validated and its performance characteristics determined by the reporting laboratory.  This laboratory is certified under the Clinical Laboratory Improvement Amendments CLIA as qualified to perform high complexity clinical laboratory testing.    NITROFURANTOIN Value in next row Sensitive      <=1 SENSITIVEThis is Somnang Mahan modified FDA-approved test that has been validated and its performance characteristics determined by the reporting laboratory.  This laboratory is certified under the Clinical Laboratory Improvement Amendments CLIA as qualified to perform high complexity clinical laboratory testing.    TRIMETH/SULFA Value in next row Sensitive      <=1 SENSITIVEThis is Sherman Lipuma modified FDA-approved test that has been validated and its performance characteristics determined by the reporting laboratory.  This laboratory is certified under the Clinical Laboratory Improvement Amendments CLIA as qualified to perform high complexity clinical laboratory testing.    AMPICILLIN/SULBACTAM Value in next row  Sensitive      <=1 SENSITIVEThis is Davaughn Hillyard modified FDA-approved test that has been validated and its performance characteristics determined by the reporting laboratory.  This laboratory is certified under the Clinical Laboratory Improvement Amendments CLIA as qualified to perform high complexity clinical laboratory testing.    PIP/TAZO Value in next row Sensitive ug/mL     <=4 SENSITIVEThis is Gilberto Streck modified FDA-approved test that has been validated and its performance characteristics determined by the reporting laboratory.  This laboratory is certified under the Clinical Laboratory Improvement Amendments CLIA as qualified to perform high complexity clinical laboratory testing.    MEROPENEM Value in next row Sensitive      <=  4 SENSITIVEThis is Solash Tullo modified FDA-approved test that has been validated and its performance characteristics determined by the reporting laboratory.  This laboratory is certified under the Clinical Laboratory Improvement Amendments CLIA as qualified to perform high complexity clinical laboratory testing.    * 50,000 COLONIES/mL ESCHERICHIA COLI         Radiology Studies: CT ABDOMEN PELVIS W CONTRAST Result Date: 01/04/2024 CLINICAL DATA:  Fall with abdominal bruising. EXAM: CT ABDOMEN AND PELVIS WITH CONTRAST TECHNIQUE: Multidetector CT imaging of the abdomen and pelvis was performed using the standard protocol following bolus administration of intravenous contrast. RADIATION DOSE REDUCTION: This exam was performed according to the departmental dose-optimization program which includes automated exposure control, adjustment of the mA and/or kV according to patient size and/or use of iterative reconstruction technique. CONTRAST:  75mL OMNIPAQUE  IOHEXOL  350 MG/ML SOLN COMPARISON:  CT pelvis 06/11/2019 FINDINGS: Lower chest: No acute abnormality. Hepatobiliary: No focal liver abnormality is seen. No gallstones, gallbladder wall thickening, or biliary dilatation. Pancreas: Unremarkable. No  pancreatic ductal dilatation or surrounding inflammatory changes. Spleen: Normal in size without focal abnormality. Adrenals/Urinary Tract: There are punctate nonobstructing bilateral renal calculi. Otherwise, the kidneys, adrenal glands and bladder are within normal limits. Stomach/Bowel: There is questionable irregular circumferential focal sigmoid colon wall thickening seen on image 3/64. There is no bowel obstruction, inflammatory stranding, pneumatosis or free air. The appendix and stomach are within normal limits. Vascular/Lymphatic: No significant vascular findings are present. No enlarged abdominal or pelvic lymph nodes. Reproductive: Uterus and bilateral adnexa are unremarkable. Other: No abdominal wall hernia or abnormality. No abdominopelvic ascites. There is no retroperitoneal hematoma. Musculoskeletal: There C fascial plane edema surrounding the left hip. There is also subcutaneous edema and swelling along the lower anterior left abdominal wall surrounding the left hip. There is also mild edema in the left labia majora. No acute fractures are seen. Healed sacral fracture present. IMPRESSION: 1. No acute posttraumatic sequelae in the abdomen or pelvis. 2. Soft tissue swelling and edema surrounding the left hip and lower anterior left abdominal wall. No acute fractures are seen. 3. Questionable irregular circumferential wall thickening of the sigmoid colon. Recommend correlation with colonoscopy to exclude underlying mass. 4. Nonobstructing bilateral renal calculi. Electronically Signed   By: Greig Pique M.D.   On: 01/04/2024 16:57         LOS: 7 days   Time spent= 41 mins    Meliton Monte, Duffy [facility]  If 7PM-7AM, please contact night-coverage  01/06/2024, 9:59 AM

## 2024-01-06 NOTE — Progress Notes (Addendum)
 It was due for changing needle. Patient's mom didn't want to change the needle because she is going home tomorrow. Patient also showed the gesture and sounds she didn't want to needle change. Explained patient's mom normally change every 7 days due to preventing infection. Mom understood it, but she didn't want to needle change today and de-access tomorrow. Only change dressing due to dressing didn't seal well. HS [employer]

## 2024-01-06 NOTE — TOC Progression Note (Signed)
 Transition of Care [facility]) - Progression Note    Patient Details  Name: KEONDA DOW MRN: [id number] Date of Birth: 27-Jan-1971  Transition of [facility] CM/SW Contact  Robynn Eileen Hoose, RN Phone Number: 01/06/2024, 9:36 AM  Clinical Narrative:   Secure message received from provider regarding hoyer lift delivery arrival date. Message to Ada with [employer], she did not see where an order was placed. Per [facility] could get hoyer lift delivered by tomorrow.If provider is wanting to discharge pt today, they will work on getting the delivery today. Provider made aware.                      Expected Discharge Plan and Services                                               Social Drivers of Health (SDOH) Interventions SDOH Screenings   Food Insecurity: No Food Insecurity (12/31/2023)  Housing: Unknown (12/31/2023)  Transportation Needs: Unknown (12/31/2023)  Utilities: Not At Risk (12/31/2023)  Financial Resource Strain: Low Risk  (12/09/2021)  Tobacco Use: Low Risk  (01/02/2024)    Readmission Risk Interventions    01/01/2024    2:58 PM 12/31/2023    4:25 PM  Readmission Risk Prevention Plan  Post Dischage Appt Complete Complete  Medication Screening Complete Complete  Transportation Screening Complete Complete

## 2024-01-06 NOTE — Plan of Care (Signed)
?  Problem: Health Behavior/Discharge Planning: ?Goal: Ability to manage health-related needs will improve ?Outcome: Progressing ?  ?Problem: Nutrition: ?Goal: Adequate nutrition will be maintained ?Outcome: Progressing ?  ?Problem: Coping: ?Goal: Level of anxiety will decrease ?Outcome: Progressing ?  ?Problem: Elimination: ?Goal: Will not experience complications related to bowel motility ?Outcome: Progressing ?  ?Problem: Safety: ?Goal: Ability to remain free from injury will improve ?Outcome: Progressing ?  ?

## 2024-01-06 NOTE — Plan of Care (Signed)

## 2024-01-07 ENCOUNTER — Other Ambulatory Visit (HOSPITAL_COMMUNITY): Payer: Self-pay

## 2024-01-07 DIAGNOSIS — S7292XA Unspecified fracture of left femur, initial encounter for closed fracture: Secondary | ICD-10-CM | POA: Diagnosis not present

## 2024-01-07 LAB — BASIC METABOLIC PANEL WITH GFR
Anion gap: 5 (ref 5–15)
BUN: 13 mg/dL (ref 6–20)
CO2: 26 mmol/L (ref 22–32)
Calcium: 8.3 mg/dL — ABNORMAL LOW (ref 8.9–10.3)
Chloride: 107 mmol/L (ref 98–111)
Creatinine, Ser: 0.47 mg/dL (ref 0.44–1.00)
GFR, Estimated: >60 mL/min (ref >60)
Glucose, Bld: 90 mg/dL (ref 70–99)
Potassium: 3.8 mmol/L (ref 3.5–5.1)
Sodium: 138 mmol/L (ref 135–145)

## 2024-01-07 LAB — CBC WITH DIFFERENTIAL/PLATELET
Abs Immature Granulocytes: 0.06 K/uL (ref 0.00–0.07)
Basophils Absolute: 0 K/uL (ref 0.0–0.1)
Basophils Relative: 0 %
Eosinophils Absolute: 0.2 K/uL (ref 0.0–0.5)
Eosinophils Relative: 2 %
HCT: 25.6 % — ABNORMAL LOW (ref 36.0–46.0)
Hemoglobin: 8.1 g/dL — ABNORMAL LOW (ref 12.0–15.0)
Immature Granulocytes: 1 %
Lymphocytes Relative: 39 %
Lymphs Abs: 2.9 K/uL (ref 0.7–4.0)
MCH: 30.2 pg (ref 26.0–34.0)
MCHC: 31.6 g/dL (ref 30.0–36.0)
MCV: 95.5 fL (ref 80.0–100.0)
Monocytes Absolute: 0.7 K/uL (ref 0.1–1.0)
Monocytes Relative: 10 %
Neutro Abs: 3.7 K/uL (ref 1.7–7.7)
Neutrophils Relative %: 48 %
Platelets: 179 K/uL (ref 150–400)
RBC: 2.68 MIL/uL — ABNORMAL LOW (ref 3.87–5.11)
RDW: 15.2 % (ref 11.5–15.5)
WBC: 7.6 K/uL (ref 4.0–10.5)
nRBC: 0 % (ref 0.0–0.2)

## 2024-01-07 LAB — ALBUMIN: Albumin: 2 g/dL — ABNORMAL LOW (ref 3.5–5.0)

## 2024-01-07 MED ORDER — FERROUS SULFATE 325 (65 FE) MG PO TABS
325.0000 mg | ORAL_TABLET | ORAL | 0 refills | Status: AC
  Filled 2024-01-07: qty 15, 30d supply, fill #0

## 2024-01-07 MED ORDER — HEPARIN SOD (PORK) LOCK FLUSH 100 UNIT/ML IV SOLN
500.0000 [IU] | INTRAVENOUS | Status: AC | PRN
  Administered 2024-01-07: 500 [IU]

## 2024-01-07 MED ORDER — DIVALPROEX SODIUM 125 MG PO CPSP: 125.0000 mg | ORAL_CAPSULE | Freq: Four times a day (QID) | ORAL | Status: AC

## 2024-01-07 NOTE — TOC Transition Note (Incomplete)
 Transition of Care [facility]) - Discharge Note   Patient Details  Name: Molly Duffy MRN: [id number] Date of Birth: Oct 28, 1970  [facility]) CM/SW Contact:  Rosalva Jon Bloch, RN Phone Number: 01/07/2024, 1:47 PM   Clinical Narrative:    Patient will DC to: home Anticipated DC date: 01/07/2024 Family notified: yes Transport by: car   Per MD patient ready for DC today. RN, patient, patient's  parents and  [facility] notified of DC. Ambulance transport requested for patient.   RNCM will sign off for now as intervention is no longer needed. Please consult us  again if new needs arise.    Final next level of care: Home w Home Health Services Barriers to Discharge: No Barriers Identified   Patient Goals and CMS Choice     Choice offered to / list presented to : Parent      Discharge Placement                       Discharge Plan and Services Additional resources added to the After Visit Summary for     Discharge Planning Services: CM Consult            DME Arranged: Hospital bed, Other see comment (drop arm bedside commode) DME Agency: [employer] Date DME Agency Contacted: 01/07/24 Time DME Agency Contacted: 35 Representative spoke with at DME Agency: Darlyn HH Arranged: RN, PT, OT [facility] Agency: [facility] Date HH Agency Contacted: 01/07/24 Time HH Agency Contacted: [phone] Representative spoke with at [facility] Agency: [facility]  Social Drivers of Health (SDOH) Interventions SDOH Screenings   Food Insecurity: No Food Insecurity (12/31/2023)  Housing: Unknown (12/31/2023)  Transportation Needs: Unknown (12/31/2023)  Utilities: Not At Risk (12/31/2023)  Financial Resource Strain: Low Risk  (12/09/2021)  Tobacco Use: Low Risk  (01/02/2024)     Readmission Risk Interventions    01/01/2024    2:58 PM 12/31/2023    4:25 PM  Readmission Risk Prevention Plan  Post Dischage Appt Complete Complete  Medication Screening Complete Complete   Transportation Screening Complete Complete

## 2024-01-07 NOTE — Progress Notes (Signed)
 Occupational Therapy Treatment Patient Details Name: Molly Duffy MRN: [id number] DOB: October 23, 1970 Today's Date: 01/07/2024   History of present illness Molly Duffy is a 53 y.o. female admitted 9/14 after fall at [employer] resulting in left distal femur fx that initially parents were going to choose non-operative management. Pt ultimately underwent ORIF on 9/17. 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   OT comments  Pt seen in collaboration with PT to optimize mobility. Pt needing min encouragement for mobility and engagement in non-painful ROM to non-injured RLE prior to agreeing to mobilize as pt with max anticipation of pain. Pt needing total A +2 to come to EOB this session; once EOB and provided increased time, able to tolerate light flexion of knee with foot supported. Engaged in ROM/AROM with PT. Participating in grooming tasks with set-up/supervision with encouragement and cues for thoroughness when brushing hair. Educated mother regarding use of bed chuck and positioning for rest. Will continue to follow. Mother max appreciative of education provided. Will continue to follow. Pt to continue to benefit from acute OT services. Recommending discharge home with HHOT and support from family.       If plan is discharge home, recommend the following:  Two people to help with walking and/or transfers   Equipment Recommendations  [place] lift;Hospital bed    Recommendations for Other Services      Precautions / Restrictions Precautions Precautions: Fall Recall of Precautions/Restrictions: Impaired Restrictions Weight Bearing Restrictions Per Provider Order: Yes LLE Weight Bearing Per Provider Order: Weight bearing as tolerated       Mobility Bed Mobility Overal bed mobility: Needs Assistance Bed Mobility: Supine to Sit, Sit to Supine, Rolling Rolling: Total assist, +2 for physical assistance, +2 for safety/equipment    Supine to sit: +2 for physical assistance, Max assist, Total assist Sit to supine: Total assist, +2 for physical assistance   General bed mobility comments: total A predominantly limited by pain and anticipation of pain experience distracting pt from command following.    Transfers                   General transfer comment: deferred this session     Balance Overall balance assessment: Needs assistance Sitting-balance support: Bilateral upper extremity supported, Feet supported Sitting balance-Leahy Scale: Fair Sitting balance - Comments: sitting EO with supervision with LEs propped on supportive surface                                   ADL either performed or assessed with clinical judgement   ADL Overall ADL's : Needs assistance/impaired     Grooming: Set up;Brushing hair;Sitting;Cueing for sequencing                                      Extremity/Trunk Assessment Upper Extremity Assessment Upper Extremity Assessment: Generalized weakness (at shoulder)   Lower Extremity Assessment Lower Extremity Assessment: Defer to PT evaluation        Vision       Perception     Praxis     Communication Communication Communication: Impaired Factors Affecting Communication: Reduced clarity of speech   Cognition Arousal: Alert Behavior During Therapy: Anxious Cognition: History of cognitive impairments             OT - Cognition Comments:  follows one step commands with increased time. Needs redirection to task at hand when pain involved                 Following commands: Impaired Following commands impaired: Follows one step commands with increased time, Follows one step commands inconsistently      Cueing   Cueing Techniques: Verbal cues  Exercises      Shoulder Instructions       General Comments educated mother regarding positioning in R sidelying as this is pt's preferred position for sleep    Pertinent  Vitals/ Pain       Pain Assessment Pain Assessment: Faces Faces Pain Scale: Hurts even more Pain Location: LLE with movement Pain Descriptors / Indicators: Grimacing, Guarding, Discomfort, Moaning Pain Intervention(s): Limited activity within patient's tolerance, Monitored during session  Home Living                                          Prior Functioning/Environment              Frequency  Min 2X/week        Progress Toward Goals  OT Goals(current goals can now be found in the care plan section)  Progress towards OT goals: Progressing toward goals  Acute Rehab OT Goals Time For Goal Achievement: 01/18/24 Potential to Achieve Goals: Fair ADL Goals Pt Will Perform Grooming: with min assist;sitting Pt Will Perform Upper Body Dressing: with min assist;sitting Pt Will Transfer to Toilet: with mod assist;stand pivot transfer;bedside commode Additional ADL Goal #1: Pt will perform bed mobility with moderate assistance in preparation for ADLs.  Plan      Co-evaluation    PT/OT/SLP Co-Evaluation/Treatment: Yes Reason for Co-Treatment: Complexity of the patient's impairments (multi-system involvement);Necessary to address cognition/behavior during functional activity;For patient/therapist safety;To address functional/ADL transfers PT goals addressed during session: Mobility/safety with mobility;Balance OT goals addressed during session: ADL's and self-care      AM-PAC OT 6 Clicks Daily Activity     Outcome Measure   Help from another person eating meals?: A Little Help from another person taking care of personal grooming?: A Lot Help from another person toileting, which includes using toliet, bedpan, or urinal?: Total Help from another person bathing (including washing, rinsing, drying)?: Total Help from another person to put on and taking off regular upper body clothing?: A Little Help from another person to put on and taking off regular lower  body clothing?: A Lot 6 Click Score: 12    End of Session    OT Visit Diagnosis: Unsteadiness on feet (R26.81);Pain;Other symptoms and signs involving cognitive function   Activity Tolerance Patient limited by pain   Patient Left with call bell/phone within reach;with family/visitor present;in bed;with bed alarm set   Nurse Communication Mobility status        Time: 8756-8664 OT Time Calculation (min): 52 min  Charges: OT General Charges $OT Visit: 1 Visit OT Treatments $Self Care/Home Management : 8-22 mins  Molly Duffy, OTR/L [facility] Acute Rehabilitation Office: [phone]   Molly Duffy 01/07/2024, 1:57 PM

## 2024-01-07 NOTE — Progress Notes (Signed)
 Patient discharged via ptar, to home, AVS reviewed at bedside, home medication picked up from main pharmacy, toc meds pickup and delivered at bedside, IV port de accessed.

## 2024-01-07 NOTE — Plan of Care (Signed)

## 2024-01-07 NOTE — Discharge Summary (Signed)
 Physician Discharge Summary  Molly Duffy FMW:[id number] DOB: 1970-10-18 DOA: 12/30/2023  PCP: Toribio Jerel MATSU, MD  Admit date: 12/30/2023 Discharge date: 01/07/2024  Time spent: 40 minutes  Recommendations for Outpatient Follow-up:  Follow outpatient CBC/CMP  Follow with orthopedics outpatient  Follow anemia outpatient - iron def Needs outpatient colonoscopy    Discharge Diagnoses:  Principal Problem:   Femur fracture, left [facility])   Discharge Condition: stable  Diet recommendation: heart healthy   Filed Weights   12/30/23 1314 01/02/24 0713  Weight: 69.9 kg 69.9 kg    History of present illness:   53 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    Now s/p surgery with orthopedics.  Stable for discharge home 9/22.   Hospital Course:  Assessment and Plan:  Acute Comminuted left femoral fracture,POA: CT from 9/14 noted comminuted fracture of the distal femoral diaphysis and metaphysis  s/p  left femur open reduction and internal fixation done on 9/17. - follow with orthopedics outpatient - WBAT, ASA for DVT ppx - follow with ortho 2 weeks after d/c for wound check and repeat x rays -prn analgesics  -PT/OT   Acute Blood Loss Anemia Iron Def Anemia CT from 9/14 showed intramuscular and deep fascial plane hemorrhage surrounding the fractures S/p 2 units pRBC Hb 8.1 today Discharge with PO iron    Left Labial Swelling Bruising to LLQ and Left Leg Improving per discussion with mother CT with soft tissue swelling and deman surrounding the L hip and lower anterior L abdominal wall  Still has LLE swelling after surgery, elevate LLE   Leukocytosis,POA  E. Coli UTI: frequent history of UTIs-cultures 50,000 E. Coli Nonobstructive urinary stones in the past: ceftriaxone  9/14-9/16. Augmentin  9/17-9/19.   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 Lungs:  unlabored Neurological: Alert - developmental delay, unintelligible speech. Moves all extremities 4. Cranial nerves II through XII grossly intact. Extremities: LLE swelling, inferior incision with surrounding bruising, some serosanguinous drainage (discussed with orthopedics, not unexpected)  Discharge Instructions   Discharge Instructions     Call MD for:  difficulty breathing, headache or visual disturbances   Complete by: As directed    Call MD for:  extreme fatigue   Complete by: As directed    Call MD for:  hives   Complete by: As directed    Call MD for:  persistant dizziness or light-headedness   Complete by: As directed    Call MD for:  persistant nausea and vomiting   Complete by: As directed    Call MD for:  redness, tenderness, or signs of infection (pain, swelling, redness, odor or green/yellow discharge around incision site)   Complete by: As directed  Call MD for:  severe uncontrolled pain   Complete by: As directed    Call MD for:  temperature >100.4   Complete by: As directed    Diet - low sodium heart healthy   Complete by: As directed    Discharge instructions   Complete by: As directed    You were seen after Molly Duffy fall with Molly Duffy distal left femur fracture.   This was  repaired surgically by orthopedics.  You should weight bear as tolerated to the left leg.  Use tylenol  as needed for pain and norco for breakthrough pain.  Follow with orthopedics as an outpatient as planned.   You have iron deficiency anemia.  We'll send you home with iron.  You should follow outpatient with your PCP repeat labs.  You need an outpatient colonoscopy for thickening of your sigmoid colon.  Return for new, recurrent, or worsening symptoms.  Please ask your PCP to request records from this hospitalization so they know what was done and what the next steps will be.   Discharge wound care:   Complete by: As directed    Per orthopedics   Increase activity slowly   Complete by: As directed       Allergies as of 01/07/2024       Reactions   Levetiracetam Other (See Comments)   Makes seizures worse Seizures worse.   Phenobarbital Nausea And Vomiting, Other (See Comments)   Makes seizures worse. Adverse effects   Tiagabine Hcl Other (See Comments)   Makes seizures worse   Vigabatrin Other (See Comments)   Nonconvulsive status.    Phenytoin Sodium Extended         Medication List     TAKE these medications    acetaminophen  325 MG tablet Commonly known as: TYLENOL  Take 1-2 tablets (325-650 mg total) by mouth every 6 (six) hours as needed for mild pain (pain score 1-3), fever or headache.   aspirin  325 MG tablet Take 1 tablet (325 mg total) by mouth daily.   Cranberry 450 MG Tabs Take 1 tablet by mouth in the morning and at bedtime.   Cranberry 200 MG Caps Take 1 capsule by mouth in the morning and at bedtime.   divalproex  125 MG capsule Commonly known as: DEPAKOTE  SPRINKLE Take 500 mg by mouth AC breakfast. Take with the 250 mg Depakote  to equal 375 mg TID   Epidiolex  100 MG/ML solution Generic drug: cannabidiol  Take 6 mLs by mouth in the morning and at bedtime.   estradiol  0.1 MG/GM vaginal cream Commonly known as: ESTRACE  Place 0.1 g vaginally 3  (three) times Molly Duffy week.   ferrous sulfate  325 (65 FE) MG tablet Take 1 tablet (325 mg total) by mouth every other day. Start taking on: January 09, 2024   Fintepla  2.2 MG/ML Soln Generic drug: Fenfluramine  HCl Take 3 mLs by mouth in the morning, at noon, and at bedtime.   HYDROcodone -acetaminophen  5-325 MG tablet Commonly known as: NORCO/VICODIN Take 1-2 tablets by mouth every 4 (four) hours as needed for moderate pain (pain score 4-6) or severe pain (pain score 7-10).   LORazepam  1 MG tablet Commonly known as: ATIVAN  Take 1 mg by mouth as needed. One or two for seizures as needed. What changed: Another medication with the same name was changed. Make sure you understand how and when to take each.   lorazepam  4 MG/ML injection Commonly known as: ATIVAN  0.5 ml ( 2 mg) in one nostril prn prolonged seizure What changed:  how  much to take how to take this when to take this reasons to take this   methenamine  1 g tablet Commonly known as: HIPREX  Take 1 g by mouth in the morning and at bedtime.   midazolam  50 MG/10ML Soln injection Commonly known as: VERSED  See admin instructions. Use 1 mL per nostril for seizures lasting longer than 5 mins   multivitamin tablet Take 1 tablet by mouth daily.   OVER THE COUNTER MEDICATION Take 1 tablet by mouth daily. **Loratadine Unknown strength**   OVER THE COUNTER MEDICATION Take 1 tablet by mouth daily. **Vitamin K unknown strength**   polyethylene glycol 17 g packet Commonly known as: MIRALAX  / GLYCOLAX  Take 17 g by mouth daily as needed for mild constipation.   PROBIOTIC ACIDOPHILUS PO Take 1 capsule by mouth daily.   Vitamin D -1000 Max St 25 MCG (1000 UT) tablet Generic drug: Cholecalciferol Take 1,000 Units by mouth daily.   zonisamide  25 MG capsule Commonly known as: ZONEGRAN  Take 75 mg by mouth daily.   Zonegran  100 MG capsule Generic drug: zonisamide  See admin instructions. TAKE 1 CAPSULE AT NIGHT, WITH 3 CAPSULES OF  25 MG AT NIGHT FOR TOTAL DOSE OF 175MG                Durable Medical Equipment  (From admission, onward)           Start     Ordered   01/07/24 1100  For home use only DME Bedside commode  Once       Comments: Drop arm bedside commode. 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   The above medical condition requires: Patient requires the ability to reposition frequently   Head must be elevated greater than: 30 degrees   Bed type Semi-electric   Hoyer Lift Yes      01/07/24 1101   01/01/24 1504  For home use only DME Other see comment  Once       Comments: Patient needs hoyer lift and hoyer pad provided.  Patient is S/P Left distal femur fracture repair and will need hoyer lift for safe transfers from bed to wheelchair post-surgery at the home.  Question:  Length of Need  Answer:  12 Months   01/01/24 1505              Discharge Care Instructions  (From admission, onward)           Start     Ordered   01/07/24 0000  Discharge wound care:       Comments: Per orthopedics   01/07/24 1305           Allergies  Allergen Reactions   Levetiracetam Other (See Comments)    Makes seizures worse Seizures worse.   Phenobarbital Nausea And Vomiting and Other (See Comments)    Makes seizures worse. Adverse effects   Tiagabine Hcl Other (See Comments)    Makes seizures worse   Vigabatrin Other (See Comments)    Nonconvulsive status.    Phenytoin Sodium Extended     Follow-up Information  [facility]  Follow up.   Why: [facility]  services.  they will call you to set up services in the next 24-48 hours. Contact information: [address] [place] [address] [phone]         Haddix, Franky SQUIBB, MD. Schedule an appointment as soon as possible for Vista Sawatzky visit in 2 week(s).   Specialty: Orthopedic Surgery Why: For wound check and repeat x-rays Contact information: [address] [place] [address] [phone]         Toribio Jerel MATSU, MD Follow up.   Specialty: Family Medicine Contact information: [address] [place] [address] [phone]                  The results of significant diagnostics from this hospitalization (including imaging, microbiology, ancillary and laboratory) are listed below for reference.    Significant Diagnostic Studies: CT ABDOMEN PELVIS W CONTRAST Result Date: 01/04/2024 CLINICAL DATA:  Fall with abdominal bruising. EXAM: CT ABDOMEN AND PELVIS WITH CONTRAST TECHNIQUE: Multidetector CT imaging of the abdomen and pelvis was performed using the standard protocol following bolus administration of intravenous contrast. RADIATION DOSE REDUCTION: This exam was performed according to the departmental dose-optimization program which includes automated exposure control, adjustment of the mA and/or kV according to patient size and/or use of iterative reconstruction technique. CONTRAST:  75mL OMNIPAQUE  IOHEXOL  350 MG/ML SOLN COMPARISON:  CT pelvis 06/11/2019 FINDINGS: Lower chest: No acute abnormality. Hepatobiliary: No focal liver abnormality is seen. No gallstones, gallbladder wall thickening, or biliary dilatation. Pancreas: Unremarkable. No pancreatic ductal dilatation or surrounding inflammatory changes. Spleen: Normal in size without focal abnormality. Adrenals/Urinary Tract: There are punctate nonobstructing bilateral renal calculi. Otherwise, the kidneys, adrenal glands and bladder are within normal limits. Stomach/Bowel: There is questionable irregular circumferential  focal sigmoid colon wall thickening seen on image 3/64. There is no bowel obstruction, inflammatory stranding, pneumatosis or free air. The appendix and stomach are within normal limits. Vascular/Lymphatic: No significant vascular findings are present. No enlarged abdominal or pelvic lymph nodes. Reproductive: Uterus and bilateral adnexa are unremarkable. Other: No abdominal wall hernia or abnormality. No abdominopelvic ascites. There is no retroperitoneal hematoma. Musculoskeletal: There C fascial plane edema surrounding the left hip. There is also subcutaneous edema and swelling along the lower anterior left abdominal wall surrounding the left hip. There is also mild edema in the left labia majora. No acute fractures are seen. Healed sacral fracture present. IMPRESSION: 1. No acute posttraumatic sequelae in the abdomen or pelvis. 2. Soft tissue swelling and edema surrounding the left hip and lower anterior left abdominal wall. No acute fractures are seen. 3. Questionable irregular circumferential wall thickening of the sigmoid colon. Recommend correlation with colonoscopy to exclude underlying mass. 4. Nonobstructing bilateral renal calculi. Electronically Signed   By: Greig Pique M.D.   On: 01/04/2024 16:57   DG FEMUR PORT MIN 2 VIEWS LEFT Result Date: 01/02/2024 CLINICAL DATA:  Fracture, postop. EXAM: LEFT FEMUR PORTABLE 2 VIEWS COMPARISON:  Preoperative imaging. FINDINGS: Lateral plate and screw fixation of distal femur fracture. Decreased displacement compared to preoperative imaging. Overlying brace in place. Air and edema in the soft tissues related to recent surgery. IMPRESSION: ORIF of distal femur fracture. Electronically Signed   By: Andrea Gasman M.D.   On: 01/02/2024 11:08   DG FEMUR MIN 2 VIEWS LEFT Result Date: 01/02/2024 CLINICAL DATA:  Elective surgery. EXAM: LEFT FEMUR 2 VIEWS COMPARISON:  Preoperative imaging  FINDINGS: Seven fluoroscopic spot views of the left femur submitted from the  operating room. Imaging obtained during plate and screw fixation of distal femur fracture. Fluoroscopy time 54.8 seconds. Dose 5.47 mGy. IMPRESSION: Intraoperative fluoroscopy during distal femur fracture ORIF. Electronically Signed   By: Andrea Gasman M.D.   On: 01/02/2024 11:01   DG C-Arm 1-60 Min-No Report Result Date: 01/02/2024 Fluoroscopy was utilized by the requesting physician.  No radiographic interpretation.   DG Abd Portable 1V Result Date: 01/01/2024 CLINICAL DATA:  Constipation EXAM: PORTABLE ABDOMEN - 1 VIEW COMPARISON:  None Available. FINDINGS: Nonobstructive bowel gas pattern. No free air or pneumatosis. Moderate volume stool projects over the descending and rectosigmoid colon. No abnormal radio-opaque calculi or mass effect. No acute or substantial osseous abnormality. The sacrum and coccyx are partially obscured by overlying bowel contents. IMPRESSION: Nonobstructive bowel gas pattern. Moderate volume stool projects over the descending and rectosigmoid colon. Electronically Signed   By: Limin  Xu M.D.   On: 01/01/2024 10:57   DG CHEST PORT 1 VIEW Result Date: 12/31/2023 EXAM: 1 VIEW XRAY OF THE CHEST 12/31/2023 06:14:00 AM COMPARISON: None available. CLINICAL HISTORY: Port-Muaad Boehning-Cath in place [phone]. Port Berthel Bagnall cath in place. FINDINGS: LINES, TUBES AND DEVICES: Right-sided Port-Khristin Keleher-Cath in place with tip over the right atrium. Left upper chest generator device in place with leads extending superiorly to the left neck. LUNGS AND PLEURA: No focal pulmonary opacity. No pulmonary edema. No pleural effusion. No pneumothorax. HEART AND MEDIASTINUM: No acute abnormality of the cardiac and mediastinal silhouettes. BONES AND SOFT TISSUES: No acute osseous abnormality. IMPRESSION: 1. No acute process. 2. Right-sided Port-Denesha Brouse-Cath in place with tip over the right atrium. 3. Left upper chest generator device in place with leads extending superiorly to the left neck. Electronically signed by: Evalene Coho  MD 12/31/2023 06:40 AM EDT RP Workstation: [id number]   CT FEMUR LEFT WO CONTRAST Result Date: 12/30/2023 CLINICAL DATA:  Fracture planning. EXAM: CT OF THE LOWER LEFT EXTREMITY WITHOUT CONTRAST TECHNIQUE: Multidetector CT imaging of the lower left extremity was performed according to the standard protocol. RADIATION DOSE REDUCTION: This exam was performed according to the departmental dose-optimization program which includes automated exposure control, adjustment of the mA and/or kV according to patient size and/or use of iterative reconstruction technique. COMPARISON:  Left hip x-ray same day FINDINGS: Bones/Joint/Cartilage The bones are osteopenic. Oblique/vertically oriented fracture through the distal femoral diaphysis is seen. The most cranial portion is 17 cm proximal to the knee joint. There is transverse comminuted fracture of the metaphyseal region extending to involve the superior aspect of the lateral femoral condyle. This portion of the fracture appears impacted with fracture fragments distracted up to 15 mm. There is no dislocation. The articulating surfaces of the femoral condyles are not involved in the fracture. Small joint effusion present. Ligaments Suboptimally assessed by CT. Muscles and Tendons There is intramuscular edema and hemorrhage surrounding the fractures. There is deep fascial plane hyperdense fluid posterior to the fracture compatible with hemorrhage. Soft tissues No focal hematoma or fluid collection. IMPRESSION: 1. Comminuted fracture of the distal femoral diaphysis and metaphysis. The most cranial portion of the fracture is 17 cm proximal to the knee joint. There is no dislocation. 2. Small joint effusion. 3. Intramuscular and deep fascial plane hemorrhage surrounding the fractures. Electronically Signed   By: Greig Pique M.D.   On: 12/30/2023 16:57   DG Knee Complete 4 Views Left Result Date: 12/30/2023 CLINICAL DATA:  Fall, knee pain EXAM: LEFT KNEE -  COMPLETE 4+ VIEW  COMPARISON:  None Available. FINDINGS: Comminuted fracture of the mid to distal femur includes Arnie Maiolo longitudinal fracture extending obliquely from the mid to distal shaft to the lateral epicondyle, extending into Kenslee Achorn transverse distal femoral metaphyseal fracture. I cannot exclude extension to weight-bearing articular surface in the knee, with lateral condylar involvement Kenrick Pore possibility. Consider CT of the femur for definitive characterization. IMPRESSION: 1. Comminuted fracture of the mid to distal femur, with Nayali Talerico longitudinal fracture extending obliquely from the mid to distal shaft to the lateral epicondyle, extending into Jasemine Nawaz transverse distal femoral metaphyseal fracture. I cannot exclude extension to the weight-bearing articular surface in the knee, with lateral condylar involvement Preslea Rhodus possibility. Consider CT of the left femur. Electronically Signed   By: Ryan Salvage M.D.   On: 12/30/2023 14:57   DG Hip Unilat With Pelvis 2-3 Views Left Result Date: 12/30/2023 CLINICAL DATA:  Fall, lower extremity pain with movement EXAM: DG HIP (WITH OR WITHOUT PELVIS) 2-3V LEFT COMPARISON:  06/11/2019 FINDINGS: The proximal most margin of Naveena Eyman mid femoral fracture is visible on the frontal projection, extending distally. No fracture of the femoral neck/hip region is identified. IMPRESSION: 1. The proximal most margin of Temika Sutphin mid femoral fracture is visible on the frontal projection, extending distally. Electronically Signed   By: Ryan Salvage M.D.   On: 12/30/2023 14:54    Microbiology: Recent Results (from the past 240 hours)  Urine Culture     Status: Abnormal   Collection Time: 12/30/23  5:16 PM   Specimen: Urine, Random  Result Value Ref Range Status   Specimen Description   Final    URINE, RANDOM Performed at [facility], [address], [place], [place] [address]    Special Requests   Final    NONE Reflexed from [id number] Performed at [facility], [address]  [place], [place] [address]    Culture 50,000 COLONIES/mL ESCHERICHIA COLI (Gregrey Bloyd)  Final   Report Status 01/01/2024 FINAL  Final   Organism ID, Bacteria ESCHERICHIA COLI (Ebba Goll)  Final      Susceptibility   Escherichia coli - MIC*    AMPICILLIN <=2 SENSITIVE Sensitive     CEFAZOLIN  (URINE) Value in next row Sensitive      <=1 SENSITIVEThis is Aishah Teffeteller modified FDA-approved test that has been validated and its performance characteristics determined by the reporting laboratory.  This laboratory is certified under the Clinical Laboratory Improvement Amendments CLIA as qualified to perform high complexity clinical laboratory testing.    CEFEPIME Value in next row Sensitive      <=1 SENSITIVEThis is Kyaire Gruenewald modified FDA-approved test that has been validated and its performance characteristics determined by the reporting laboratory.  This laboratory is certified under the Clinical Laboratory Improvement Amendments CLIA as qualified to perform high complexity clinical laboratory testing.    ERTAPENEM Value in next row Sensitive      <=1 SENSITIVEThis is Keno Caraway modified FDA-approved test that has been validated and its performance characteristics determined by the reporting laboratory.  This laboratory is certified under the Clinical Laboratory Improvement Amendments CLIA as qualified to perform high complexity clinical laboratory testing.    CEFTRIAXONE  Value in next row Sensitive      <=1 SENSITIVEThis is Paxtyn Boyar modified FDA-approved test that has been validated and its performance characteristics determined by the reporting laboratory.  This laboratory is certified under the Clinical Laboratory Improvement Amendments CLIA as qualified to perform high complexity clinical laboratory testing.    CIPROFLOXACIN  Value  in next row Intermediate      <=1 SENSITIVEThis is Dominique Ressel modified FDA-approved test that has been validated and its performance characteristics determined by the reporting laboratory.  This laboratory is certified under the Clinical  Laboratory Improvement Amendments CLIA as qualified to perform high complexity clinical laboratory testing.    GENTAMICIN Value in next row Sensitive      <=1 SENSITIVEThis is Davonta Stroot modified FDA-approved test that has been validated and its performance characteristics determined by the reporting laboratory.  This laboratory is certified under the Clinical Laboratory Improvement Amendments CLIA as qualified to perform high complexity clinical laboratory testing.    NITROFURANTOIN Value in next row Sensitive      <=1 SENSITIVEThis is Che Below modified FDA-approved test that has been validated and its performance characteristics determined by the reporting laboratory.  This laboratory is certified under the Clinical Laboratory Improvement Amendments CLIA as qualified to perform high complexity clinical laboratory testing.    TRIMETH/SULFA Value in next row Sensitive      <=1 SENSITIVEThis is Brennyn Haisley modified FDA-approved test that has been validated and its performance characteristics determined by the reporting laboratory.  This laboratory is certified under the Clinical Laboratory Improvement Amendments CLIA as qualified to perform high complexity clinical laboratory testing.    AMPICILLIN/SULBACTAM Value in next row Sensitive      <=1 SENSITIVEThis is Viviana Trimble modified FDA-approved test that has been validated and its performance characteristics determined by the reporting laboratory.  This laboratory is certified under the Clinical Laboratory Improvement Amendments CLIA as qualified to perform high complexity clinical laboratory testing.    PIP/TAZO Value in next row Sensitive ug/mL     <=4 SENSITIVEThis is Carmon Sahli modified FDA-approved test that has been validated and its performance characteristics determined by the reporting laboratory.  This laboratory is certified under the Clinical Laboratory Improvement Amendments CLIA as qualified to perform high complexity clinical laboratory testing.    MEROPENEM Value in next row Sensitive       <=4 SENSITIVEThis is Brielynn Sekula modified FDA-approved test that has been validated and its performance characteristics determined by the reporting laboratory.  This laboratory is certified under the Clinical Laboratory Improvement Amendments CLIA as qualified to perform high complexity clinical laboratory testing.    * 50,000 COLONIES/mL ESCHERICHIA COLI     Labs: Basic Metabolic Panel: Recent Labs  Lab 01/03/24 0311 01/04/24 0253 01/05/24 0330 01/06/24 0411 01/07/24 0445  NA 137 138 137 136 138  K 3.8 3.3* 3.7 3.6 3.8  CL 99 100 103 105 107  CO2 28 27 26 25 26   GLUCOSE 128* 119* 124* 116* 90  BUN 14 18 19 18 13   CREATININE 0.73 0.47 0.57 0.60 0.47  CALCIUM 8.1* 8.1* 8.0* 8.1* 8.3*   Liver Function Tests: No results for input(s): AST, ALT, ALKPHOS, BILITOT, PROT, ALBUMIN in the last 168 hours. No results for input(s): LIPASE, AMYLASE in the last 168 hours. No results for input(s): AMMONIA in the last 168 hours. CBC: Recent Labs  Lab 01/03/24 0311 01/04/24 0253 01/05/24 0330 01/06/24 0411 01/07/24 0445  WBC 13.8* 11.2* 8.3 7.5 7.6  NEUTROABS 8.8* 6.3 4.3 4.1 3.7  HGB 6.8* 8.6* 7.8* 7.9* 8.1*  HCT 20.8* 25.8* 24.4* 24.8* 25.6*  MCV 91.6 90.2 92.8 93.6 95.5  PLT 95* 121* 141* 154 179   Cardiac Enzymes: No results for input(s): CKTOTAL, CKMB, CKMBINDEX, TROPONINI in the last 168 hours. BNP: BNP (last 3 results) No results for input(s): BNP in the last 8760 hours.  ProBNP (  last 3 results) No results for input(s): PROBNP in the last 8760 hours.  CBG: Recent Labs  Lab 01/04/24 0810  GLUCAP 91       Signed:  Meliton Monte MD.  [facility] 01/07/2024, 1:06 PM

## 2024-01-07 NOTE — Progress Notes (Signed)
    Durable Medical Equipment  (From admission, onward)           Start     Ordered   01/07/24 1100  For home use only DME Bedside commode  Once       Comments: Drop arm bedside commode. 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   The above medical condition requires: Patient requires the ability to reposition frequently   Head must be elevated greater than: 30 degrees   Bed type Semi-electric   Hoyer Lift Yes      01/07/24 1101   01/01/24 1504  For home use only DME Other see comment  Once       Comments: Patient needs hoyer lift and hoyer pad provided.  Patient is S/P Left distal femur fracture repair and will need hoyer lift for safe transfers from bed to wheelchair post-surgery at the home.  Question:  Length of Need  Answer:  12 Months   01/01/24 1505

## 2024-01-07 NOTE — Progress Notes (Signed)
 Physical Therapy Treatment Patient Details Name: Molly Duffy MRN: [id number] DOB: 11/13/1970 Today's Date: 01/07/2024   History of Present Illness Molly Duffy is a 53 y.o. female admitted 9/14 after fall at [employer] resulting in left distal femur fx that initially parents were going to choose non-operative management. Pt ultimately underwent ORIF on 9/17. 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    PT Comments  Pt seen with OT for optimal mobility. Pt much more engaged in session today, speaking with therapists and laughing at times. Pt continues to be very fearful of moving due to pain but tolerated coming to EOB and knee flexion to 40 deg. Pt with noted redness, swelling, and some oozing at L knee incision. Pt performed AA/P ROM exercises with PT and seated ADL's with OT and overall tolerated unsupported sitting >20 mins. Attempted to assist pt into R SL end of session as this is a position of comfort for her at home but could not tolerate in hospital bed. Was able to position in modified SL with pillow under L hip and between knees. Recommend HHPT at d/c and equipment listed below. PT will continue to follow.     If plan is discharge home, recommend the following: Two people to help with walking and/or transfers;Two people to help with bathing/dressing/bathroom;Assistance with cooking/housework;Assist for transportation;Help with stairs or ramp for entrance   Can travel by private vehicle        Equipment Recommendations  [place] lift;Hospital bed    Recommendations for Other Services       Precautions / Restrictions Precautions Precautions: Fall Recall of Precautions/Restrictions: Impaired Restrictions Weight Bearing Restrictions Per Provider Order: Yes LLE Weight Bearing Per Provider Order: Weight bearing as tolerated     Mobility  Bed Mobility Overal bed mobility: Needs Assistance Bed Mobility: Supine to Sit,  Sit to Supine, Rolling Rolling: Total assist, +2 for physical assistance, +2 for safety/equipment   Supine to sit: +2 for physical assistance, Max assist, Total assist Sit to supine: Total assist, +2 for physical assistance   General bed mobility comments: total A predominantly limited by pain and anticipation of pain experience. Pt occasionally resists motion. Pt tolerated sitting EOB x20 mins with feet supported on trash can.    Transfers                   General transfer comment: deferred this session due to increased swelling, redness, and drainage and L knee incision    Ambulation/Gait               General Gait Details: Unable   Stairs             Wheelchair Mobility     Tilt Bed    Modified Rankin (Stroke Patients Only)       Balance Overall balance assessment: Needs assistance Sitting-balance support: Bilateral upper extremity supported, Feet supported Sitting balance-Leahy Scale: Fair Sitting balance - Comments: pt maintained unsupported sitting balance, was able to lean fwd and back without losing balance. Was also able to tolerate unsupported long sitting in bed                                    Communication Communication Communication: Impaired Factors Affecting Communication: Reduced clarity of speech  Cognition Arousal: Alert Behavior During Therapy: Anxious   PT - Cognitive impairments: History of cognitive impairments,  Problem solving, Safety/Judgement                       PT - Cognition Comments: pt interactive today, speaking more, able to be encouraged to sit up Following commands: Impaired Following commands impaired: Follows one step commands with increased time, Follows one step commands inconsistently    Cueing Cueing Techniques: Verbal cues  Exercises General Exercises - Lower Extremity Ankle Circles/Pumps: PROM, Both, 10 reps, Supine Long Arc Quad: AAROM, Both, 5 reps, Seated Other  Exercises Other Exercises: prolonged L knee flex stretch. Tolerated ~40 deg knee flex    General Comments General comments (skin integrity, edema, etc.): attempted R SL position as mom reports this is pt's position of comfort at home. Thi s was not comfortable to pt in hospital bed. Was hesitant to use lift pad to get pt to chair due to bruising behind L knee where lift pad would press during transfer      Pertinent Vitals/Pain Pain Assessment Pain Assessment: Faces Faces Pain Scale: Hurts even more Breathing: normal Negative Vocalization: occasional moan/groan, low speech, negative/disapproving quality Facial Expression: smiling or inexpressive Body Language: relaxed Consolability: no need to console PAINAD Score: 1 Pain Location: LLE with movement Pain Descriptors / Indicators: Grimacing, Guarding, Discomfort Pain Intervention(s): Limited activity within patient's tolerance, Monitored during session    Home Living                          Prior Function            PT Goals (current goals can now be found in the care plan section) Acute Rehab PT Goals Patient Stated Goal: Return Home PT Goal Formulation: With patient/family Time For Goal Achievement: 01/18/24 Potential to Achieve Goals: Good    Frequency    Min 2X/week      PT Plan      Co-evaluation PT/OT/SLP Co-Evaluation/Treatment: Yes Reason for Co-Treatment: Complexity of the patient's impairments (multi-system involvement);Necessary to address cognition/behavior during functional activity;For patient/therapist safety;To address functional/ADL transfers PT goals addressed during session: Mobility/safety with mobility;Balance OT goals addressed during session: ADL's and self-care      AM-PAC PT 6 Clicks Mobility   Outcome Measure  Help needed turning from your back to your side while in a flat bed without using bedrails?: Total Help needed moving from lying on your back to sitting on the side  of a flat bed without using bedrails?: Total Help needed moving to and from a bed to a chair (including a wheelchair)?: Total Help needed standing up from a chair using your arms (e.g., wheelchair or bedside chair)?: Total Help needed to walk in hospital room?: Total Help needed climbing 3-5 steps with a railing? : Total 6 Click Score: 6    End of Session   Activity Tolerance: Patient limited by pain Patient left: in bed;with call bell/phone within reach;with bed alarm set;with family/visitor present Nurse Communication: Mobility status;Need for lift equipment PT Visit Diagnosis: Muscle weakness (generalized) (M62.81)     Time: 8755-8667 PT Time Calculation (min) (ACUTE ONLY): 48 min  Charges:    $Therapeutic Exercise: 8-22 mins $Therapeutic Activity: 8-22 mins PT General Charges $$ ACUTE PT VISIT: 1 Visit                     Richerd Lipoma, PT  Acute Rehab Services Secure chat preferred Office [phone]    Richerd CROME Arihana Ambrocio 01/07/2024, 2:29 PM

## 2024-01-09 DIAGNOSIS — D509 Iron deficiency anemia, unspecified: Secondary | ICD-10-CM | POA: Diagnosis not present

## 2024-01-09 DIAGNOSIS — Z7982 Long term (current) use of aspirin: Secondary | ICD-10-CM | POA: Diagnosis not present

## 2024-01-09 DIAGNOSIS — F79 Unspecified intellectual disabilities: Secondary | ICD-10-CM | POA: Diagnosis not present

## 2024-01-09 DIAGNOSIS — Z87440 Personal history of urinary (tract) infections: Secondary | ICD-10-CM | POA: Diagnosis not present

## 2024-01-09 DIAGNOSIS — Z993 Dependence on wheelchair: Secondary | ICD-10-CM | POA: Diagnosis not present

## 2024-01-09 DIAGNOSIS — Z556 Problems related to health literacy: Secondary | ICD-10-CM | POA: Diagnosis not present

## 2024-01-09 DIAGNOSIS — G40834 Dravet syndrome, intractable, without status epilepticus: Secondary | ICD-10-CM | POA: Diagnosis not present

## 2024-01-09 DIAGNOSIS — K5909 Other constipation: Secondary | ICD-10-CM | POA: Diagnosis not present

## 2024-01-09 DIAGNOSIS — D72829 Elevated white blood cell count, unspecified: Secondary | ICD-10-CM | POA: Diagnosis not present

## 2024-01-09 DIAGNOSIS — D696 Thrombocytopenia, unspecified: Secondary | ICD-10-CM | POA: Diagnosis not present

## 2024-01-09 DIAGNOSIS — M810 Age-related osteoporosis without current pathological fracture: Secondary | ICD-10-CM | POA: Diagnosis not present

## 2024-01-09 DIAGNOSIS — S7292XA Unspecified fracture of left femur, initial encounter for closed fracture: Secondary | ICD-10-CM | POA: Diagnosis not present

## 2024-01-09 DIAGNOSIS — S7292XS Unspecified fracture of left femur, sequela: Secondary | ICD-10-CM | POA: Diagnosis not present

## 2024-01-09 DIAGNOSIS — G40219 Localization-related (focal) (partial) symptomatic epilepsy and epileptic syndromes with complex partial seizures, intractable, without status epilepticus: Secondary | ICD-10-CM | POA: Diagnosis not present

## 2024-01-09 DIAGNOSIS — D62 Acute posthemorrhagic anemia: Secondary | ICD-10-CM | POA: Diagnosis not present

## 2024-01-09 DIAGNOSIS — Z9181 History of falling: Secondary | ICD-10-CM | POA: Diagnosis not present

## 2024-01-09 DIAGNOSIS — S79192D Other physeal fracture of lower end of left femur, subsequent encounter for fracture with routine healing: Secondary | ICD-10-CM | POA: Diagnosis not present

## 2024-01-10 DIAGNOSIS — D696 Thrombocytopenia, unspecified: Secondary | ICD-10-CM | POA: Diagnosis not present

## 2024-01-10 DIAGNOSIS — D62 Acute posthemorrhagic anemia: Secondary | ICD-10-CM | POA: Diagnosis not present

## 2024-01-10 DIAGNOSIS — G40834 Dravet syndrome, intractable, without status epilepticus: Secondary | ICD-10-CM | POA: Diagnosis not present

## 2024-01-10 DIAGNOSIS — G40219 Localization-related (focal) (partial) symptomatic epilepsy and epileptic syndromes with complex partial seizures, intractable, without status epilepticus: Secondary | ICD-10-CM | POA: Diagnosis not present

## 2024-01-10 DIAGNOSIS — S79192D Other physeal fracture of lower end of left femur, subsequent encounter for fracture with routine healing: Secondary | ICD-10-CM | POA: Diagnosis not present

## 2024-01-10 DIAGNOSIS — D509 Iron deficiency anemia, unspecified: Secondary | ICD-10-CM | POA: Diagnosis not present

## 2024-01-14 DIAGNOSIS — G40219 Localization-related (focal) (partial) symptomatic epilepsy and epileptic syndromes with complex partial seizures, intractable, without status epilepticus: Secondary | ICD-10-CM | POA: Diagnosis not present

## 2024-01-14 DIAGNOSIS — S79192D Other physeal fracture of lower end of left femur, subsequent encounter for fracture with routine healing: Secondary | ICD-10-CM | POA: Diagnosis not present

## 2024-01-14 DIAGNOSIS — G40834 Dravet syndrome, intractable, without status epilepticus: Secondary | ICD-10-CM | POA: Diagnosis not present

## 2024-01-14 DIAGNOSIS — D62 Acute posthemorrhagic anemia: Secondary | ICD-10-CM | POA: Diagnosis not present

## 2024-01-14 DIAGNOSIS — D509 Iron deficiency anemia, unspecified: Secondary | ICD-10-CM | POA: Diagnosis not present

## 2024-01-14 DIAGNOSIS — D696 Thrombocytopenia, unspecified: Secondary | ICD-10-CM | POA: Diagnosis not present

## 2024-01-16 DIAGNOSIS — S79192D Other physeal fracture of lower end of left femur, subsequent encounter for fracture with routine healing: Secondary | ICD-10-CM | POA: Diagnosis not present

## 2024-01-16 DIAGNOSIS — D62 Acute posthemorrhagic anemia: Secondary | ICD-10-CM | POA: Diagnosis not present

## 2024-01-16 DIAGNOSIS — G40834 Dravet syndrome, intractable, without status epilepticus: Secondary | ICD-10-CM | POA: Diagnosis not present

## 2024-01-16 DIAGNOSIS — D509 Iron deficiency anemia, unspecified: Secondary | ICD-10-CM | POA: Diagnosis not present

## 2024-01-16 DIAGNOSIS — G40219 Localization-related (focal) (partial) symptomatic epilepsy and epileptic syndromes with complex partial seizures, intractable, without status epilepticus: Secondary | ICD-10-CM | POA: Diagnosis not present

## 2024-01-16 DIAGNOSIS — D696 Thrombocytopenia, unspecified: Secondary | ICD-10-CM | POA: Diagnosis not present

## 2024-01-18 DIAGNOSIS — S79192D Other physeal fracture of lower end of left femur, subsequent encounter for fracture with routine healing: Secondary | ICD-10-CM | POA: Diagnosis not present

## 2024-01-18 DIAGNOSIS — G40834 Dravet syndrome, intractable, without status epilepticus: Secondary | ICD-10-CM | POA: Diagnosis not present

## 2024-01-18 DIAGNOSIS — D62 Acute posthemorrhagic anemia: Secondary | ICD-10-CM | POA: Diagnosis not present

## 2024-01-18 DIAGNOSIS — D509 Iron deficiency anemia, unspecified: Secondary | ICD-10-CM | POA: Diagnosis not present

## 2024-01-18 DIAGNOSIS — G40219 Localization-related (focal) (partial) symptomatic epilepsy and epileptic syndromes with complex partial seizures, intractable, without status epilepticus: Secondary | ICD-10-CM | POA: Diagnosis not present

## 2024-01-18 DIAGNOSIS — D696 Thrombocytopenia, unspecified: Secondary | ICD-10-CM | POA: Diagnosis not present

## 2024-01-21 DIAGNOSIS — D509 Iron deficiency anemia, unspecified: Secondary | ICD-10-CM | POA: Diagnosis not present

## 2024-01-21 DIAGNOSIS — G40834 Dravet syndrome, intractable, without status epilepticus: Secondary | ICD-10-CM | POA: Diagnosis not present

## 2024-01-21 DIAGNOSIS — D696 Thrombocytopenia, unspecified: Secondary | ICD-10-CM | POA: Diagnosis not present

## 2024-01-21 DIAGNOSIS — G40219 Localization-related (focal) (partial) symptomatic epilepsy and epileptic syndromes with complex partial seizures, intractable, without status epilepticus: Secondary | ICD-10-CM | POA: Diagnosis not present

## 2024-01-21 DIAGNOSIS — D62 Acute posthemorrhagic anemia: Secondary | ICD-10-CM | POA: Diagnosis not present

## 2024-01-21 DIAGNOSIS — S79192D Other physeal fracture of lower end of left femur, subsequent encounter for fracture with routine healing: Secondary | ICD-10-CM | POA: Diagnosis not present

## 2024-01-22 DIAGNOSIS — S72302D Unspecified fracture of shaft of left femur, subsequent encounter for closed fracture with routine healing: Secondary | ICD-10-CM | POA: Diagnosis not present

## 2024-01-23 DIAGNOSIS — D62 Acute posthemorrhagic anemia: Secondary | ICD-10-CM | POA: Diagnosis not present

## 2024-01-23 DIAGNOSIS — D696 Thrombocytopenia, unspecified: Secondary | ICD-10-CM | POA: Diagnosis not present

## 2024-01-23 DIAGNOSIS — D509 Iron deficiency anemia, unspecified: Secondary | ICD-10-CM | POA: Diagnosis not present

## 2024-01-23 DIAGNOSIS — S79192D Other physeal fracture of lower end of left femur, subsequent encounter for fracture with routine healing: Secondary | ICD-10-CM | POA: Diagnosis not present

## 2024-01-23 DIAGNOSIS — G40219 Localization-related (focal) (partial) symptomatic epilepsy and epileptic syndromes with complex partial seizures, intractable, without status epilepticus: Secondary | ICD-10-CM | POA: Diagnosis not present

## 2024-01-23 DIAGNOSIS — G40834 Dravet syndrome, intractable, without status epilepticus: Secondary | ICD-10-CM | POA: Diagnosis not present

## 2024-01-24 DIAGNOSIS — G40219 Localization-related (focal) (partial) symptomatic epilepsy and epileptic syndromes with complex partial seizures, intractable, without status epilepticus: Secondary | ICD-10-CM | POA: Diagnosis not present

## 2024-01-24 DIAGNOSIS — G40834 Dravet syndrome, intractable, without status epilepticus: Secondary | ICD-10-CM | POA: Diagnosis not present

## 2024-01-24 DIAGNOSIS — D696 Thrombocytopenia, unspecified: Secondary | ICD-10-CM | POA: Diagnosis not present

## 2024-01-24 DIAGNOSIS — D62 Acute posthemorrhagic anemia: Secondary | ICD-10-CM | POA: Diagnosis not present

## 2024-01-24 DIAGNOSIS — S79192D Other physeal fracture of lower end of left femur, subsequent encounter for fracture with routine healing: Secondary | ICD-10-CM | POA: Diagnosis not present

## 2024-01-24 DIAGNOSIS — D509 Iron deficiency anemia, unspecified: Secondary | ICD-10-CM | POA: Diagnosis not present

## 2024-01-29 DIAGNOSIS — G40834 Dravet syndrome, intractable, without status epilepticus: Secondary | ICD-10-CM | POA: Diagnosis not present

## 2024-01-29 DIAGNOSIS — D509 Iron deficiency anemia, unspecified: Secondary | ICD-10-CM | POA: Diagnosis not present

## 2024-01-29 DIAGNOSIS — S79192D Other physeal fracture of lower end of left femur, subsequent encounter for fracture with routine healing: Secondary | ICD-10-CM | POA: Diagnosis not present

## 2024-01-29 DIAGNOSIS — D62 Acute posthemorrhagic anemia: Secondary | ICD-10-CM | POA: Diagnosis not present

## 2024-01-29 DIAGNOSIS — D696 Thrombocytopenia, unspecified: Secondary | ICD-10-CM | POA: Diagnosis not present

## 2024-01-29 DIAGNOSIS — G40219 Localization-related (focal) (partial) symptomatic epilepsy and epileptic syndromes with complex partial seizures, intractable, without status epilepticus: Secondary | ICD-10-CM | POA: Diagnosis not present

## 2024-01-30 DIAGNOSIS — S79192D Other physeal fracture of lower end of left femur, subsequent encounter for fracture with routine healing: Secondary | ICD-10-CM | POA: Diagnosis not present

## 2024-01-30 DIAGNOSIS — D509 Iron deficiency anemia, unspecified: Secondary | ICD-10-CM | POA: Diagnosis not present

## 2024-01-30 DIAGNOSIS — D696 Thrombocytopenia, unspecified: Secondary | ICD-10-CM | POA: Diagnosis not present

## 2024-01-30 DIAGNOSIS — G40834 Dravet syndrome, intractable, without status epilepticus: Secondary | ICD-10-CM | POA: Diagnosis not present

## 2024-01-30 DIAGNOSIS — G40219 Localization-related (focal) (partial) symptomatic epilepsy and epileptic syndromes with complex partial seizures, intractable, without status epilepticus: Secondary | ICD-10-CM | POA: Diagnosis not present

## 2024-01-30 DIAGNOSIS — D62 Acute posthemorrhagic anemia: Secondary | ICD-10-CM | POA: Diagnosis not present

## 2024-01-31 DIAGNOSIS — D696 Thrombocytopenia, unspecified: Secondary | ICD-10-CM | POA: Diagnosis not present

## 2024-01-31 DIAGNOSIS — D62 Acute posthemorrhagic anemia: Secondary | ICD-10-CM | POA: Diagnosis not present

## 2024-01-31 DIAGNOSIS — G40834 Dravet syndrome, intractable, without status epilepticus: Secondary | ICD-10-CM | POA: Diagnosis not present

## 2024-01-31 DIAGNOSIS — G40219 Localization-related (focal) (partial) symptomatic epilepsy and epileptic syndromes with complex partial seizures, intractable, without status epilepticus: Secondary | ICD-10-CM | POA: Diagnosis not present

## 2024-01-31 DIAGNOSIS — S79192D Other physeal fracture of lower end of left femur, subsequent encounter for fracture with routine healing: Secondary | ICD-10-CM | POA: Diagnosis not present

## 2024-01-31 DIAGNOSIS — D509 Iron deficiency anemia, unspecified: Secondary | ICD-10-CM | POA: Diagnosis not present

## 2024-02-05 DIAGNOSIS — D62 Acute posthemorrhagic anemia: Secondary | ICD-10-CM | POA: Diagnosis not present

## 2024-02-05 DIAGNOSIS — G40834 Dravet syndrome, intractable, without status epilepticus: Secondary | ICD-10-CM | POA: Diagnosis not present

## 2024-02-05 DIAGNOSIS — G40219 Localization-related (focal) (partial) symptomatic epilepsy and epileptic syndromes with complex partial seizures, intractable, without status epilepticus: Secondary | ICD-10-CM | POA: Diagnosis not present

## 2024-02-05 DIAGNOSIS — S79192D Other physeal fracture of lower end of left femur, subsequent encounter for fracture with routine healing: Secondary | ICD-10-CM | POA: Diagnosis not present

## 2024-02-05 DIAGNOSIS — D696 Thrombocytopenia, unspecified: Secondary | ICD-10-CM | POA: Diagnosis not present

## 2024-02-05 DIAGNOSIS — D509 Iron deficiency anemia, unspecified: Secondary | ICD-10-CM | POA: Diagnosis not present

## 2024-02-06 DIAGNOSIS — I890 Lymphedema, not elsewhere classified: Secondary | ICD-10-CM | POA: Diagnosis not present

## 2024-02-06 DIAGNOSIS — N3090 Cystitis, unspecified without hematuria: Secondary | ICD-10-CM | POA: Diagnosis not present

## 2024-02-06 DIAGNOSIS — G40834 Dravet syndrome, intractable, without status epilepticus: Secondary | ICD-10-CM | POA: Diagnosis not present

## 2024-02-06 DIAGNOSIS — M818 Other osteoporosis without current pathological fracture: Secondary | ICD-10-CM | POA: Diagnosis not present

## 2024-02-08 DIAGNOSIS — M818 Other osteoporosis without current pathological fracture: Secondary | ICD-10-CM | POA: Diagnosis not present

## 2024-02-08 DIAGNOSIS — D62 Acute posthemorrhagic anemia: Secondary | ICD-10-CM | POA: Diagnosis not present

## 2024-02-08 DIAGNOSIS — K5909 Other constipation: Secondary | ICD-10-CM | POA: Diagnosis not present

## 2024-02-08 DIAGNOSIS — S7292XA Unspecified fracture of left femur, initial encounter for closed fracture: Secondary | ICD-10-CM | POA: Diagnosis not present

## 2024-02-08 DIAGNOSIS — D72829 Elevated white blood cell count, unspecified: Secondary | ICD-10-CM | POA: Diagnosis not present

## 2024-02-08 DIAGNOSIS — Z993 Dependence on wheelchair: Secondary | ICD-10-CM | POA: Diagnosis not present

## 2024-02-08 DIAGNOSIS — F79 Unspecified intellectual disabilities: Secondary | ICD-10-CM | POA: Diagnosis not present

## 2024-02-08 DIAGNOSIS — Z23 Encounter for immunization: Secondary | ICD-10-CM | POA: Diagnosis not present

## 2024-02-08 DIAGNOSIS — Z87440 Personal history of urinary (tract) infections: Secondary | ICD-10-CM | POA: Diagnosis not present

## 2024-02-08 DIAGNOSIS — S79192D Other physeal fracture of lower end of left femur, subsequent encounter for fracture with routine healing: Secondary | ICD-10-CM | POA: Diagnosis not present

## 2024-02-08 DIAGNOSIS — Z1321 Encounter for screening for nutritional disorder: Secondary | ICD-10-CM | POA: Diagnosis not present

## 2024-02-08 DIAGNOSIS — Z6825 Body mass index (BMI) 25.0-25.9, adult: Secondary | ICD-10-CM | POA: Diagnosis not present

## 2024-02-08 DIAGNOSIS — G40219 Localization-related (focal) (partial) symptomatic epilepsy and epileptic syndromes with complex partial seizures, intractable, without status epilepticus: Secondary | ICD-10-CM | POA: Diagnosis not present

## 2024-02-08 DIAGNOSIS — Z7982 Long term (current) use of aspirin: Secondary | ICD-10-CM | POA: Diagnosis not present

## 2024-02-08 DIAGNOSIS — M810 Age-related osteoporosis without current pathological fracture: Secondary | ICD-10-CM | POA: Diagnosis not present

## 2024-02-08 DIAGNOSIS — Z9181 History of falling: Secondary | ICD-10-CM | POA: Diagnosis not present

## 2024-02-08 DIAGNOSIS — G40834 Dravet syndrome, intractable, without status epilepticus: Secondary | ICD-10-CM | POA: Diagnosis not present

## 2024-02-08 DIAGNOSIS — S72492A Other fracture of lower end of left femur, initial encounter for closed fracture: Secondary | ICD-10-CM | POA: Diagnosis not present

## 2024-02-08 DIAGNOSIS — D509 Iron deficiency anemia, unspecified: Secondary | ICD-10-CM | POA: Diagnosis not present

## 2024-02-08 DIAGNOSIS — D696 Thrombocytopenia, unspecified: Secondary | ICD-10-CM | POA: Diagnosis not present

## 2024-02-08 DIAGNOSIS — S7292XS Unspecified fracture of left femur, sequela: Secondary | ICD-10-CM | POA: Diagnosis not present

## 2024-02-08 DIAGNOSIS — Z556 Problems related to health literacy: Secondary | ICD-10-CM | POA: Diagnosis not present

## 2024-02-08 DIAGNOSIS — I890 Lymphedema, not elsewhere classified: Secondary | ICD-10-CM | POA: Diagnosis not present

## 2024-02-11 DIAGNOSIS — D62 Acute posthemorrhagic anemia: Secondary | ICD-10-CM | POA: Diagnosis not present

## 2024-02-11 DIAGNOSIS — G40834 Dravet syndrome, intractable, without status epilepticus: Secondary | ICD-10-CM | POA: Diagnosis not present

## 2024-02-11 DIAGNOSIS — G40219 Localization-related (focal) (partial) symptomatic epilepsy and epileptic syndromes with complex partial seizures, intractable, without status epilepticus: Secondary | ICD-10-CM | POA: Diagnosis not present

## 2024-02-11 DIAGNOSIS — D509 Iron deficiency anemia, unspecified: Secondary | ICD-10-CM | POA: Diagnosis not present

## 2024-02-11 DIAGNOSIS — D696 Thrombocytopenia, unspecified: Secondary | ICD-10-CM | POA: Diagnosis not present

## 2024-02-11 DIAGNOSIS — S79192D Other physeal fracture of lower end of left femur, subsequent encounter for fracture with routine healing: Secondary | ICD-10-CM | POA: Diagnosis not present

## 2024-02-19 DIAGNOSIS — R933 Abnormal findings on diagnostic imaging of other parts of digestive tract: Secondary | ICD-10-CM | POA: Diagnosis not present

## 2024-02-20 DIAGNOSIS — G40834 Dravet syndrome, intractable, without status epilepticus: Secondary | ICD-10-CM | POA: Diagnosis not present

## 2024-02-20 DIAGNOSIS — G40219 Localization-related (focal) (partial) symptomatic epilepsy and epileptic syndromes with complex partial seizures, intractable, without status epilepticus: Secondary | ICD-10-CM | POA: Diagnosis not present

## 2024-02-20 DIAGNOSIS — D509 Iron deficiency anemia, unspecified: Secondary | ICD-10-CM | POA: Diagnosis not present

## 2024-02-20 DIAGNOSIS — D696 Thrombocytopenia, unspecified: Secondary | ICD-10-CM | POA: Diagnosis not present

## 2024-02-20 DIAGNOSIS — D62 Acute posthemorrhagic anemia: Secondary | ICD-10-CM | POA: Diagnosis not present

## 2024-02-20 DIAGNOSIS — S79192D Other physeal fracture of lower end of left femur, subsequent encounter for fracture with routine healing: Secondary | ICD-10-CM | POA: Diagnosis not present

## 2024-02-26 DIAGNOSIS — S72302D Unspecified fracture of shaft of left femur, subsequent encounter for closed fracture with routine healing: Secondary | ICD-10-CM | POA: Diagnosis not present

## 2024-02-28 DIAGNOSIS — G40219 Localization-related (focal) (partial) symptomatic epilepsy and epileptic syndromes with complex partial seizures, intractable, without status epilepticus: Secondary | ICD-10-CM | POA: Diagnosis not present

## 2024-02-28 DIAGNOSIS — D62 Acute posthemorrhagic anemia: Secondary | ICD-10-CM | POA: Diagnosis not present

## 2024-02-28 DIAGNOSIS — R933 Abnormal findings on diagnostic imaging of other parts of digestive tract: Secondary | ICD-10-CM | POA: Diagnosis not present

## 2024-02-28 DIAGNOSIS — G40834 Dravet syndrome, intractable, without status epilepticus: Secondary | ICD-10-CM | POA: Diagnosis not present

## 2024-02-28 DIAGNOSIS — D509 Iron deficiency anemia, unspecified: Secondary | ICD-10-CM | POA: Diagnosis not present

## 2024-02-28 DIAGNOSIS — S79192D Other physeal fracture of lower end of left femur, subsequent encounter for fracture with routine healing: Secondary | ICD-10-CM | POA: Diagnosis not present

## 2024-02-28 DIAGNOSIS — D696 Thrombocytopenia, unspecified: Secondary | ICD-10-CM | POA: Diagnosis not present

## 2024-03-17 DIAGNOSIS — Z151 Genetic susceptibility to epilepsy and neurodevelopmental disorders: Secondary | ICD-10-CM | POA: Diagnosis not present

## 2024-03-17 DIAGNOSIS — G40834 Dravet syndrome, intractable, without status epilepticus: Secondary | ICD-10-CM | POA: Diagnosis not present

## 2024-03-24 ENCOUNTER — Ambulatory Visit: Admitting: Physical Therapy

## 2024-03-24 DIAGNOSIS — R2689 Other abnormalities of gait and mobility: Secondary | ICD-10-CM | POA: Insufficient documentation

## 2024-03-24 DIAGNOSIS — R2681 Unsteadiness on feet: Secondary | ICD-10-CM | POA: Insufficient documentation

## 2024-03-24 DIAGNOSIS — R29898 Other symptoms and signs involving the musculoskeletal system: Secondary | ICD-10-CM | POA: Diagnosis present

## 2024-03-24 DIAGNOSIS — M6281 Muscle weakness (generalized): Secondary | ICD-10-CM | POA: Diagnosis present

## 2024-03-24 NOTE — Therapy (Unsigned)
 OUTPATIENT PHYSICAL THERAPY NEURO EVALUATION   Patient Name: Molly SHINGLEDECKER MRN: [id number] DOB:[birth year], 53 y.o., female Today's Date: 03/25/2024   PCP: Toribio Jerel MATSU., MD REFERRING PROVIDER: Danton Lauraine LABOR, PA-C  END OF SESSION:  PT End of Session - 03/25/24 1851     Visit Number 1    Number of Visits 24    Date for Recertification  05/23/24    Authorization Type Medicare/ [employer]    Authorization Time Period 03-24-24 - 05-30-24    PT Start Time 1102    PT Stop Time 1155    PT Time Calculation (min) 53 min    Equipment Utilized During Treatment Other (comment)   pt wearing transfer vest (no gait belt needed)   Activity Tolerance Patient tolerated treatment well    Behavior During Therapy [facility] for tasks assessed/performed          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   Past Surgical History:  Procedure Laterality Date   dental implants     ORIF FEMUR FRACTURE Left 01/02/2024   Procedure: OPEN REDUCTION INTERNAL FIXATION (ORIF) DISTAL FEMUR FRACTURE;  Surgeon: Kendal Franky SQUIBB, MD;  Location: [facility] OR;  Service: Orthopedics;  Laterality: Left;   vns implant  2001   has been turned off   Patient Active Problem List   Diagnosis Date Noted   Femur fracture, left (HCC) 12/30/2023   Acute cystitis 02/28/2019   Acute vaginitis 02/28/2019   Irregular periods 02/28/2019   Menopausal syndrome 02/28/2019   Mental disability 02/28/2019   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   Partial epilepsy with impairment of consciousness, 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    ONSET DATE: 12-30-23  REFERRING DIAG:    L Distal Femur fracture - s/p ORIF    THERAPY DIAG:  Other abnormalities of gait and mobility  Muscle weakness (generalized)  Other symptoms and signs involving the musculoskeletal system  Unsteadiness on feet  Rationale for Evaluation and  Treatment: Rehabilitation  SUBJECTIVE:                                                                                                                                                                                             SUBJECTIVE STATEMENT: Pt presents to PT eval in transport manual wheelchair; accompanied by her parents.  Mother reports pt fell when leaving a restaurant on 12-30-23 and fractured her Lt distal femur (admitted to [facility]).  Pt underwent ORIF Lt femur on 01-02-24. Pt was discharged home  on 01-07-24. Had HHPT but did not ambulate with PT. Currently not doing any specific exercises.  Pt did not use assistive device prior to fall - is wearing a transfer vest with handles that parent/ caregiver held for assistance with ambulation Has a PCA that comes 5 days/week for 5-8 hours/day Pt accompanied by: parents - Alm & Debbie  PERTINENT HISTORY: Dravet's syndrome, seizures, developmental delay; s/p Lt ORIF distal femur 01-02-24, osteoporosis, lymphedema LLE > RLE  PAIN:  Are you having pain? No pain in sitting; pain in standing   PRECAUTIONS: Fall  RED FLAGS: None   WEIGHT BEARING RESTRICTIONS: No  FALLS: Has patient fallen in last 6 months? Yes. Number of falls 1 - on 12-30-23  LIVING ENVIRONMENT: Lives with: lives with their family Lives in: House/apartment Stairs: No Has following equipment at home: Wheelchair (manual) and tub transfer bench   PLOF: Needs assistance with ADLs, Needs assistance with gait, and Needs assistance with transfers  PATIENT GOALS: mother's goal I want her to walk again  OBJECTIVE:  Note: Objective measures were completed at Evaluation unless otherwise noted.  DIAGNOSTIC FINDINGS: N/A  COGNITION: Overall cognitive status: Impaired and dev. delayed   SENSATION: WFL  COORDINATION: NT due to cognitive deficits  EDEMA:  Lymphedema in bil. LE's with LLE>RLE  POSTURE: rounded shoulders, forward head, and increased  thoracic kyphosis  LOWER EXTREMITY ROM:   WFL's   LOWER EXTREMITY MMT:  unable to accurately assess due to cognitive deficits; pt able to flex Rt and Lt hips against gravity   BED MOBILITY:  Findings: Sit to supine CGA Supine to sit SBA  TRANSFERS: Sit to stand: Mod A  Assistive device utilized: Environmental Consultant - 2 wheeled     Stand to sit: Modified independence and Mod A  Assistive device utilized: Environmental Consultant - 2 wheeled      RAMP:  Not tested  CURB: N/A  STAIRS: Not tested  GAIT: Findings: Gait Characteristics: step to pattern, decreased stance time- Left, decreased hip/knee flexion- Right, and antalgic, Distance walked: 10' x 2 reps inside // bars; 32' with RW, Assistive device utilized:Walker - 2 wheeled and // bars for initial gait assessment, Level of assistance: Mod A, and Comments: pt amb. Inside // bars 10' x 2 reps initially, then trialed use of RW  FUNCTIONAL TESTS:  Pt able to sit unsupported on side of mat; able to stand with bil. UE support with min assist                                                                                                                                TREATMENT DATE: 03-24-24  Gait:  pt gait trained with RW 32' with mod assist    PATIENT EDUCATION: Education details: instructed parents to have pt stand at counter and perform weight shifting laterally Person educated: Parent Education method: Explanation Education comprehension: verbalized understanding  HOME EXERCISE PROGRAM: To be established  GOALS: Goals reviewed  with patient? Yes  SHORT TERM GOALS: Target date: 04-25-24  Pt will transfer wheelchair to/from mat with min to mod assist. Baseline: max to mod assist Goal status: INITIAL  2.  Pt will stand at counter for at least 2 with UE support with min assist for increased independence with ADL's. Baseline: currently not standing for period of time Goal status: INITIAL  3.  Perform bed mobility with CGA. Baseline:  Goal  status: INITIAL  4.  Amb. 65' with RW with +1 min assist for increased household accessibility.  Baseline:  Goal status: INITIAL  5.  Caregiver will demonstrate/report understanding of HEP for LLE strengthening and ROM. Baseline:  Goal status: INITIAL  6.  Initiate manual wheelchair eval with [employer]. Baseline:  Goal status: INITIAL   LONG TERM GOALS: Target date: 05-24-23  Pt will transfer wheelchair to/from mat with min to CGA without holding on to caregiver/PT during transfer. Baseline: max to mod assist Goal status: INITIAL  2.  Pt will stand at counter for at least 5 with UE support with min assist for increased independence with ADL's. Baseline: currently not standing for period of time           Goal status: INITIAL  3.  Amb. 115' with RW with min assist for increased community accessbility Baseline:  Goal status: INITIAL  4.  Amb. 30' without device with min assist from caregiver for increased household amb.  Baseline:  Goal status: INITIAL  5.  Perform sit to stand transfer with SBA for decreased burden of care on caregiver.   Baseline:  Goal status: INITIAL  6.  Caregiver will be independent with updated HEP for LE strengthening and ROM. Baseline:  Goal status: INITIAL  ASSESSMENT:  CLINICAL IMPRESSION: Patient is a 52 y.o. lady who was seen today for physical therapy evaluation and treatment for s/p ORIF Lt femur due to fall sustained on 12-30-23.  Pt is currently not ambulating at home; pt gait trained with RW 32' in today's eval session with mod to min assist.  Pt requires mod to max assist for stand pivot transfers and is currently unable to stand for a period of time.  Accurate MMT unable to be performed due to pt's cognitive deficits.  Pt demonstrates ability to lift LLE against gravity.  Pt will benefit from skilled PT to address functional mobility deficits including transfers and gait, LLE weakness, and standing balance impairments.  OBJECTIVE  IMPAIRMENTS: Abnormal gait, decreased activity tolerance, decreased balance, decreased cognition, decreased ROM, decreased strength, and increased edema.   ACTIVITY LIMITATIONS: carrying, lifting, bending, standing, squatting, stairs, transfers, bed mobility, bathing, toileting, hygiene/grooming, and locomotion level  PARTICIPATION LIMITATIONS: meal prep, cleaning, laundry, shopping, and community activity  PERSONAL FACTORS: Behavior pattern, Past/current experiences, and 1-2 comorbidities: osteoporosis & s/p ORIF Lt femur are also affecting patient's functional outcome.   REHAB POTENTIAL: Excellent  CLINICAL DECISION MAKING: Evolving/moderate complexity  EVALUATION COMPLEXITY: Moderate  PLAN:  PT FREQUENCY: 3x/week  PT DURATION: 8 weeks  PLANNED INTERVENTIONS: 97110-Therapeutic exercises, 97530- Therapeutic activity, [id number]- Neuromuscular re-education, 97535- Self Care, [id number]- Gait training, and Patient/Family education  PLAN FOR NEXT SESSION: issue HEP; gait train with RW;  assess standing tolerance. [employer]   Roxanna [place], PT 03/25/2024, 6:59 PM

## 2024-03-25 ENCOUNTER — Encounter: Payer: Self-pay | Admitting: Physical Therapy

## 2024-03-27 ENCOUNTER — Ambulatory Visit: Admitting: Physical Therapy

## 2024-03-27 DIAGNOSIS — R2689 Other abnormalities of gait and mobility: Secondary | ICD-10-CM | POA: Diagnosis not present

## 2024-03-27 DIAGNOSIS — M6281 Muscle weakness (generalized): Secondary | ICD-10-CM

## 2024-03-28 ENCOUNTER — Encounter: Payer: Self-pay | Admitting: Physical Therapy

## 2024-03-28 NOTE — Therapy (Signed)
 OUTPATIENT PHYSICAL THERAPY NEURO TREATMENT NOTE   Patient Name: Molly Duffy MRN: [id number] DOB:[birth year], 53 y.o., female Today's Date: 03/28/2024   PCP: Toribio Jerel MATSU., MD REFERRING PROVIDER: Danton Lauraine LABOR, PA-C  END OF SESSION:  PT End of Session - 03/28/24 1900     Visit Number 2    Number of Visits 24    Date for Recertification  05/23/24    Authorization Type Medicare/ [place]    Authorization Time Period 03-24-24 - 05-30-24    PT Start Time 1620    PT Stop Time 1702    PT Time Calculation (min) 42 min    Equipment Utilized During Treatment Gait belt    Activity Tolerance Patient tolerated treatment well    Behavior During Therapy WFL for tasks assessed/performed          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   Past Surgical History:  Procedure Laterality Date   dental implants     ORIF FEMUR FRACTURE Left 01/02/2024   Procedure: OPEN REDUCTION INTERNAL FIXATION (ORIF) DISTAL FEMUR FRACTURE;  Surgeon: Kendal Franky SQUIBB, MD;  Location: [facility] OR;  Service: Orthopedics;  Laterality: Left;   vns implant  2001   has been turned off   Patient Active Problem List   Diagnosis Date Noted   Femur fracture, left (HCC) 12/30/2023   Acute cystitis 02/28/2019   Acute vaginitis 02/28/2019   Irregular periods 02/28/2019   Menopausal syndrome 02/28/2019   Mental disability 02/28/2019   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   Partial epilepsy with impairment of consciousness, 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 Father states pt has been standing doing puzzles at home  Pt accompanied by: father and granddaughter  PERTINENT HISTORY: Dravet's syndrome, seizures, developmental delay; s/p Lt ORIF distal femur 01-02-24, osteoporosis, lymphedema LLE > RLE  PAIN:  Are you having pain? No pain  in sitting; pain in standing   PRECAUTIONS: Fall  RED FLAGS: None   WEIGHT BEARING RESTRICTIONS: No  FALLS: Has patient fallen in last 6 months? Yes. Number of falls 1 - on 12-30-23  LIVING ENVIRONMENT: Lives with: lives with their family Lives in: House/apartment Stairs: No Has following equipment at home: Wheelchair (manual) and tub transfer bench   PLOF: Needs assistance with ADLs, Needs assistance with gait, and Needs assistance with transfers  PATIENT GOALS: mother's goal I want her to walk again  OBJECTIVE:  Note: Objective measures were completed at Evaluation unless otherwise noted.  DIAGNOSTIC FINDINGS: N/A  COGNITION: Overall cognitive status: Impaired and dev. delayed   SENSATION: WFL  COORDINATION: NT due to cognitive deficits  EDEMA:  Lymphedema in bil. LE's with LLE>RLE  POSTURE: rounded shoulders, forward head, and increased thoracic kyphosis  LOWER EXTREMITY ROM:   WFL's   LOWER EXTREMITY MMT:  unable to accurately assess due to cognitive deficits; pt able to flex Rt and Lt hips against gravity   BED MOBILITY:  Findings: Sit to supine CGA Supine to sit SBA  TRANSFERS: Sit to stand: Mod A  Assistive device utilized: Environmental Consultant - 2 wheeled     Stand to sit: Modified independence and Mod A  Assistive device utilized: Environmental Consultant - 2 wheeled      RAMP:  Not tested  CURB: N/A  STAIRS: Not tested  GAIT: Findings: Gait Characteristics: step  to pattern, decreased stance time- Left, decreased hip/knee flexion- Right, and antalgic, Distance walked: 10' x 2 reps inside // bars; 32' with RW, Assistive device utilized:Walker - 2 wheeled and // bars for initial gait assessment, Level of assistance: Mod A, and Comments: pt amb. Inside // bars 10' x 2 reps initially, then trialed use of RW  FUNCTIONAL TESTS:  Pt able to sit unsupported on side of mat; able to stand with bil. UE support with min assist                                                                                                                                TREATMENT DATE: 03-24-24  Pt transferred from wheelchair to mat toward Rt side using stand pivot transfer with mod assist  TherEx;  LLE strengthening exercises:   Access Code: [email or web address] URL: [email or web address] Date: 03/28/2024 Prepared by: Rock Kussmaul  Exercises - Supine Bridge  - 1 x daily - 7 x weekly - 1 sets - 10 reps - Straight Leg Raise  - 1 x daily - 7 x weekly - 1 sets - 10 reps - Hip flexion in hooklying (BOTH KNEES BENT)  - 1 x daily - 7 x weekly - 1 sets - 10 reps - Seated Long Arc Quad  - 1 x daily - 7 x weekly - 1 sets - 10 reps - Sit to Stand with Counter Support  - 1 x daily - 7 x weekly - 1 sets - 10 reps  SciFit - level 1.0 x 7 with bil. UE's and LE's for strengthening and LLE ROM  Gait:  pt gait trained with RW 74' with mod to min assist; cues for correct hand placement  Gait trained from SciFit to other side of gym approx. 75' with RW with min assist  PATIENT EDUCATION: Education details: instructed parents to have pt stand at counter and perform weight shifting laterally Person educated: Parent Education method: Explanation Education comprehension: verbalized understanding  HOME EXERCISE PROGRAM: To be established  GOALS: Goals reviewed with patient? Yes  SHORT TERM GOALS: Target date: 04-25-24  Pt will transfer wheelchair to/from mat with min to mod  assist. Baseline: max to mod assist Goal status: INITIAL  2.  Pt will stand at counter for at least 2 with UE support with min assist for increased independence with ADL's. Baseline: currently not standing for period of time Goal status: INITIAL  3.  Perform bed mobility with CGA. Baseline:  Goal status: INITIAL  4.  Amb. 93' with RW with +1 min assist for increased household accessibility.  Baseline:  Goal status: INITIAL  5.  Caregiver will demonstrate/report understanding of HEP for LLE strengthening and ROM. Baseline:  Goal status: INITIAL  6.  Initiate manual wheelchair eval with [employer]. Baseline:  Goal status: INITIAL   LONG TERM GOALS: Target date: 05-24-23  Pt will transfer wheelchair to/from mat with min to CGA without holding on to caregiver/PT during transfer. Baseline: max to mod assist Goal status: INITIAL  2.  Pt will stand at counter for at least 5 with UE support with min assist for increased independence with ADL's. Baseline: currently not standing for period of time           Goal status: INITIAL  3.  Amb. 115' with RW with min assist for increased community accessbility Baseline:  Goal status: INITIAL  4.  Amb. 30' without device with min assist from caregiver for increased household amb.  Baseline:  Goal status: INITIAL  5.  Perform sit to stand transfer with SBA for decreased burden of care on caregiver.   Baseline:  Goal status: INITIAL  6.  Caregiver will be independent with updated HEP for LE strengthening and ROM. Baseline:  Goal status: INITIAL  ASSESSMENT:  CLINICAL IMPRESSION: PT session focused on establishing HEP for LLE strengthening and ROM and gait training with RW.  Pt ambulated 68' on 1st rep of gait training with RW with mod to min assist and approx. 38' on 2nd rep.  This distance increased from 14' with RW during initia eval session on 03-24-24.  Pt had difficulty performing SLR with Lt knee extended.  Pt tolerated exercises  well.  Cont with POC.   OBJECTIVE IMPAIRMENTS: Abnormal gait, decreased activity tolerance, decreased balance, decreased cognition, decreased ROM, decreased strength, and increased edema.   ACTIVITY LIMITATIONS: carrying, lifting, bending, standing, squatting, stairs, transfers, bed mobility, bathing, toileting, hygiene/grooming, and locomotion level  PARTICIPATION LIMITATIONS: meal prep, cleaning, laundry, shopping, and community activity  PERSONAL FACTORS: Behavior pattern, Past/current experiences, and 1-2 comorbidities: osteoporosis & s/p ORIF Lt femur are also affecting patient's functional outcome.   REHAB POTENTIAL: Excellent  CLINICAL DECISION MAKING: Evolving/moderate complexity  EVALUATION COMPLEXITY: Moderate  PLAN:  PT FREQUENCY: 3x/week  PT DURATION: 8 weeks  PLANNED INTERVENTIONS: 97110-Therapeutic exercises, 97530- Therapeutic activity, [id number]- Neuromuscular re-education, 97535- Self Care, [id number]- Gait training, and Patient/Family education  PLAN FOR NEXT SESSION: gait train with RW;  assess standing tolerance. SciFit W/c eval scheduled for  04-24-24  Roxanna Rock Area, PT 03/28/2024, 7:05 PM

## 2024-04-03 ENCOUNTER — Ambulatory Visit: Admitting: Physical Therapy

## 2024-04-03 DIAGNOSIS — M6281 Muscle weakness (generalized): Secondary | ICD-10-CM

## 2024-04-03 DIAGNOSIS — R2689 Other abnormalities of gait and mobility: Secondary | ICD-10-CM | POA: Diagnosis not present

## 2024-04-03 DIAGNOSIS — R2681 Unsteadiness on feet: Secondary | ICD-10-CM

## 2024-04-03 NOTE — Therapy (Unsigned)
 OUTPATIENT PHYSICAL THERAPY NEURO TREATMENT NOTE   Patient Name: Molly Duffy MRN: [id number] DOB:1970-08-09, 53 y.o., female Today's Date: 04/03/2024   PCP: Toribio Jerel MATSU., MD REFERRING PROVIDER: Danton Lauraine LABOR, PA-C  END OF SESSION:    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   Past Surgical History:  Procedure Laterality Date   dental implants     ORIF FEMUR FRACTURE Left 01/02/2024   Procedure: OPEN REDUCTION INTERNAL FIXATION (ORIF) DISTAL FEMUR FRACTURE;  Surgeon: Kendal Franky SQUIBB, MD;  Location: [facility] OR;  Service: Orthopedics;  Laterality: Left;   vns implant  2001   has been turned off   Patient Active Problem List   Diagnosis Date Noted   Femur fracture, left (HCC) 12/30/2023   Acute cystitis 02/28/2019   Acute vaginitis 02/28/2019   Irregular periods 02/28/2019   Menopausal syndrome 02/28/2019   Mental disability 02/28/2019   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  Rationale for Evaluation and Treatment: Rehabilitation  SUBJECTIVE:                                                                                                                                                                                             SUBJECTIVE STATEMENT: Pt's father reports pt was a little sore the following day after initial eval on Monday - was given Tylenol  and was fine. Father states pt has been standing doing puzzles at home  Pt accompanied by: father and granddaughter  PERTINENT HISTORY: Dravet's syndrome, seizures, developmental delay; s/p Lt ORIF distal femur 01-02-24, osteoporosis, lymphedema LLE > RLE  PAIN:  Are you having pain? No  pain in sitting; pain in standing   PRECAUTIONS: Fall  RED FLAGS: None   WEIGHT BEARING RESTRICTIONS: No  FALLS: Has patient fallen in last 6 months? Yes. Number of falls 1 - on 12-30-23  LIVING ENVIRONMENT: Lives with: lives with their family Lives in: House/apartment Stairs: No Has following equipment at home: Wheelchair (manual) and tub transfer bench   PLOF: Needs assistance with ADLs, Needs assistance with gait, and Needs assistance with transfers  PATIENT GOALS: mother's goal I want her to walk again  OBJECTIVE:  Note: Objective measures were completed at Evaluation unless otherwise noted.  DIAGNOSTIC FINDINGS: N/A  COGNITION: Overall cognitive status:  Impaired and dev. delayed   SENSATION: WFL  COORDINATION: NT due to cognitive deficits  EDEMA:  Lymphedema in bil. LE's with LLE>RLE  POSTURE: rounded shoulders, forward head, and increased thoracic kyphosis  LOWER EXTREMITY ROM:   WFL's   LOWER EXTREMITY MMT:  unable to accurately assess due to cognitive deficits; pt able to flex Rt and Lt hips against gravity   BED MOBILITY:  Findings: Sit to supine CGA Supine to sit SBA  TRANSFERS: Sit to stand: Mod A  Assistive device utilized: Environmental Consultant - 2 wheeled     Stand to sit: Modified independence and Mod A  Assistive device utilized: Environmental Consultant - 2 wheeled      RAMP:  Not tested  CURB: N/A  STAIRS: Not tested  GAIT: Findings: Gait Characteristics: step to pattern, decreased stance time- Left, decreased hip/knee flexion- Right, and antalgic, Distance walked: 10' x 2 reps inside // bars; 32' with RW, Assistive device utilized:Walker - 2 wheeled and // bars for initial gait assessment, Level of assistance: Mod A, and Comments: pt amb. Inside // bars 10' x 2 reps initially, then trialed use of RW  FUNCTIONAL TESTS:  Pt able to sit unsupported on side of mat; able to stand with bil. UE support with min assist                                                                                                                                 TREATMENT DATE: 03-24-24  Pt transferred from wheelchair to mat toward Rt side using stand pivot transfer with mod assist  TherEx;  LLE strengthening exercises:   Access Code: [email or web address] URL: [email or web address] Date: 03/28/2024 Prepared by: Rock Kussmaul  Exercises - Supine Bridge  - 1 x daily - 7 x weekly - 1 sets - 10 reps - Straight Leg Raise  - 1 x daily - 7 x weekly - 1 sets - 10 reps - Hip flexion in hooklying (BOTH KNEES BENT)  - 1 x daily - 7 x weekly - 1 sets - 10 reps - Seated Long Arc Quad  - 1 x daily - 7 x weekly - 1 sets - 10 reps - Sit to Stand with Counter Support  - 1 x daily - 7 x weekly - 1 sets - 10 reps  SciFit - level 1.0 x 7 with bil. UE's and LE's for strengthening and LLE ROM  Gait:  pt gait trained with RW 39' with mod to min assist; cues for correct hand placement  Gait trained from SciFit to other side of gym approx. 62' with RW with min assist  PATIENT EDUCATION: Education details: instructed parents to have pt stand at counter and perform weight shifting laterally Person educated: Parent Education method: Explanation Education comprehension: verbalized understanding  HOME EXERCISE PROGRAM: To be established  GOALS: Goals reviewed with patient? Yes  SHORT TERM GOALS: Target date: 04-25-24  Pt will transfer wheelchair to/from mat with min  to mod assist. Baseline: max to mod assist Goal status: INITIAL  2.  Pt will stand at counter for at least 2 with UE support with min assist for increased independence with ADL's. Baseline: currently not standing for period of time Goal status: INITIAL  3.  Perform bed mobility with CGA. Baseline:  Goal status: INITIAL  4.  Amb. 61' with RW with +1 min assist for increased household accessibility.  Baseline:  Goal status: INITIAL  5.  Caregiver will demonstrate/report understanding of HEP for LLE strengthening  and ROM. Baseline:  Goal status: INITIAL  6.  Initiate manual wheelchair eval with [employer]. Baseline:  Goal status: INITIAL   LONG TERM GOALS: Target date: 05-24-23  Pt will transfer wheelchair to/from mat with min to CGA without holding on to caregiver/PT during transfer. Baseline: max to mod assist Goal status: INITIAL  2.  Pt will stand at counter for at least 5 with UE support with min assist for increased independence with ADL's. Baseline: currently not standing for period of time           Goal status: INITIAL  3.  Amb. 115' with RW with min assist for increased community accessbility Baseline:  Goal status: INITIAL  4.  Amb. 30' without device with min assist from caregiver for increased household amb.  Baseline:  Goal status: INITIAL  5.  Perform sit to stand transfer with SBA for decreased burden of care on caregiver.   Baseline:  Goal status: INITIAL  6.  Caregiver will be independent with updated HEP for LE strengthening and ROM. Baseline:  Goal status: INITIAL  ASSESSMENT:  CLINICAL IMPRESSION: PT session focused on establishing HEP for LLE strengthening and ROM and gait training with RW.  Pt ambulated 65' on 1st rep of gait training with RW with mod to min assist and approx. 12' on 2nd rep.  This distance increased from 68' with RW during initia eval session on 03-24-24.  Pt had difficulty performing SLR with Lt knee extended.  Pt tolerated exercises well.  Cont with POC.   OBJECTIVE IMPAIRMENTS: Abnormal gait, decreased activity tolerance, decreased balance, decreased cognition, decreased ROM, decreased strength, and increased edema.   ACTIVITY LIMITATIONS: carrying, lifting, bending, standing, squatting, stairs, transfers, bed mobility, bathing, toileting, hygiene/grooming, and locomotion level  PARTICIPATION LIMITATIONS: meal prep, cleaning, laundry, shopping, and community activity  PERSONAL FACTORS: Behavior pattern, Past/current experiences, and 1-2  comorbidities: osteoporosis & s/p ORIF Lt femur are also affecting patient's functional outcome.   REHAB POTENTIAL: Excellent  CLINICAL DECISION MAKING: Evolving/moderate complexity  EVALUATION COMPLEXITY: Moderate  PLAN:  PT FREQUENCY: 3x/week  PT DURATION: 8 weeks  PLANNED INTERVENTIONS: 97110-Therapeutic exercises, 97530- Therapeutic activity, [id number]- Neuromuscular re-education, 97535- Self Care, [id number]- Gait training, and Patient/Family education  PLAN FOR NEXT SESSION: gait train with RW;  assess standing tolerance. SciFit W/c eval scheduled for 04-24-24  Roxanna Rock Area, PT 04/03/2024, 11:49 AM

## 2024-04-04 ENCOUNTER — Encounter: Payer: Self-pay | Admitting: Physical Therapy

## 2024-04-07 ENCOUNTER — Ambulatory Visit: Admitting: Physical Therapy

## 2024-04-15 ENCOUNTER — Ambulatory Visit: Admitting: Physical Therapy

## 2024-04-22 ENCOUNTER — Ambulatory Visit: Attending: Student | Admitting: Physical Therapy

## 2024-04-22 DIAGNOSIS — R2689 Other abnormalities of gait and mobility: Secondary | ICD-10-CM | POA: Insufficient documentation

## 2024-04-22 DIAGNOSIS — M6281 Muscle weakness (generalized): Secondary | ICD-10-CM | POA: Insufficient documentation

## 2024-04-22 DIAGNOSIS — R2681 Unsteadiness on feet: Secondary | ICD-10-CM | POA: Insufficient documentation

## 2024-04-22 NOTE — Therapy (Unsigned)
 " OUTPATIENT PHYSICAL THERAPY NEURO TREATMENT NOTE   Patient Name: Molly Duffy MRN: [id number] DOB:02/18/1971, 54 y.o., female Today's Date: 04/23/2024   PCP: Toribio Jerel MATSU., MD REFERRING PROVIDER: Danton Lauraine LABOR, PA-C  END OF SESSION:  PT End of Session - 04/23/24 2007     Visit Number 4    Number of Visits 24    Date for Recertification  05/23/24    Authorization Type Medicare/ [facility]    Authorization Time Period 03-24-24 - 05-30-24    PT Start Time 1535    PT Stop Time 1623    PT Time Calculation (min) 48 min    Equipment Utilized During Treatment Gait belt    Activity Tolerance Patient tolerated treatment well    Behavior During Therapy [facility] for tasks assessed/performed            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   Past Surgical History:  Procedure Laterality Date   dental implants     ORIF FEMUR FRACTURE Left 01/02/2024   Procedure: OPEN REDUCTION INTERNAL FIXATION (ORIF) DISTAL FEMUR FRACTURE;  Surgeon: Kendal Franky SQUIBB, MD;  Location: [facility] OR;  Service: Orthopedics;  Laterality: Left;   vns implant  2001   has been turned off   Patient Active Problem List   Diagnosis Date Noted   Femur fracture, left (HCC) 12/30/2023   Acute cystitis 02/28/2019   Acute vaginitis 02/28/2019   Irregular periods 02/28/2019   Menopausal syndrome 02/28/2019   Mental disability 02/28/2019   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   Partial epilepsy with impairment of consciousness, 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    ONSET DATE: 12-30-23  REFERRING DIAG:    L Distal Femur fracture - s/p ORIF    THERAPY DIAG:  Other abnormalities of gait and mobility  Muscle weakness (generalized)  Unsteadiness on feet  Rationale for Evaluation and Treatment: Rehabilitation  SUBJECTIVE:                                                                                                                                                                                              SUBJECTIVE STATEMENT: Pt presents to PT in manual wheelchair accompanied by father - he reports pt is doing much better with transfers at home - is able to stand up from her wheelchair with no assistance; has been watching videos and dancing at counter  Pt accompanied by: father and Maddie (niece)  PERTINENT HISTORY: Dravet's syndrome, seizures, developmental delay; s/p Lt ORIF  distal femur 01-02-24, osteoporosis, lymphedema LLE > RLE  PAIN:  Are you having pain? No pain in sitting; pain in standing   PRECAUTIONS: Fall  RED FLAGS: None   WEIGHT BEARING RESTRICTIONS: No  FALLS: Has patient fallen in last 6 months? Yes. Number of falls 1 - on 12-30-23  LIVING ENVIRONMENT: Lives with: lives with their family Lives in: House/apartment Stairs: No Has following equipment at home: Wheelchair (manual) and tub transfer bench   PLOF: Needs assistance with ADLs, Needs assistance with gait, and Needs assistance with transfers  PATIENT GOALS: mother's goal I want her to walk again  OBJECTIVE:  Note: Objective measures were completed at Evaluation unless otherwise noted.  DIAGNOSTIC FINDINGS: N/A  COGNITION: Overall cognitive status: Impaired and dev. delayed   SENSATION: WFL  COORDINATION: NT due to cognitive deficits  EDEMA:  Lymphedema in bil. LE's with LLE>RLE  POSTURE: rounded shoulders, forward head, and increased thoracic kyphosis  LOWER EXTREMITY ROM:   WFL's   LOWER EXTREMITY MMT:  unable to accurately assess due to cognitive deficits; pt able to flex Rt and Lt hips against gravity   BED MOBILITY:  Findings: Sit to supine CGA Supine to sit SBA  TRANSFERS: Sit to stand: Mod A  Assistive device utilized: Environmental Consultant - 2 wheeled     Stand to sit: Modified independence and Mod A  Assistive device utilized: Environmental Consultant - 2 wheeled      RAMP:  Not  tested  CURB: N/A  STAIRS: Not tested  GAIT: Findings: Gait Characteristics: step to pattern, decreased stance time- Left, decreased hip/knee flexion- Right, and antalgic, Distance walked: 10' x 2 reps inside // bars; 32' with RW, Assistive device utilized:Walker - 2 wheeled and // bars for initial gait assessment, Level of assistance: Mod A, and Comments: pt amb. Inside // bars 10' x 2 reps initially, then trialed use of RW  FUNCTIONAL TESTS:  Pt able to sit unsupported on side of mat; able to stand with bil. UE support with min assist                                                                                                                                TREATMENT DATE: 04-22-24  Pt transferred from wheelchair to mat toward Rt side using stand pivot transfer with CGA  TherEx;  LLE strengthening exercises:  Bridging 10 reps LLE SLR with min assist 10 reps - pt had difficulty maintaining knee extension Hip abduction in hooklying - LLE - 10 reps - minimal manual resistance with abdct. & with adduction Lt hip flexion/extension in hooklying 10 reps  SciFit level 2.5 x 8 1/2 with bil. UE's and LE's; level 1.5 x 1 1/2 for LE strengthening and AROM  TherAct:  Pt performed tap ups to 2 block inside // bars with UE support - 10 reps RLE for increased weight bearing on LLE; 10 reps LLE onto 2 block Progressed to 4 block -  tap ups with RLE x 5 reps for increased LLE weight bearing; 5 reps LLE to 4 step  Gait: Pt gait trained with RW 135' with min assist- occasional mod assist for negotiation of RW - cues to stand erect Pt gait trained 10' x 2 reps inside // bars with LUE support - RUE support on PT's forearm - attempting less UE support with amb. But pt reported discomfort in LLE  Pt turned around inside // bars with bil. UE support and amb. Back to wheelchair at other end of // bars    Access Code: [email or web address] URL: [email or web address] Date: 03/28/2024 Prepared  by: Rock Kussmaul  Exercises - Supine Bridge  - 1 x daily - 7 x weekly - 1 sets - 10 reps - Straight Leg Raise  - 1 x daily - 7 x weekly - 1 sets - 10 reps - Hip flexion in hooklying (BOTH KNEES BENT)  - 1 x daily - 7 x weekly - 1 sets - 10 reps - Seated Long Arc Quad  - 1 x daily - 7 x weekly - 1 sets - 10 reps - Sit to Stand with Counter Support  - 1 x daily - 7 x weekly - 1 sets - 10 reps  SciFit - level 1.0 x 7 with bil. UE's and LE's for strengthening and LLE ROM  Gait:  pt gait trained with RW 24' with mod to min assist; cues for correct hand placement  Gait trained from SciFit to other side of gym approx. 20' with RW with min assist  PATIENT EDUCATION: Education details: instructed parents to have pt stand at counter and perform weight shifting laterally Person educated: Parent Education method: Explanation Education comprehension: verbalized understanding  HOME EXERCISE PROGRAM: To be established  GOALS: Goals reviewed with patient? Yes  SHORT TERM GOALS: Target date: 04-25-24  Pt will transfer wheelchair to/from mat with min to mod assist. Baseline: max to mod assist Goal status: Goal met 04-22-24  2.  Pt will stand at counter for at least 2 with UE support with min assist for increased independence with ADL's. Baseline: currently not standing for period of time Goal status: Met per father's report - 04-22-24  3.  Perform bed mobility with CGA. Baseline:  Goal status: Met 04-22-24  4.  Amb. 109' with RW with +1 min assist for increased household accessibility.  Baseline:  Goal status: Met 04-22-24  5.  Caregiver will demonstrate/report understanding of HEP for LLE strengthening and ROM. Baseline:  Goal status: Met 04-22-24  6.  Initiate manual wheelchair eval with [employer]. Baseline:  Goal status: INITIAL   LONG TERM GOALS: Target date: 05-24-23  Pt will transfer wheelchair to/from mat with min to CGA without holding on to caregiver/PT during transfer. Baseline:  max to mod assist Goal status: INITIAL  2.  Pt will stand at counter for at least 5 with UE support with min assist for increased independence with ADL's. Baseline: currently not standing for period of time           Goal status: INITIAL  3.  Amb. 115' with RW with min assist for increased community accessbility Baseline:  Goal status: INITIAL  4.  Amb. 30' without device with min assist from caregiver for increased household amb.  Baseline:  Goal status: INITIAL  5.  Perform sit to stand transfer with SBA for decreased burden of care on caregiver.   Baseline:  Goal status: INITIAL  6.  Caregiver will be independent with updated  HEP for LE strengthening and ROM. Baseline:  Goal status: INITIAL  ASSESSMENT:  CLINICAL IMPRESSION: PT session focused on LLE strengthening and standing balance activities. Progressed amb. Distance from 79' to 135' and progressed step height from 2 step to 4 step for RLE tap ups to increase weight bearing on LLE.  Attempted gait training in // bars with reduced UE support but pt reported discomfort in LLE so used LUE on // bar with RUE placed on PT's forearm.  Pt has met STG's #1-5;  STG #6 is in progress with wheelchair eval scheduled on 04-24-24.  Cont with POC.   OBJECTIVE IMPAIRMENTS: Abnormal gait, decreased activity tolerance, decreased balance, decreased cognition, decreased ROM, decreased strength, and increased edema.   ACTIVITY LIMITATIONS: carrying, lifting, bending, standing, squatting, stairs, transfers, bed mobility, bathing, toileting, hygiene/grooming, and locomotion level  PARTICIPATION LIMITATIONS: meal prep, cleaning, laundry, shopping, and community activity  PERSONAL FACTORS: Behavior pattern, Past/current experiences, and 1-2 comorbidities: osteoporosis & s/p ORIF Lt femur are also affecting patient's functional outcome.   REHAB POTENTIAL: Excellent  CLINICAL DECISION MAKING: Evolving/moderate complexity  EVALUATION COMPLEXITY:  Moderate  PLAN:  PT FREQUENCY: 3x/week  PT DURATION: 8 weeks  PLANNED INTERVENTIONS: 97110-Therapeutic exercises, 97530- Therapeutic activity, [id number]- Neuromuscular re-education, [phone]- Self Care, [id number]- Gait training, and Patient/Family education  PLAN FOR NEXT SESSION: gait train with RW;  SciFit W/c eval scheduled for 04-24-24  [place]anna [place], PT 04/23/2024, 8:09 PM        "

## 2024-04-23 ENCOUNTER — Encounter: Payer: Self-pay | Admitting: Physical Therapy

## 2024-04-24 ENCOUNTER — Ambulatory Visit: Admitting: Physical Therapy

## 2024-04-24 DIAGNOSIS — R2681 Unsteadiness on feet: Secondary | ICD-10-CM

## 2024-04-24 DIAGNOSIS — R2689 Other abnormalities of gait and mobility: Secondary | ICD-10-CM

## 2024-04-24 DIAGNOSIS — M6281 Muscle weakness (generalized): Secondary | ICD-10-CM

## 2024-04-24 NOTE — Therapy (Unsigned)
 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   Past Surgical History:  Procedure Laterality Date   dental implants     ORIF FEMUR FRACTURE Left 01/02/2024   Procedure: OPEN REDUCTION INTERNAL FIXATION (ORIF) DISTAL FEMUR FRACTURE;  Surgeon: Kendal Franky SQUIBB, MD;  Location: [facility] OR;  Service: Orthopedics;  Laterality: Left;   vns implant  2001   has been turned off   Patient Active Problem List   Diagnosis Date Noted   Femur fracture, left (HCC) 12/30/2023   Acute cystitis 02/28/2019   Acute vaginitis 02/28/2019   Irregular periods 02/28/2019   Menopausal syndrome 02/28/2019   Mental disability 02/28/2019   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  Rationale for Evaluation and Treatment {HABREHAB:27488}  SUBJECTIVE:                                                                                                                                                                                           SUBJECTIVE STATEMENT: Pt presents for wheelchair evaluation. ***  PRECAUTIONS: {Therapy precautions:24002}  RED FLAGS: {PT Red Flags:29287}  WEIGHT BEARING RESTRICTIONS {Yes ***/No:24003}    OCCUPATION: ***  PLOF:  {PLOF:24004}  PATIENT GOALS: ***         MEDICAL HISTORY:  Primary diagnosis onset: ***     Medical Diagnosis with ICD-10 code: ***   [] Progressive disease  Relevant future surgeries:  pt had surgery on 9-    Height:  5'6  Weight: 153# Explain recent changes or trends in weight:    Gained approx. 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       Cardio Status:  Functional Limitations:   [] Intact  []  Impaired      Respiratory Status:  Functional Limitations:   [] Intact  [] Impaired   [] SOB [] COPD [] O2 Dependent ______LPM  [] Ventilator Dependent  Resp equip:  Objective Measure(s):   Orthotics:   [] Amputee:                                                             [] Prosthesis:        HOME ENVIRONMENT:  [x] House [] Condo/town home [] Apartment [] Asst living [] LTCF         [] Own  [] Rent   [] Lives alone [x] Lives with others - has PCA 110 hrs/week                         Hours without assistance: 0  [] Home is accessible to patient                                 Storage of wheelchair:  [] In home   [] Other Comments:        COMMUNITY :  TRANSPORTATION:  [x] Car [] Recruitment Consultant [] Adapted w/c Lift []  Ambulance [] Other:                     [] Sits in wheelchair during transport   Where is w/c stored during transport?  [] [place]  []  [employer]  r   [] Self-Driver       Drive while in  Biomedical Scientist [] yes [] no   Employment and/or school:  Specific requirements pertaining to mobility        Other:  COMMUNICATION:  Verbal Communication  [x] WFL [x] receptive [] WFL [] expressive [] Understandable  [x] Difficult to understand  [] non-communicative  Primary Language:_English_____________ 2nd:_____________  Communication provided by:[] Patient [x] Family [] Caregiver [] Translator   [] Uses an paramedic device     Manufacturer/Model :                                                                MOBILITY/BALANCE:  Sitting Balance  Standing Balance  Transfers  Ambulation   [x] WFL      [] WFL  [] Independent  []  Independent   [] Uses UE for balance in sitting Comments:  [] Uses UE/device for  stability Comments:  [x]  Min assist  []  Ambulates independently with       device:___________________      []  Mod assist  []  Able to ambulate ______ feet        safely/functionally/independently   []  Min assist  [x]  Min assist  []  Max assist  [x]  Non-functional ambulator         History/High risk of falls   []  Mod assist  []  Mod assist  []  Dependent  []  Unable to ambulate   []  Max  assist  []  Max assist  Transfer method:[x] 1 person [] 2 person [] sliding board [] squat pivot [x] stand pivot [] mechanical patient lift  [] other:   []  Unable  []  Unable    Fall History: # of falls in the past 6 months? 1; several falls sustained since 2020 # of near falls in the past 6 months? In 3 months prior to fall in Sept. Pt had multiple near falls    CURRENT SEATING / MOBILITY:  Current Mobility Device: []   None [] Cane/Walker X Manual [] Dependent [] Dependent w/ Tilt rScooter  [] Power (type of control):   Manufacturer:  Model:  Serial #:   Size:  16x18 Color:  Age: 6 yrs   Purchased by whom:   Current condition of mobility base:    Current seating system:                                                                       Age of seating system:    Describe posture in present seating system:    Is the current mobility meeting medical necessity?:  [] Yes [x] No Describe:  Pt has gained 30# and this wheelchair no longer meets her needs;                                     Ability to complete Mobility-Related Activities of Daily Living (MRADL's) with Current Mobility Device:   Move room to room  [] Independent  [] Min [] Mod [] Max assist  [x] Unable  Comments:   Meal prep  [] Independent  [] Min [] Mod [] Max assist  [x] Unable    Feeding  [x] Independent  [] Min [] Mod [] Max assist  [] Unable    Bathing  [] Independent  [] Min [x] Mod [] Max assist  [] Unable    Grooming  [] Independent  [] Min [x] Mod [] Max assist  [] Unable    UE dressing  [] Independent  [x] Min [] Mod [] Max assist  [] Unable    LE dressing  [] Independent    [] Min [x] Mod [] Max assist  [] Unable    Toileting  [] Independent  [] Min [x] Mod [] Max assist  [] Unable    Bowel Mgt: []  Continent [x]  Incontinent []  Accidents []  Diapers []  Colostomy []  Bowel Program:  Bladder Mgt: []  Continent [x]  Incontinent []  Accidents []  Diapers []  Urinal []  Intermittent Cath []  Indwelling Cath []  Supra-pubic Cath     Current Mobility Equipment Trialed/ Ruled Out:    Does not meet mobility needs due to:    Mark all boxes that indicate inability to use the specific equipment listed     Meets needs for safe  independent functional  ambulation  / mobility    Risk of  Falling or History of Falls    Enviromental limitations      Cognition    Safety concerns with  physical ability    Decreased / limitations endurance  & strength     Decreased / limitations  motor skills  & coordination    Pain    Pace /  Speed    Cardiac and/or  respiratory condition    Contra - indicated by diagnosis   Cane/Crutches  []   [x]   []   []   []   []   []   []   []   []   [x]    Walker / Rollator  []  NA   []   [x]   []   []   []   []   []   []   []   []   [x]     Manual Wheelchair [id number]:  []  NA  []   []   []   []   []   [x]   []   [x]   [x]   []   []    Manual W/C (K0005) with power assist  []  NA  [x]   []   []   []   []   []   []   []   []   []   []   Scooter  [x]  NA  []   []   []   []   []   []   []   []   []   []   []    Power Wheelchair: standard joystick  [x]  NA  []   []   []   []   []   []   []   []   []   []   []    Power Wheelchair: alternative controls  [x]  NA  []   []   []   []   []   []   []   []   []   []   []    Summary:  The least costly alternative for independent functional mobility was found to be:    []  Crutch/Cane  []  Walker [x]  Manual w/c  []  Manual w/c with power assist   []  Scooter   []  Power w/c std joystick   []  Power w/c alternative control        []  Requires dependent care mobility device   Cabin Crew for [employer] skills are adequate for safe mobility equipment operation  [x]   Yes  []   No  Patient is willing and motivated to use recommended mobility equipment  [x]   Yes []   No       []  Patient is unable to safely operate mobility equipment independently and requires dependent care equipment Comments:           SENSATION and SKIN ISSUES:  Sensation [x]  Intact  []  Impaired []  Absent []  Hyposensate []  Hypersensate  []  Defensiveness  Location(s) of impairment:    Pressure Relief Method(s):  []  Lean side to side to offload (without risk of falling)  []   W/C push up (4+ times/hour for 15+ seconds) []  Stand up (without risk of falling)    [x]  Other: (Describe): pt does not weight shift due to cognitiive impairments; pt is also incontinent  Effective pressure relief method(s) above can be performed consistently throughout the day: [] Yes  []  No If not, Why?:  Skin Integrity Risk:       []  Low risk           []  Moderate risk            [x]  High risk  If high risk, explain:   Skin Issues/Skin Integrity  Current skin Issues  []  Yes [x]  No []  Intact  []   Red area   []   Open area  []  Scar tissue  []  At risk from prolonged sitting  Where: History of Skin Issues  []  Yes [x]  No Where : When: Stage: Hx of skin flap surgeries  []  Yes [x]  No Where:  When:  Pain: [x]  Yes []  No   Pain Location(s): Lt hip  Intensity scale: (0-10) : pt unable to rate pain intensity - appears to be more soreness than true pain How does pain interfere with mobility and/or MRADLs? - pt expresses discomfort occasionally in standing        MAT EVALUATION:  Neuro-Muscular Status: (Tone, Reflexive, Responses, etc.)     [x]   Intact   []  Spasticity:  []  Hypotonicity  []  Fluctuating  []  Muscle Spasms  []  Poor Righting Reactions/Poor Equilibrium Reactions  []  Primal Reflex(s):    Comments:            COMMENTS:    POSTURE:     Comments:  Pelvis Anterior/Posterior:  []  Neutral   []  Posterior  []  Anterior  []  Fixed - No movement []  Tendency away from neutral []  Flexible []   Self-correction []  External correction Obliquity (viewed from front)  []  WFL []  R  Obliquity []  L Obliquity  []  Fixed - No movement []  Tendency away from neutral []  Flexible []  Self-correction []  External correction Rotation  []  WFL []  R anterior []  L anterior  []  Fixed - No movement []  Tendency away from neutral []  Flexible []  Self-correction []  External correction Tonal Influence Pelvis:  []  Normal []  Flaccid []  Low tone []  Spasticity []  Dystonia []  Pelvis thrust []  Other:    Trunk Anterior/Posterior:  []  WFL []  Thoracic kyphosis []  Lumbar lordosis  []  Fixed - No movement []  Tendency away from neutral []  Flexible []  Self-correction []  External correction  []  WFL []  Convex to left  []  Convex to right []  S-curve   []  C-curve []  Multiple curves []  Tendency away from neutral []  Flexible []  Self-correction []  External correction Rotation of shoulders and upper trunk:  []  Neutral []  Left-anterior []  Right- anterior []  Fixed- no movement []  Tendency away from neutral []  Flexible []  Self correction []  External correction Tonal influence Trunk:  []  Normal []  Flaccid []  Low tone []  Spasticity []  Dystonia []  Other:   Head & Neck  []  Functional []  Flexed    []  Extended []  Rotated right  []  Rotated left []  Laterally flexed right []  Laterally flexed left []  Cervical hyperextension   []  Good head control []  Adequate head control []  Limited head control []  Absent head control Describe tone/movement of head and neck:      Lower Extremity Measurements: LE ROM:  Active ROM Right 04/24/2024 Left 04/24/2024  Hip flexion  Limited active hip flexion due to c/o discomfort in Lt hip  Hip extension    Hip abduction    Hip adduction    Knee flexion    Knee extension    Ankle dorsiflexion    Ankle plantarflexion     (Blank rows = not tested)  LE MMT:  MMT Right 04/24/2024 Left 04/24/2024  Hip flexion    Hip extension    Hip abduction    Hip  adduction    Knee flexion    Knee extension    Ankle dorsiflexion    Ankle plantarflexion     (Blank rows = not tested)  Hip positions:  [x]  Neutral   []  Abducted   []  Adducted  []  Subluxed   []  Dislocated   []  Fixed   []  Tendency away from neutral []  Flexible [x]  Self-correction []  External correction   Hip Windswept:[x]  Neutral  []  Right    []  Left  []  Subluxed   []  Dislocated   []  Fixed   []  Tendency away from neutral [x]  Flexible [x]  Self-correction []  External correction  LE Tone: [x]  Normal []  Low tone []  Spasticity []  Flaccid []  Dystonia []  Rocks/Extends at hip []  Thrust into knee extension []  Pushes legs downward into footrest  Foot positioning: ROM Concerns:  WNL's bil. LE's Dorsiflexed: []  Right   []  Left Plantar flexed: []  Right    []  Left Inversion: []  Right    []  Left Eversion: []  Right    []  Left  LE Edema: []  1+ (Barely detectable impression when finger is pressed into skin) [x]  2+ (slight indentation. 15 seconds to rebound) []  3+ (deeper indentation. 30 seconds to rebound) []  4+ (>30 seconds to rebound)  UE Measurements:  UPPER EXTREMITY ROM:   Active ROM Right 04/24/2024 Left 04/24/2024  Shoulder flexion    Shoulder abduction    Shoulder adduction    Elbow flexion    Elbow extension    Wrist flexion  Wrist extension    (Blank rows = not tested)  UPPER EXTREMITY MMT:  MMT Right 04/24/2024 Left 04/24/2024  Shoulder flexion    Shoulder abduction    Shoulder adduction    Elbow flexion    Elbow extension    Wrist flexion    Wrist extension    Pinch strength    Grip strength    (Blank rows = not tested)  Shoulder Posture:  Right Tendency towards Left  []   Functional []    []   Elevation []    []   Depression []    []   Protraction []    []   Retraction []    []   Internal rotation []    []   External rotation []    []   Subluxed []     UE Tone: []  Normal []  Flaccid []  Low tone []  Spasticity  []  Dystonia []  Other:   UE Edema: []  1+  (Barely detectable impression when finger is pressed into skin) []  2+ (slight indentation. 15 seconds to rebound) []  3+ (deeper indentation. 30 seconds to rebound) []  4+ (>30 seconds to rebound)  Wrist/Hand: Handedness: [x]  Right   []  Left   []  NA: Comments:  Right  Left  [x]   WNL [x]    []   Limitations []    []   Contractures []    []   Fisting []    []   Tremors []    []   Weak grasp []    []   Poor dexterity []    []   Hand movement non functional []    []   Paralysis []         MOBILITY BASE RECOMMENDATIONS and JUSTIFICATION:  MOBILITY BASE  JUSTIFICATION   Manufacturer: [employer] Administrator:    [employer] 5                          Color:  Seat Width:  18   Seat Depth  18    [x]  Manual mobility base (continue below)   []  Scooter/POV  []  Power mobility base   Number of hours per day spent in above selected mobility base:   Typical daily mobility base use Schedule:    [x]  is not a safe, functional ambulator  [x]  limitation prevents from completing a MRADL(s) within a reasonable time frame    [x]  limitation places at high risk of morbidity or mortality secondary to  the attempts to perform a    MRADL(s)  [x]  limitation prevents accomplishing a MRADL(s) entirely  [x]  provide independent mobility  [x]  equipment is a lifetime medical need  [x]  walker or cane inadequate  [x]  any type manual wheelchair      inadequate  [x]  scooter/POV inadequate      []  requires dependent mobility          MANUAL MOBILITY      []  Standard manual wheelchair  K0001      Arm:    []  both []  right  []  left      Foot:   []  both []  right   []  left  []  self-propels wheelchair  []  will use on regular basis  []  chair fits throughout home  []  willing and motivated to use  []  propels with assistance     []  dependent use   []  Standard hemi-manual wheelchair  K0002      Arm:    []  both []  right  []  left      Foot:   []  both []  right   []  left  []  lower seat  height required to foot propel  []  short  stature  []  self-propels wheelchair  []  will use on regular basis  []  chair fits throughout home  []  willing and motivated to use   []  propels with assistance  []  dependent use   []  Lightweight manual wheelchair  K0003      Arm:    []  both []  right  []  left      Foot:   []  both  []  right  []  left                   []  hemi height required  []  medical condition and weight of  wheelchair affect ability to self      propel standard manual wheelchair in the residence  []  can and does self-propel (marginal propulsion skills)  []  daily use _________hours  []  chair fits throughout home  []  willing and motivated to use  []  lower seat height required to foot propel  []  short stature   []  High strength lightweight manual  wheelchair (Breezy Ultra 4)  K0004     Arm:    []  both []  right  []  left     Foot:   []  both []  right   []  left                                                                  []  hemi height required []  medical condition and weight of wheelchair affect ability to self propel while engaging in frequent MRADL(s) that cannot be performed in a standard or lightweight manual wheelchair  []  daily use _________hours  []  chair fits throughout home  []  willing and motivated to use  []  prevent repetitive use injuries   []  lower seat height required to foot propel  []  short stature    [x]  Ultra-lightweight manual wheelchair  K0005     Arm:    [x]  both []  right  []  left     Foot:   []  both []  right  []  left       []  hemi height required  []  heavy duty    Front seat to floor __20___ inches      Rear seat to floor __18.5___ inches      Back height __18.5___ inches     Back angle __standard____ degrees      Front angle _____ degrees  [x]   full-time manual wheelchair user  [x]  Requires individualized fitting and optimal adjustments for multiple features that include adjustable axle configuration, fully adjustable center of gravity, wheel camber, seat and back angle, angle of  seat slope, which cannot be accommodated by a K0001 through K0004 manual wheelchair  [x]  prevent repetitive use injuries  [x]  daily use____10+_____hours   []  user has high activity patterns that frequently require  them  to go out into the community for the purpose of independently accomplishing high level MRADL activities. Examples of these might include a combination of; shopping, work, school, photographer, childcare, independently loading and unloading from a vehicle etc.  []  lower seat height required to foot propel  []  short stature  []  heavy duty -  weight over 250lbs   []  Current chair is a K0005   manufacture:___________________  model:_________________  serial#____________________  age:_________    [  x] First time X9994 user (complete trial)  N/A K0004 time and # of strokes to propel 30 feet: ________seconds _________strokes  X9994 time and # of strokes to propel 30 feet: ________seconds _________strokes  What was the result of the trial between the K0004 and K0005 manual wheelchair? ___    What features of the K0005 w/c are needed as compared to the K0004 base? Why?___    [x]  adjustable seat and back angle changes the angle of seat slope of the frame to attain a gravity assisted position for efficient propulsion and proper weight distribution along the frame     []  the front of the wheelchair will be configured higher than the back of the chair to allow gravity to assist the user with postural stability  [x]  the center of the wheel will be positioned for stability, safety and efficient propulsion  [x]  adjustable axle allows for vertical, horizontal, camber and overall width changes  throughout the wheels for adjustment of the client's exact needs and abilities.   [x]  adjustable axle increases the stability and function of the chair allowing for adjustment of the center of gravity.   [x]  accommodates the client's anatomical position in the chair maximizing independence in mobility  and maneuverability in all environments.   []  create a minimal fixed tilt-in space to assist in positioning.   []  Describe users full-time manual wheelchair activity patterns:___    []  Power assist Comments:  []  prevent repetitive use injuries  []  repetitive strain injury present in    shoulder girdle    []  shoulder pain is (> or =) to 7/10     during manual propulsion       Current Pain _____/10  []  requires conservation of energy to participate in MRADL(s) runable to propel up ramps or curbs using manual wheelchair  []  been K0005 user greater than one year  []  user unwilling to use power      wheelchair (reason): []  less expensive option to power   wheelchair   []  rim activated power assist -      decreased strength   []  Heavy duty manual wheelchair       K0006     Arm:    []  both []  right  []  left     Foot:   []  both []  right  []  left     []  hemi height required    []  Dependent base  []  user exceeds 250lbs  []  non-functional ambulator    []  extreme spasticity  []  over active movement   []  broken frame/hx of repeated     repairs  []  able to self-propel in residence       []  lower seat to floor height required  []  unable to self-propel in residence   []  Extra heavy duty manual wheelchair  K0007     Arm:    []  both []  right  []  left     Foot:   []  both []  right  []  left     []  hemi height required  []  Dependent base  []  user exceeds 300lbs  []  non-functional ambulator    []  able to self-propel in residence   []  lower seat to floor height required  []  unable to self-propel in residence     []  Manual wheelchair with tilt [phone]      (Manual Tilt-n-Space)  []  patient is dependent for transfers  []  patient requires frequent       positioning for pressure relief   []   patient requires frequent      positioning for poor/absent trunk control        []  Stroller Base  []  infant/child   []  unable to propel manual      wheelchair  []  allows for growth  []  non-functional ambulator   []  non-functional UE  []  independent mobility is not a goal at this time    MANUAL FRAME OPTIONS      Push handles  []  extended   []  angle adjustable   [x]  standard  []  caregiver access  []  caregiver assist    []  allows hooking to enable      increased ability to perform ADLs or maintain balance   []  Angle Adjustable Back  []  postural control  []  control of tone/spasticity  []  accommodation of range of motion  []  UE functional control  []  accommodation for seating system    Rear wheel placement  []  std/fixed  [x] fully adjustableramputee   [x]  camber ___2_____degree  [x]  removable rear wheel  []  non-removable rear wheel  Wheel size __24_____  Wheel style_Mag______________________  []  improved UE access to wheels  []  increase propulsion ability  []  improved stability  []  changing angle in space for      improvement of postural stability  []  remove for transport    []  allow for seating system to fit on  base  []  amputee placement  []  1-arm drive access   r R  r L  []  enable propulsion of manual       wheelchair with one arm    []  amputee placement   Wheel rims/ Hand rims  [x]  Standard    []  Specialized-____ []  provide ability to propel manual   []  increase self-propulsion with hand wheelchair weakness/decreased grasp     []  Spoke protector/guard   []  prevent hands from getting caught in spokes   Tires:  [x]  pneumatic  [x]  flat free inserts  []  solid  Style:  []  decrease roll resistance              []  prevent frequent flats  []  increase shock absorbency  []  decrease maintenance   []  decrease pain from road shock    []  decrease spasms from road shock    Wheel Locks:    [x]  push []  pull []  scissor  []  lock wheels for transfers  []  lock wheels from rolling   Brake/wheel lock extension:  [x]  R  [x]  L  []  allow user to operate wheel locks due to decreased reach or strength   Caster housing: std Caster size:    6x1.5                  Style:  soft roll                                         []  suspension fork  []  maneuverability   []  stability of wheelchair   []  durability  []  maintenance  []  angle adjustment for posture  []  allow for feet to come under        wheelchair base  []  allows change in seat to floor  height   []  increase shock absorbency  []  decrease pain from road shock  []  decrease spasms from road    shock   []  Side guards  []  prevent clothing getting caught in wheel or becoming soiled   []   provide hip and pelvic stability  []  eliminates contact between body and wheels  []  limit hand contact with wheels   [x]  Anti-tippers      []  prevent wheelchair from tipping    backward  []  assist caregiver with curbs     POWER MOBILITY      []  Scooter/POV    []  can safely operate   []  can safely transfer   []  has adequate trunk stability   []  cannot functionally propel  manual wheelchair    []  Power mobility base    []  non-ambulatory   []  cannot functionally propel manual wheelchair   []  cannot functionally and safely      operate scooter/POV  []  can safely operate power       wheelchair  []  home is accessible  []  willing to use power wheelchair     Tilt  []  Powered tilt on powered chair  []  Powered tilt on manual chair  []  Manual tilt on manual chair Comments:  []  change position for pressure      []  elief/cannot weight shift   []  change position against      gravitational force on head and      shoulders   []  decrease pain  []  blood pressure management   []  control autonomic dysreflexia  []  decrease respiratory distress  []  management of spasticity  []  management of low tone  []  facilitate postural control   []  rest periods   []  control edema  []  increase sitting tolerance   []  aid with transfers     Recline   []  Power recline on power chair  []  Manual recline on manual chair  Comments:    []  intermittent catheterization  []  manage spasticity  []  accommodate femur to back angle  []  change position for pressure relief/cannot  weight shift rhigh risk of pressure sore development  []  tilt alone does not accomplish     effective pressure relief, maximum pressure relief achieved at -      _______ degrees tilt   _______ degrees recline   []  difficult to transfer to and from bed []  rest periods and sleeping in chair  []  repositioning for transfers  []  bring to full recline for ADL care  []  clothing/diaper changes in chair  []  gravity PEG tube feeding  []  head positioning  []  decrease pain  []  blood pressure management   []  control autonomic dysreflexia  []  decrease respiratory distress  []  user on ventilator     Elevator on mobility base  []  Power wheelchair  []  Scooter  []  increase Indep in transfers   []  increase Indep in ADLs    []  bathroom function and safety  []  kitchen/cooking function and safety  []  shopping  []  raise height for communication at standing level  []  raise height for eye contact which reduces cervical neck strain and pain  []  drive at raised height for safety and navigating crowds  []  Other:   []  Vertical position system  (anterior tilt)     (Drive locks-out)    []  Stand       (Drive enabled)  []  independent weight bearing  []  decrease joint contractures  []  decrease/manage spasticity  []  decrease/manage spasms  []  pressure distribution away from   scapula, sacrum, coccyx, and ischial tuberosity  []  increase digestion and elimination   []  access to counters and cabinets  []  increase reach  []  increase interaction with others at eye level, reduces neck strain  []   increase performance of       MRADL(s)      Power elevating legrest    []  Center mount (Single) 85-170 degrees       []  Standard (Pair) 100-170 degrees  []  position legs at 90 degrees, not available with std power ELR  []  center mount tucks into chair to decrease turning radius in home, not available with std power ELR  []  provide change in position for LE  []  elevate legs during recline    []  maintain placement  of feet on      footplate  []  decrease edema  []  improve circulation  []  actuator needed to elevate legrest  []  actuator needed to articulate legrest preventing knees from flexing  []  Increase ground clearance over      curbs  []   STD (pair) independently                     elevate legrest   POWER WHEELCHAIR CONTROLS      Controls/input device  []  Expandable  []  Non-expandable  []  Proportional  []  Right Hand []  Left Hand  []  Non-proportional/switches/head-array  []  Electrical/proximity         []   Mechanical      Manufacturer:___________________   Type:________________________ []  provides access for controlling wheelchair  []  programming for accurate control  []  progressive disease/changing condition  []  required for alternative drive      controls       []  lacks motor control to operate  proportional drive control  []  unable to understand proportional controls  []  limited movement/strength  []  extraneous movement / tremors / ataxic / spastic       []  Upgraded electronics controller/harness    []  Single power (tilt or recline)   []  Expandable    []  Non-expandable plus   []  Multi-power (tilt, recline, power legrest, power seat lift, vertical positioning system, stand)  []  allows input device to communicate with drive motors  []  harness provides necessary connections between the controller, input device, and seat functions     []  needed in order to operate power seat functions through joystick/ input device  []  required for alternative drive controls     []  Enhanced display  []  required to connect all alternative drive controls   []  required for upgraded joystick      (lite-throw, heavy duty, micro)  []  Allows user to see in which mode and drive the wheelchair is set; necessary for alternate controls       []  Upgraded tracking electronics  []  correct tracking when on uneven surfaces makes switch driving more efficient and less fatiguing  []  increase safety when driving  []   increase ability to traverse thresholds    []  Safety / reset / mode switches     Type:    []  Used to change modes and stop the wheelchair when driving     []  Mount for joystick / input device/switches  []  swing away for access or transfers   []  attaches joystick / input device / switches to wheelchair   []  provides for consistent access  []  midline for optimal placement    []  Attendant controlled joystick plus     mount  []  safety  []  long distance driving  []  operation of seat functions  []  compliance with transportation regulations    []  Battery  []  required to power (power assist / scooter/ power wc / other):   []  Power inverter (24V to  12V)  []  required for ventilator / respiratory equipment / other:     CHAIR OPTIONS MANUAL & POWER      Armrests   [x]  adjustable height []  removable  []  swing away []  fixed  [x]  flip back  []  reclining  [x]  full length pads []  desk []  tube arms []  gel pads  []  provide support with elbow at 90    []  remove/flip back/swing away for  transfers  []  provide support and positioning of upper body    []  allow to come closer to table top  []  remove for access to tables  []  provide support for w/c tray  []  change of height/angles for variable activities   []  Elbow support / Elbow stop  []  keep elbow positioned on arm pad  []  keep arms from falling off arm pad  during tilt and/or recline   Upper Extremity Support  []  Arm trough  []   R  []   L  Style:  []  swivel mount []  fixed mount   []  posterior hand support  []   tray  [x]  full tray  []  joystick cut out  []   R  []   L  Style: padded []  decrease gravitational pull on      shoulders  []  provide support to increase UE  function  []  provide hand support in natural    position  []  position flaccid UE  []  decrease subluxation    []  decrease edema       []  manage spasticity   []  provide midline positioning  []  provide work surface  []  placement for AAC/ Computer/ EADL       Hangers/ Legrests   [x]   __70____ degree  []  Elevating []  articulating  [x]  swing away []  fixed [x]  lift off  []  heavy duty  []  adjustable knee angle  []  adjustable calf panel   []  longer extension tube              []  provide LE support  []  maintain placement of feet on      footplate   []  accommodate lower leg length  []  accommodate to hamstring       tightness  []  enable transfers  []  provide change in position for LE's  []  elevate legs during recline    []  decrease edema  []  durability      Foot support   [x]  footplate [x]  R [x]  L [x]  flip up           [x]  Depth adjustable   [x]  angle adjustable  []  foot board/one piece    []  provide foot support  []  accommodate to ankle ROM  []  allow foot to go under wheelchair base  []  enable transfers     []  Shoe holders  []  position foot    []  decrease / manage spasticity  []  control position of LE  []  stability    []  safety     [x]  Ankle strap/heel      loops  []  support foot on foot support  []  decrease extraneous movement  []  provide input to heel   []  protect foot     []  Amputee adapter []  R  []  L     Style:                  Size:  []  Provide support for stump/residual extremity    [x]  Transportation tie-down  []  to provide crash tested tie-down brackets    []  Crutch/cane holder    []   O2 holder    []  IV hanger   []  Ventilator tray/mount    []  stabilize accessory on wheelchair       Component  Justification     [x]  Seat cushion     Comfort [employer] Embrace Anti-thrust with incontinent liner []  accommodate impaired sensation  []  decubitus ulcers present or history  []  unable to shift weight  []  increase pressure distribution  []  prevent pelvic extension  []  custom required off-the-shelf    seat cushion will not accommodate deformity  []  stabilize/promote pelvis alignment  []  stabilize/promote femur alignment  []  accommodate obliquity  []  accommodate multiple deformity  []  incontinent/accidents  []  low maintenance     []  seat mounts                  []  fixed []  removable  []  attach seat platform/cushion to wheelchair frame    []  Seat wedge    []  provide increased aggressiveness of seat shape to decrease sliding  down in the seat  []  accommodate ROM        []  Cover replacement   []  protect back or seat cushion  []  incontinent/accidents    []  Solid seat / insert    []  support cushion to prevent      hammocking  []  allows attachment of cushion to mobility base    []  Lateral pelvic/thigh/hip     support (Guides)     []  decrease abduction  []  accommodate pelvis  []  position upper legs  []  accommodate spasticity  []  removable for transfers     []  Lateral pelvic/thigh      supports mounts  []  fixed   []  swing-away   []  removable  []  mounts lateral pelvic/thigh supports     []  mounts lateral pelvic/thigh supports swing-away or removable for transfers    []  Medial thigh support (Pommel)  [] decrease adduction  [] accommodate ROM  []  remove for transfers   []  alignment      []  Medial thigh   []  fixed      support mounts      []  swing-away   []  removable  []  mounts medial thigh supports   []  Mounts medial supports swing- away or removable for transfers       Component  Justification   [x]  Back    Comfort [employer] back   []  provide posterior trunk support []  facilitate tone  []  provide lumbar/sacral support []  accommodate deformity  []  support trunk in midline   []  custom required off-the-shelf back support will not accommodate deformity   []  provide lateral trunk support []  accommodate or decrease tone            [x]  Back mounts  []  fixed  [x]  removable  []  attach back rest/cushion to wheelchair frame   []  Lateral trunk      supports  []  R []  L  []  decrease lateral trunk leaning  []  accommodate asymmetry    []  contour for increased contact  []  safety    []  control of tone    []  Lateral trunk      supports mounts  []  fixed  []  swing-away   []  removable  []  mounts lateral trunk supports     []  Mounts lateral trunk supports  swing-away or removable for transfers   [x]  Anterior chest      strap, vest   Shoulder harness with strap guides   []  decrease forward movement of shoulder  []  decrease forward  movement of trunk  []  safety/stability  []  added abdominal support  []  trunk alignment  []  assistance with shoulder control   []  decrease shoulder elevation    [x]  Headrest      []  provide posterior head support  []  provide posterior neck support  []  provide lateral head support  []  provide anterior head support  []  support during tilt and recline  []  improve feeding     []  improve respiration  []  placement of switches  []  safety    []  accommodate ROM   []  accommodate tone  []  improve visual orientation   [x]  Headrest           []  fixed [x]  removable []  flip down      Mounting hardware   []  swing-away laterals/switches  []  mount headrest   []  mounts headrest flip down or  removable for transfers  []  mount headrest swing-away laterals   []  mount switches     []  Neck Support    []  decrease neck rotation  []  decrease forward neck flexion   Pelvic Positioner    []  std hip belt          [x]  padded hip belt  []  dual pull hip belt  []  four point hip belt  []  stabilize tone  []  decrease falling out of chair  []  prevent excessive extension  []  special pull angle to control      rotation  []  pad for protection over boney   prominence  []  promote comfort    []  Essential needs        bag/pouch   []  medicines []  special food rorthotics []  clothing changes  []  diapers  []  catheter/hygiene []  ostomy supplies   The above equipment has a life- long use expectancy.  Growth and changes in medical and/or functional conditions would be the exceptions.   SUMMARY:  Why mobility device was selected; include why a lower level device is not appropriate: ***  ASSESSMENT:  CLINICAL IMPRESSION: Patient is a *** y.o. *** who was seen today for physical therapy evaluation and treatment for ***.    OBJECTIVE IMPAIRMENTS  {opptimpairments:25111}.   ACTIVITY LIMITATIONS {activitylimitations:27494}  PARTICIPATION LIMITATIONS: {participationrestrictions:25113}  PERSONAL FACTORS {Personal factors:25162} are also affecting patient's functional outcome.   REHAB POTENTIAL: {rehabpotential:25112}  CLINICAL DECISION MAKING: {clinical decision making:25114}  EVALUATION COMPLEXITY: {Evaluation complexity:25115}                                   GOALS: One time visit. No goals established.    PLAN: PT FREQUENCY: one time visit    Billiejean Schimek, [place], PT 04/24/2024, 3:35 PM    I concur with the above findings and recommendations of the therapist:  Physician name printed:         Physician's signature:      Date:      "

## 2024-04-26 ENCOUNTER — Encounter: Payer: Self-pay | Admitting: Physical Therapy

## 2024-05-01 ENCOUNTER — Ambulatory Visit: Admitting: Physical Therapy

## 2024-05-01 DIAGNOSIS — R2689 Other abnormalities of gait and mobility: Secondary | ICD-10-CM

## 2024-05-01 DIAGNOSIS — R2681 Unsteadiness on feet: Secondary | ICD-10-CM

## 2024-05-01 DIAGNOSIS — M6281 Muscle weakness (generalized): Secondary | ICD-10-CM

## 2024-05-01 NOTE — Therapy (Unsigned)
 " OUTPATIENT PHYSICAL THERAPY NEURO TREATMENT NOTE   Patient Name: Molly Duffy MRN: [id number] DOB:May 22, 1970, 54 y.o., female Today's Date: 05/02/2024   PCP: Toribio Jerel MATSU., MD REFERRING PROVIDER: Danton Lauraine LABOR, PA-C  END OF SESSION:  PT End of Session - 05/02/24 1718     Visit Number 6    Number of Visits 24    Date for Recertification  05/23/24    Authorization Type Medicare/ [facility]    Authorization Time Period 03-24-24 - 05-30-24    PT Start Time 1519    PT Stop Time 1614    PT Time Calculation (min) 55 min    Equipment Utilized During Treatment Gait belt;Other (comment)   standard RW   Activity Tolerance Patient tolerated treatment well    Behavior During Therapy [facility] for tasks assessed/performed             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   Past Surgical History:  Procedure Laterality Date   dental implants     ORIF FEMUR FRACTURE Left 01/02/2024   Procedure: OPEN REDUCTION INTERNAL FIXATION (ORIF) DISTAL FEMUR FRACTURE;  Surgeon: Kendal Franky SQUIBB, MD;  Location: [facility] OR;  Service: Orthopedics;  Laterality: Left;   vns implant  2001   has been turned off   Patient Active Problem List   Diagnosis Date Noted   Femur fracture, left (HCC) 12/30/2023   Acute cystitis 02/28/2019   Acute vaginitis 02/28/2019   Irregular periods 02/28/2019   Menopausal syndrome 02/28/2019   Mental disability 02/28/2019   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   Partial epilepsy with impairment of consciousness, 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    ONSET DATE: 12-30-23  REFERRING DIAG:    L Distal Femur fracture - s/p ORIF    THERAPY DIAG:  Other abnormalities of gait and mobility  Muscle weakness (generalized)  Unsteadiness on feet  Rationale for Evaluation and Treatment: Rehabilitation  SUBJECTIVE:                                                                                                                                                                                              SUBJECTIVE STATEMENT: Pt's father reports they forgot pt's RW today - left in a hurry:  mother says she spoke with pt's case manager, Oneil Clay and asks that I call him regarding the overhead track system they are trying to get for patient (phone # [phone]).  No changes/problems reported in pt's status Pt accompanied by: parents  PERTINENT HISTORY: Dravet's syndrome, seizures, developmental delay; s/p Lt ORIF distal femur 01-02-24, osteoporosis, lymphedema LLE > RLE  PAIN:  Are you having pain? No pain in sitting; pain in standing   PRECAUTIONS: Fall  RED FLAGS: None   WEIGHT BEARING RESTRICTIONS: No  FALLS: Has patient fallen in last 6 months? Yes. Number of falls 1 - on 12-30-23  LIVING ENVIRONMENT: Lives with: lives with their family Lives in: House/apartment Stairs: No Has following equipment at home: Wheelchair (manual) and tub transfer bench   PLOF: Needs assistance with ADLs, Needs assistance with gait, and Needs assistance with transfers  PATIENT GOALS: mother's goal I want her to walk again  OBJECTIVE:  Note: Objective measures were completed at Evaluation unless otherwise noted.  DIAGNOSTIC FINDINGS: N/A  COGNITION: Overall cognitive status: Impaired and dev. delayed   SENSATION: WFL  COORDINATION: NT due to cognitive deficits  EDEMA:  Lymphedema in bil. LE's with LLE>RLE  POSTURE: rounded shoulders, forward head, and increased thoracic kyphosis  LOWER EXTREMITY ROM:   WFL's   LOWER EXTREMITY MMT:  unable to accurately assess due to cognitive deficits; pt able to flex Rt and Lt hips against gravity   BED MOBILITY:  Findings: Sit to supine CGA Supine to sit SBA  TRANSFERS: Sit to stand: Mod A  Assistive device utilized: Environmental Consultant - 2 wheeled     Stand to sit: Modified independence and Mod A   Assistive device utilized: Environmental Consultant - 2 wheeled      RAMP:  Not tested  CURB: N/A  STAIRS: Not tested  GAIT: Findings: Gait Characteristics: step to pattern, decreased stance time- Left, decreased hip/knee flexion- Right, and antalgic, Distance walked: 10' x 2 reps inside // bars; 32' with RW, Assistive device utilized:Walker - 2 wheeled and // bars for initial gait assessment, Level of assistance: Mod A, and Comments: pt amb. Inside // bars 10' x 2 reps initially, then trialed use of RW  FUNCTIONAL TESTS:  Pt able to sit unsupported on side of mat; able to stand with bil. UE support with min assist                                                                                                                                TREATMENT DATE: 05-01-24  Pt transferred from wheelchair to mat with min assist using stand pivot transfer  TherEx;  LLE strengthening exercises:  Bridging 5 reps LLE SLR with 2# weight placed above knee for increased ease - 10 reps with CGA to maintain knee extension Hip abduction in hooklying - LLE - 10 reps - minimal manual resistance with abdct. & with adduction Lt hip flexion/extension in hooklying 10 reps with 2# weight Lt SAQ with 2# weight 10 reps with approx. 3 sec hold  SciFit level 3.0 x 10 with bil. UE's and LE's; transfer from wheelchait to SciFit seat with min to mod assist; SciFit to w/c with min assist  TherAct:  Pt performed tap ups to 4 block - tap ups 5 reps alternating LE's Pt performed standing balance activity - reached for 10 bean bags in chair on Rt side and tossed in basket with min LUE support initially, holding onto PT's hand, progressing to no UE support Pt stood and danced (weight shifted) to her favorite song - Looking for Love in all the Wrong Places - attempted to facilitate stepping forward/back with Rt foot for LLE SLS weight bearing but pt did not attempt   Gait: Pt gait trained with RW 148' with min assist- min assist  for negotiation of RW; pt noted to stand more erect and to not stand so close to front on RW - more centered inside RW     Access Code: [email or web address] URL: [email or web address] Date: 03/28/2024 Prepared by: Rock Kussmaul  Exercises - Supine Bridge  - 1 x daily - 7 x weekly - 1 sets - 10 reps - Straight Leg Raise  - 1 x daily - 7 x weekly - 1 sets - 10 reps - Hip flexion in hooklying (BOTH KNEES BENT)  - 1 x daily - 7 x weekly - 1 sets - 10 reps - Seated Long Arc Quad  - 1 x daily - 7 x weekly - 1 sets - 10 reps - Sit to Stand with Counter Support  - 1 x daily - 7 x weekly - 1 sets - 10 reps  SciFit - level 1.0 x 7 with bil. UE's and LE's for strengthening and LLE ROM  Gait:  pt gait trained with RW 30' with mod to min assist; cues for correct hand placement  Gait trained from SciFit to other side of gym approx. 90' with RW with min assist  PATIENT EDUCATION: Education details: instructed parents to have pt stand at counter and perform weight shifting laterally Person educated: Parent Education method: Explanation Education comprehension: verbalized understanding  HOME EXERCISE PROGRAM: To be established  GOALS: Goals reviewed with patient? Yes  SHORT TERM GOALS: Target date: 04-25-24  Pt will transfer wheelchair to/from mat with min to mod assist. Baseline: max to mod assist Goal status: Goal met 04-22-24  2.  Pt will stand at counter for at least 2 with UE support with min assist for increased independence with ADL's. Baseline: currently not standing for period of time Goal status: Met per father's report - 04-22-24  3.  Perform bed mobility with CGA. Baseline:  Goal status: Met 04-22-24  4.  Amb. 75' with RW with +1 min assist for increased household accessibility.  Baseline:  Goal status: Met 04-22-24  5.  Caregiver will demonstrate/report understanding of HEP for LLE strengthening and ROM. Baseline:  Goal status: Met 04-22-24  6.  Initiate manual wheelchair  eval with [employer]. Baseline:  Goal status: INITIAL   LONG TERM GOALS: Target date: 05-24-23  Pt will transfer wheelchair to/from mat with min to CGA without holding on to caregiver/PT during transfer. Baseline: max to mod assist Goal status: INITIAL  2.  Pt will stand at counter for at least 5 with UE support with min assist for increased independence with ADL's. Baseline: currently not standing for period of time           Goal status: INITIAL  3.  Amb. 115' with RW with min assist for increased community accessbility Baseline:  Goal status: INITIAL  4.  Amb. 30' without device with min assist from caregiver for increased household amb.  Baseline:  Goal status: INITIAL  5.  Perform sit to stand transfer with SBA for decreased burden of care on caregiver.   Baseline:  Goal status: INITIAL  6.  Caregiver will be independent with updated HEP for LE strengthening and ROM. Baseline:  Goal status: INITIAL  ASSESSMENT:  CLINICAL IMPRESSION: PT session focused on LLE strengthening and standing balance activities. Progressed amb. Distance from 71' to 158' and 45' (on 1st and 2nd reps of gait training).  Pt performed dancing activity to her favorite song with bil. UE support on PT's forearms; attempted to facilitate pt stepping with RLE to increase weight bearing on LLE but she would not attempt this movement.  Pt's posture noted to be more erect and more centered inside RW with pt not positioned too close to front of RW as she has been in previous PT sessions.  Cont with POC.   OBJECTIVE IMPAIRMENTS: Abnormal gait, decreased activity tolerance, decreased balance, decreased cognition, decreased ROM, decreased strength, and increased edema.   ACTIVITY LIMITATIONS: carrying, lifting, bending, standing, squatting, stairs, transfers, bed mobility, bathing, toileting, hygiene/grooming, and locomotion level  PARTICIPATION LIMITATIONS: meal prep, cleaning, laundry, shopping, and community  activity  PERSONAL FACTORS: Behavior pattern, Past/current experiences, and 1-2 comorbidities: osteoporosis & s/p ORIF Lt femur are also affecting patient's functional outcome.   REHAB POTENTIAL: Excellent2  CLINICAL DECISION MAKING: Evolving/moderate complexity  EVALUATION COMPLEXITY: Moderate  PLAN:  PT FREQUENCY: 3x/week  PT DURATION: 8 weeks  PLANNED INTERVENTIONS: 97110-Therapeutic exercises, 97530- Therapeutic activity, [id number]- Neuromuscular re-education, [phone]- Self Care, [id number]- Gait training, and Patient/Family education  PLAN FOR NEXT SESSION: gait train with RW;  SciFit; step up onto 2/ 4 step with LLE leading   Roxanna [place], PT 05/02/2024, 5:23 PM        "

## 2024-05-02 ENCOUNTER — Encounter: Payer: Self-pay | Admitting: Physical Therapy

## 2024-05-06 ENCOUNTER — Ambulatory Visit: Admitting: Physical Therapy

## 2024-05-06 DIAGNOSIS — R2681 Unsteadiness on feet: Secondary | ICD-10-CM

## 2024-05-06 DIAGNOSIS — M6281 Muscle weakness (generalized): Secondary | ICD-10-CM

## 2024-05-06 DIAGNOSIS — R2689 Other abnormalities of gait and mobility: Secondary | ICD-10-CM

## 2024-05-07 ENCOUNTER — Encounter: Payer: Self-pay | Admitting: Physical Therapy

## 2024-05-07 NOTE — Therapy (Signed)
 " OUTPATIENT PHYSICAL THERAPY NEURO TREATMENT NOTE   Patient Name: Molly Duffy MRN: [id number] DOB:[birth year], 54 y.o., female Today's Date: 05/07/2024   PCP: Toribio Jerel MATSU., MD REFERRING PROVIDER: Danton Lauraine LABOR, PA-C  END OF SESSION:  PT End of Session - 05/07/24 1834     Visit Number 7    Number of Visits 24    Date for Recertification  05/23/24    Authorization Type Medicare/ [facility]    Authorization Time Period 03-24-24 - 05-30-24    PT Start Time 1536    PT Stop Time 1622    PT Time Calculation (min) 46 min    Equipment Utilized During Treatment --   standard RW   Activity Tolerance Patient tolerated treatment well    Behavior During Therapy [facility] for tasks assessed/performed             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   Past Surgical History:  Procedure Laterality Date   dental implants     ORIF FEMUR FRACTURE Left 01/02/2024   Procedure: OPEN REDUCTION INTERNAL FIXATION (ORIF) DISTAL FEMUR FRACTURE;  Surgeon: Kendal Franky SQUIBB, MD;  Location: [facility] OR;  Service: Orthopedics;  Laterality: Left;   vns implant  2001   has been turned off   Patient Active Problem List   Diagnosis Date Noted   Femur fracture, left (HCC) 12/30/2023   Acute cystitis 02/28/2019   Acute vaginitis 02/28/2019   Irregular periods 02/28/2019   Menopausal syndrome 02/28/2019   Mental disability 02/28/2019   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   Partial epilepsy with impairment of consciousness, 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    ONSET DATE: 12-30-23  REFERRING DIAG:    L Distal Femur fracture - s/p ORIF    THERAPY DIAG:  Other abnormalities of gait and mobility  Muscle weakness (generalized)  Unsteadiness on feet  Rationale for Evaluation and Treatment: Rehabilitation  SUBJECTIVE:                                                                                                                                                                                              SUBJECTIVE STATEMENT: No changes or issues reported by pt's father; informed pt's father that I spoke with Oneil Clay earlier today regarding track system requested by pt's mother;  Oneil to contact Marval (mother) and get details regarding exactly what is wanted with the track system - such as location/distance in the home Pt accompanied by: father  PERTINENT HISTORY:  Dravet's syndrome, seizures, developmental delay; s/p Lt ORIF distal femur 01-02-24, osteoporosis, lymphedema LLE > RLE  PAIN:  Are you having pain? No pain in sitting; pain in standing   PRECAUTIONS: Fall  RED FLAGS: None   WEIGHT BEARING RESTRICTIONS: No  FALLS: Has patient fallen in last 6 months? Yes. Number of falls 1 - on 12-30-23  LIVING ENVIRONMENT: Lives with: lives with their family Lives in: House/apartment Stairs: No Has following equipment at home: Wheelchair (manual) and tub transfer bench   PLOF: Needs assistance with ADLs, Needs assistance with gait, and Needs assistance with transfers  PATIENT GOALS: mother's goal I want her to walk again  OBJECTIVE:  Note: Objective measures were completed at Evaluation unless otherwise noted.  DIAGNOSTIC FINDINGS: N/A  COGNITION: Overall cognitive status: Impaired and dev. delayed   SENSATION: WFL  COORDINATION: NT due to cognitive deficits  EDEMA:  Lymphedema in bil. LE's with LLE>RLE  POSTURE: rounded shoulders, forward head, and increased thoracic kyphosis  LOWER EXTREMITY ROM:   WFL's   LOWER EXTREMITY MMT:  unable to accurately assess due to cognitive deficits; pt able to flex Rt and Lt hips against gravity   BED MOBILITY:  Findings: Sit to supine CGA Supine to sit SBA  TRANSFERS: Sit to stand: Mod A  Assistive device utilized: Environmental Consultant - 2 wheeled     Stand to sit: Modified independence and Mod A  Assistive  device utilized: Environmental Consultant - 2 wheeled      RAMP:  Not tested  CURB: N/A  STAIRS: Not tested  GAIT: Findings: Gait Characteristics: step to pattern, decreased stance time- Left, decreased hip/knee flexion- Right, and antalgic, Distance walked: 10' x 2 reps inside // bars; 32' with RW, Assistive device utilized:Walker - 2 wheeled and // bars for initial gait assessment, Level of assistance: Mod A, and Comments: pt amb. Inside // bars 10' x 2 reps initially, then trialed use of RW  FUNCTIONAL TESTS:  Pt able to sit unsupported on side of mat; able to stand with bil. UE support with min assist                                                                                                                                TREATMENT DATE: 05-06-24  Pt transferred from wheelchair to mat with min assist using stand pivot transfer  TherEx;  LLE strengthening exercises:  Bridging 5 reps Lt unilateral bridge x 5 reps  LLE SLR with 2# weight placed on lower leg for increased ease - 10 reps with CGA to maintain knee extension; cues for controlled descent Hip abduction in hooklying - LLE - 10 reps - 2# weight placed on distal thigh Lt hip flexion/extension in hooklying 10 reps with 2# weight Lt SAQ with 2# weight 10 reps with approx. 3 sec hold  SciFit level 3.0 x 11 with bil. UE's and LE's; transfer from wheelchait to SciFit seat with min to mod assist;  SciFit to w/c with min assist   Gait: Pt gait trained with RW 230' (2 laps) with min assist and min assist for negotiation of RW; cues to stand erect and to stand in center of RW; pt reported Lt knee pain/discomfort and wanted to sit down after amb. Approx. 200'  - pt took short standing rest break and then continued with ambulation back to mat table  TherAct:  pt stood by mat table - reached for approx. 10 cones from computer table on pt's Lt side and then handed cone to PT's hand placed in various locations on pt's Lt side; then pt took cone  from PT's hand (various heights) on pt's Lt side and handed each cone to father's hand, standing on pt's Rt side; last cone was placed on floor - pt able to reach down and pick up cone with RUE with LUE support on RW  - moderate difficulty     Access Code: [email or web address] URL: [email or web address] Date: 03/28/2024 Prepared by: Rock Kussmaul  Exercises - Supine Bridge  - 1 x daily - 7 x weekly - 1 sets - 10 reps - Straight Leg Raise  - 1 x daily - 7 x weekly - 1 sets - 10 reps - Hip flexion in hooklying (BOTH KNEES BENT)  - 1 x daily - 7 x weekly - 1 sets - 10 reps - Seated Long Arc Quad  - 1 x daily - 7 x weekly - 1 sets - 10 reps - Sit to Stand with Counter Support  - 1 x daily - 7 x weekly - 1 sets - 10 reps  SciFit - level 1.0 x 7 with bil. UE's and LE's for strengthening and LLE ROM  Gait:  pt gait trained with RW 56' with mod to min assist; cues for correct hand placement  Gait trained from SciFit to other side of gym approx. 8' with RW with min assist  PATIENT EDUCATION: Education details: instructed parents to have pt stand at counter and perform weight shifting laterally Person educated: Parent Education method: Explanation Education comprehension: verbalized understanding  HOME EXERCISE PROGRAM: To be established  GOALS: Goals reviewed with patient? Yes  SHORT TERM GOALS: Target date: 04-25-24  Pt will transfer wheelchair to/from mat with min to mod assist. Baseline: max to mod assist Goal status: Goal met 04-22-24  2.  Pt will stand at counter for at least 2 with UE support with min assist for increased independence with ADL's. Baseline: currently not standing for period of time Goal status: Met per father's report - 04-22-24  3.  Perform bed mobility with CGA. Baseline:  Goal status: Met 04-22-24  4.  Amb. 61' with RW with +1 min assist for increased household accessibility.  Baseline:  Goal status: Met 04-22-24  5.  Caregiver will demonstrate/report  understanding of HEP for LLE strengthening and ROM. Baseline:  Goal status: Met 04-22-24  6.  Initiate manual wheelchair eval with [employer]. Baseline:  Goal status: Goal met 04-24-24      LONG TERM GOALS: Target date: 05-24-23  Pt will transfer wheelchair to/from mat with min to CGA without holding on to caregiver/PT during transfer. Baseline: max to mod assist Goal status: INITIAL  2.  Pt will stand at counter for at least 5 with UE support with min assist for increased independence with ADL's. Baseline: currently not standing for period of time           Goal status: INITIAL  3.  Amb. 115' with  RW with min assist for increased community accessbility Baseline:  Goal status: INITIAL  4.  Amb. 30' without device with min assist from caregiver for increased household amb.  Baseline:  Goal status: INITIAL  5.  Perform sit to stand transfer with SBA for decreased burden of care on caregiver.   Baseline:  Goal status: INITIAL  6.  Caregiver will be independent with updated HEP for LE strengthening and ROM. Baseline:  Goal status: INITIAL  ASSESSMENT:  CLINICAL IMPRESSION: PT session focused on LLE strengthening, gait training, and standing balance.  Pt increased amb. Distance from 34' to 230' (2 consecutive laps) for 1st time in today's session; pt reported discomfort in Lt knee after amb. Approx. 200' but was able to take short standing rest break and resume amb. to complete 2nd lap.  Pt able to pick up 1 cone off floor in standing position with LUE support on RW with moderate difficulty.  Pt tolerated exercises well.  Cont with POC.   OBJECTIVE IMPAIRMENTS: Abnormal gait, decreased activity tolerance, decreased balance, decreased cognition, decreased ROM, decreased strength, and increased edema.   ACTIVITY LIMITATIONS: carrying, lifting, bending, standing, squatting, stairs, transfers, bed mobility, bathing, toileting, hygiene/grooming, and locomotion level  PARTICIPATION  LIMITATIONS: meal prep, cleaning, laundry, shopping, and community activity  PERSONAL FACTORS: Behavior pattern, Past/current experiences, and 1-2 comorbidities: osteoporosis & s/p ORIF Lt femur are also affecting patient's functional outcome.   REHAB POTENTIAL: Excellent2  CLINICAL DECISION MAKING: Evolving/moderate complexity  EVALUATION COMPLEXITY: Moderate  PLAN:  PT FREQUENCY: 3x/week  PT DURATION: 8 weeks  PLANNED INTERVENTIONS: 97110-Therapeutic exercises, 97530- Therapeutic activity, 97112- Neuromuscular re-education, [phone]- Self Care, [id number]- Gait training, and Patient/Family education  PLAN FOR NEXT SESSION: gait train with RW;  SciFit; step up onto 2/ 4 step with LLE leading   Roxanna Rock Area, PT 05/07/2024, 6:40 PM        "

## 2024-05-13 ENCOUNTER — Ambulatory Visit: Admitting: Physical Therapy

## 2024-05-13 DIAGNOSIS — R2689 Other abnormalities of gait and mobility: Secondary | ICD-10-CM

## 2024-05-13 DIAGNOSIS — M6281 Muscle weakness (generalized): Secondary | ICD-10-CM

## 2024-05-13 DIAGNOSIS — R2681 Unsteadiness on feet: Secondary | ICD-10-CM

## 2024-05-14 ENCOUNTER — Encounter: Payer: Self-pay | Admitting: Physical Therapy

## 2024-05-14 NOTE — Therapy (Signed)
 " OUTPATIENT PHYSICAL THERAPY NEURO TREATMENT NOTE   Patient Name: Molly Duffy MRN: [id number] DOB:[birth year], 54 y.o., female Today's Date: 05/14/2024   PCP: Toribio Jerel MATSU., MD REFERRING PROVIDER: Danton Lauraine LABOR, PA-C  END OF SESSION:  PT End of Session - 05/14/24 1314     Visit Number 8    Number of Visits 24    Date for Recertification  05/23/24    Authorization Type Medicare/ [employer]    Authorization Time Period 03-24-24 - 05-30-24    Progress Note Due on Visit 10    PT Start Time 1532    PT Stop Time 1621    PT Time Calculation (min) 49 min    Equipment Utilized During Treatment --   pt wearing harness gait vest   Activity Tolerance Patient tolerated treatment well    Behavior During Therapy WFL for tasks assessed/performed             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   Past Surgical History:  Procedure Laterality Date   dental implants     ORIF FEMUR FRACTURE Left 01/02/2024   Procedure: OPEN REDUCTION INTERNAL FIXATION (ORIF) DISTAL FEMUR FRACTURE;  Surgeon: Kendal Franky SQUIBB, MD;  Location: [facility] OR;  Service: Orthopedics;  Laterality: Left;   vns implant  2001   has been turned off   Patient Active Problem List   Diagnosis Date Noted   Femur fracture, left (HCC) 12/30/2023   Acute cystitis 02/28/2019   Acute vaginitis 02/28/2019   Irregular periods 02/28/2019   Menopausal syndrome 02/28/2019   Mental disability 02/28/2019   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   Partial epilepsy with impairment of consciousness, 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    ONSET DATE: 12-30-23  REFERRING DIAG:    L Distal Femur fracture - s/p ORIF    THERAPY DIAG:  Other abnormalities of gait and mobility  Muscle weakness (generalized)  Unsteadiness on feet  Rationale for Evaluation and Treatment: Rehabilitation  SUBJECTIVE:                                                                                                                                                                                              SUBJECTIVE STATEMENT: Pt's father reports pt does well with standing at home - will stand to adjust or fasten pants and able to maintain balance independently   Pt accompanied by: father  PERTINENT HISTORY: Dravet's syndrome, seizures, developmental delay; s/p Lt ORIF distal femur 01-02-24, osteoporosis, lymphedema  LLE > RLE  PAIN:  Are you having pain? No pain in sitting; pain in standing   PRECAUTIONS: Fall  RED FLAGS: None   WEIGHT BEARING RESTRICTIONS: No  FALLS: Has patient fallen in last 6 months? Yes. Number of falls 1 - on 12-30-23  LIVING ENVIRONMENT: Lives with: lives with their family Lives in: House/apartment Stairs: No Has following equipment at home: Wheelchair (manual) and tub transfer bench   PLOF: Needs assistance with ADLs, Needs assistance with gait, and Needs assistance with transfers  PATIENT GOALS: mother's goal I want her to walk again  OBJECTIVE:  Note: Objective measures were completed at Evaluation unless otherwise noted.  DIAGNOSTIC FINDINGS: N/A  COGNITION: Overall cognitive status: Impaired and dev. delayed   SENSATION: WFL  COORDINATION: NT due to cognitive deficits  EDEMA:  Lymphedema in bil. LE's with LLE>RLE  POSTURE: rounded shoulders, forward head, and increased thoracic kyphosis  LOWER EXTREMITY ROM:   WFL's   LOWER EXTREMITY MMT:  unable to accurately assess due to cognitive deficits; pt able to flex Rt and Lt hips against gravity   BED MOBILITY:  Findings: Sit to supine CGA Supine to sit SBA  TRANSFERS: Sit to stand: Mod A  Assistive device utilized: Environmental Consultant - 2 wheeled     Stand to sit: Modified independence and Mod A  Assistive device utilized: Environmental Consultant - 2 wheeled      RAMP:  Not tested  CURB: N/A  STAIRS: Not  tested  GAIT: Findings: Gait Characteristics: step to pattern, decreased stance time- Left, decreased hip/knee flexion- Right, and antalgic, Distance walked: 10' x 2 reps inside // bars; 32' with RW, Assistive device utilized:Walker - 2 wheeled and // bars for initial gait assessment, Level of assistance: Mod A, and Comments: pt amb. Inside // bars 10' x 2 reps initially, then trialed use of RW  FUNCTIONAL TESTS:  Pt able to sit unsupported on side of mat; able to stand with bil. UE support with min assist                                                                                                                                TREATMENT DATE: 05-13-24  Pt transferred from wheelchair to mat with CGA using stand pivot transfer  TherEx;  LLE strengthening exercises:  LLE SLR with 3# weight placed on distal thigh for increased ease - 10 reps with CGA to min assist to maintain knee extension; cues for controlled descent LLE SLR with no weight 10 reps - cues to keep Lt knee extended Hip abduction in hooklying - LLE - 10 reps - 3# weight placed on distal thigh Lt hip flexion/extension in hooklying 10 reps with 3# weight Lt SAQ with 3# weight 10 reps with approx. 2 sec hold  SciFit level 3.0 x 10 with bil. UE's and LE's; transfer from wheelchait to SciFit seat with min to mod assist; SciFit to w/c with min assist  Gait: Pt gait trained with RW 250'(2 laps + 10') with min assist and min assist for negotiation of RW;  coercion needed to complete this distance as pt reported Lt knee discomfort after amb. Approx. 70'  Pt gait trained inside // bars - 1st lap - bil. UE support used on bars - no difficulty 2nd rep (10' only)  pt used PT's forearms for UE support for approx. 5', then RUE support on bar, with LUE support on PT's forearm for UE support - 5' only due to c/o discomfort in Lt knee  TherAct:  pt stood by mat table - RW in front of pt for UE support Pt performed tap ups to 4 step 5  reps each LE, x 2 sets Pt performed kicking bean bags (4 placed on floor) - alternating LE's - with UE support on RW     Access Code: [email or web address] URL: [email or web address] Date: 03/28/2024 Prepared by: Rock Kussmaul  Exercises - Supine Bridge  - 1 x daily - 7 x weekly - 1 sets - 10 reps - Straight Leg Raise  - 1 x daily - 7 x weekly - 1 sets - 10 reps - Hip flexion in hooklying (BOTH KNEES BENT)  - 1 x daily - 7 x weekly - 1 sets - 10 reps - Seated Long Arc Quad  - 1 x daily - 7 x weekly - 1 sets - 10 reps - Sit to Stand with Counter Support  - 1 x daily - 7 x weekly - 1 sets - 10 reps  SciFit - level 1.0 x 7 with bil. UE's and LE's for strengthening and LLE ROM  Gait:  pt gait trained with RW 59' with mod to min assist; cues for correct hand placement  Gait trained from SciFit to other side of gym approx. 64' with RW with min assist  PATIENT EDUCATION: Education details: instructed parents to have pt stand at counter and perform weight shifting laterally Person educated: Parent Education method: Explanation Education comprehension: verbalized understanding  HOME EXERCISE PROGRAM: To be established  GOALS: Goals reviewed with patient? Yes  SHORT TERM GOALS: Target date: 04-25-24  Pt will transfer wheelchair to/from mat with min to mod assist. Baseline: max to mod assist Goal status: Goal met 04-22-24  2.  Pt will stand at counter for at least 2 with UE support with min assist for increased independence with ADL's. Baseline: currently not standing for period of time Goal status: Met per father's report - 04-22-24  3.  Perform bed mobility with CGA. Baseline:  Goal status: Met 04-22-24  4.  Amb. 60' with RW with +1 min assist for increased household accessibility.  Baseline:  Goal status: Met 04-22-24  5.  Caregiver will demonstrate/report understanding of HEP for LLE strengthening and ROM. Baseline:  Goal status: Met 04-22-24  6.  Initiate manual wheelchair  eval with [employer]. Baseline:  Goal status: Goal met 04-24-24      LONG TERM GOALS: Target date: 05-24-23  Pt will transfer wheelchair to/from mat with min to CGA without holding on to caregiver/PT during transfer. Baseline: max to mod assist Goal status: INITIAL  2.  Pt will stand at counter for at least 5 with UE support with min assist for increased independence with ADL's. Baseline: currently not standing for period of time           Goal status: INITIAL  3.  Amb. 115' with RW with min assist for increased community accessbility Baseline:  Goal status:  INITIAL  4.  Amb. 30' without device with min assist from caregiver for increased household amb.  Baseline:  Goal status: INITIAL  5.  Perform sit to stand transfer with SBA for decreased burden of care on caregiver.   Baseline:  Goal status: INITIAL  6.  Caregiver will be independent with updated HEP for LE strengthening and ROM. Baseline:  Goal status: INITIAL  ASSESSMENT:  CLINICAL IMPRESSION: PT session focused on LLE strengthening, gait training, and standing balance.  Pt increased amb. Distance from 79' to 250' with use of RW; pt reported discomfort in Lt knee after approx. 60' amb. Distance but was able to complete 2 full laps of amb. On track with coercion.  Pt able to perform PRE's with 3# weight for some of the exercises in today's session; pt remains unable to perform SLR LLE with Lt knee held in full extension (no weight used).  Cont with POC.   OBJECTIVE IMPAIRMENTS: Abnormal gait, decreased activity tolerance, decreased balance, decreased cognition, decreased ROM, decreased strength, and increased edema.   ACTIVITY LIMITATIONS: carrying, lifting, bending, standing, squatting, stairs, transfers, bed mobility, bathing, toileting, hygiene/grooming, and locomotion level  PARTICIPATION LIMITATIONS: meal prep, cleaning, laundry, shopping, and community activity  PERSONAL FACTORS: Behavior pattern, Past/current  experiences, and 1-2 comorbidities: osteoporosis & s/p ORIF Lt femur are also affecting patient's functional outcome.   REHAB POTENTIAL: Excellent2  CLINICAL DECISION MAKING: Evolving/moderate complexity  EVALUATION COMPLEXITY: Moderate  PLAN:  PT FREQUENCY: 3x/week  PT DURATION: 8 weeks  PLANNED INTERVENTIONS: 97110-Therapeutic exercises, 97530- Therapeutic activity, [id number]- Neuromuscular re-education, [phone]- Self Care, [id number]- Gait training, and Patient/Family education  PLAN FOR NEXT SESSION: gait train with RW;  SciFit; step up onto 2/ 4 step with LLE leading   Roxanna Rock Area, PT 05/14/2024, 1:18 PM        "

## 2024-05-15 ENCOUNTER — Ambulatory Visit: Payer: Self-pay | Admitting: Physical Therapy

## 2024-05-15 DIAGNOSIS — M6281 Muscle weakness (generalized): Secondary | ICD-10-CM

## 2024-05-15 DIAGNOSIS — R2689 Other abnormalities of gait and mobility: Secondary | ICD-10-CM

## 2024-05-15 DIAGNOSIS — R2681 Unsteadiness on feet: Secondary | ICD-10-CM

## 2024-05-15 NOTE — Therapy (Signed)
 " OUTPATIENT PHYSICAL THERAPY NEURO TREATMENT NOTE   Patient Name: Molly Duffy MRN: [id number] DOB:1971/01/20, 54 y.o., female Today's Date: 05/16/2024   PCP: Toribio Jerel MATSU., MD REFERRING PROVIDER: Danton Lauraine LABOR, PA-C  END OF SESSION:  PT End of Session - 05/16/24 1426     Visit Number 9    Number of Visits 24    Date for Recertification  05/23/24    Authorization Type Medicare/ [employer]    Authorization Time Period 03-24-24 - 05-30-24    Progress Note Due on Visit 10    PT Start Time 1436    PT Stop Time 1522    PT Time Calculation (min) 46 min    Equipment Utilized During Treatment Other (comment)   pt wearing harness gait vest   Activity Tolerance Patient tolerated treatment well    Behavior During Therapy WFL for tasks assessed/performed              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   Past Surgical History:  Procedure Laterality Date   dental implants     ORIF FEMUR FRACTURE Left 01/02/2024   Procedure: OPEN REDUCTION INTERNAL FIXATION (ORIF) DISTAL FEMUR FRACTURE;  Surgeon: Kendal Franky SQUIBB, MD;  Location: [facility] OR;  Service: Orthopedics;  Laterality: Left;   vns implant  2001   has been turned off   Patient Active Problem List   Diagnosis Date Noted   Femur fracture, left (HCC) 12/30/2023   Acute cystitis 02/28/2019   Acute vaginitis 02/28/2019   Irregular periods 02/28/2019   Menopausal syndrome 02/28/2019   Mental disability 02/28/2019   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   Partial epilepsy with impairment of consciousness, 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    ONSET DATE: 12-30-23  REFERRING DIAG:    L Distal Femur fracture - s/p ORIF    THERAPY DIAG:  Other abnormalities of gait and mobility  Muscle weakness (generalized)  Unsteadiness on feet  Rationale for Evaluation and Treatment:  Rehabilitation  SUBJECTIVE:                                                                                                                                                                                             SUBJECTIVE STATEMENT:   Father reports no changes since PT session on Tuesday this week   Pt accompanied by: father & PCA, Ronnald  PERTINENT HISTORY: Dravet's syndrome, seizures, developmental delay; s/p Lt ORIF distal femur 01-02-24, osteoporosis, lymphedema LLE > RLE  PAIN:  Are you having pain? No pain in sitting; pain in standing   PRECAUTIONS: Fall  RED FLAGS: None   WEIGHT BEARING RESTRICTIONS: No  FALLS: Has patient fallen in last 6 months? Yes. Number of falls 1 - on 12-30-23  LIVING ENVIRONMENT: Lives with: lives with their family Lives in: House/apartment Stairs: No Has following equipment at home: Wheelchair (manual) and tub transfer bench   PLOF: Needs assistance with ADLs, Needs assistance with gait, and Needs assistance with transfers  PATIENT GOALS: mother's goal I want her to walk again  OBJECTIVE:  Note: Objective measures were completed at Evaluation unless otherwise noted.  DIAGNOSTIC FINDINGS: N/A  COGNITION: Overall cognitive status: Impaired and dev. delayed   SENSATION: WFL  COORDINATION: NT due to cognitive deficits  EDEMA:  Lymphedema in bil. LE's with LLE>RLE  POSTURE: rounded shoulders, forward head, and increased thoracic kyphosis  LOWER EXTREMITY ROM:   WFL's   LOWER EXTREMITY MMT:  unable to accurately assess due to cognitive deficits; pt able to flex Rt and Lt hips against gravity   BED MOBILITY:  Findings: Sit to supine CGA Supine to sit SBA  TRANSFERS: Sit to stand: Mod A  Assistive device utilized: Environmental Consultant - 2 wheeled     Stand to sit: Modified independence and Mod A  Assistive device utilized: Environmental Consultant - 2 wheeled      RAMP:  Not tested  CURB: N/A  STAIRS: Not tested  GAIT: Findings: Gait  Characteristics: step to pattern, decreased stance time- Left, decreased hip/knee flexion- Right, and antalgic, Distance walked: 10' x 2 reps inside // bars; 32' with RW, Assistive device utilized:Walker - 2 wheeled and // bars for initial gait assessment, Level of assistance: Mod A, and Comments: pt amb. Inside // bars 10' x 2 reps initially, then trialed use of RW  FUNCTIONAL TESTS:  Pt able to sit unsupported on side of mat; able to stand with bil. UE support with min assist                                                                                                                                TREATMENT DATE: 05-15-24  Gait:  Pt transferred from wheelchair to RW with CGA   Pt gait trained with RW 115' with CGA to min assist and min assist for negotiation of RW;  coercion needed to complete this distance as pt reported Lt knee discomfort after 50'   TherEx;  LLE strengthening exercises:  LLE SLR with 3# weight placed on distal thigh for increased ease - 10 reps with CGA to min assist to maintain knee extension; cues for controlled descent LLE SLR with no weight 10 reps - cues to keep Lt knee extended Hip abduction in hooklying - LLE - 10 reps - 3# weight placed on distal thigh Lt hip flexion/extension in hooklying 10 reps with 3# weight Lt LAQ with 3# weight in sitting - on side of mat  SciFit level 3.0 x 8 with bil. UE's and LE's; transfer from wheelchait to SciFit seat with min to mod assist; SciFit to w/c with min assist    TherAct:   2 step used - pt performed tap ups with RLE and with LLE to 2 step 10 reps each; stepped up onto 2 step with RLE leading with mod assist due to c/o pain in Lt knee (1 rep)  4 step used - pt performed tap ups with RLE 5 reps - for increased weight bearing LLE and for isometric strengthening     Access Code: [email or web address] URL: [email or web address] Date: 03/28/2024 Prepared by: Rock Kussmaul  Exercises - Supine Bridge  - 1  x daily - 7 x weekly - 1 sets - 10 reps - Straight Leg Raise  - 1 x daily - 7 x weekly - 1 sets - 10 reps - Hip flexion in hooklying (BOTH KNEES BENT)  - 1 x daily - 7 x weekly - 1 sets - 10 reps - Seated Long Arc Quad  - 1 x daily - 7 x weekly - 1 sets - 10 reps - Sit to Stand with Counter Support  - 1 x daily - 7 x weekly - 1 sets - 10 reps   PATIENT EDUCATION: Education details: instructed parents to have pt stand at counter and perform weight shifting laterally Person educated: Parent Education method: Explanation Education comprehension: verbalized understanding  HOME EXERCISE PROGRAM: To be established  GOALS: Goals reviewed with patient? Yes  SHORT TERM GOALS: Target date: 04-25-24  Pt will transfer wheelchair to/from mat with min to mod assist. Baseline: max to mod assist Goal status: Goal met 04-22-24  2.  Pt will stand at counter for at least 2 with UE support with min assist for increased independence with ADL's. Baseline: currently not standing for period of time Goal status: Met per father's report - 04-22-24  3.  Perform bed mobility with CGA. Baseline:  Goal status: Met 04-22-24  4.  Amb. 30' with RW with +1 min assist for increased household accessibility.  Baseline:  Goal status: Met 04-22-24  5.  Caregiver will demonstrate/report understanding of HEP for LLE strengthening and ROM. Baseline:  Goal status: Met 04-22-24  6.  Initiate manual wheelchair eval with [employer]. Baseline:  Goal status: Goal met 04-24-24      LONG TERM GOALS: Target date: 05-24-23  Pt will transfer wheelchair to/from mat with min to CGA without holding on to caregiver/PT during transfer. Baseline: max to mod assist Goal status: INITIAL  2.  Pt will stand at counter for at least 5 with UE support with min assist for increased independence with ADL's. Baseline: currently not standing for period of time           Goal status: INITIAL  3.  Amb. 115' with RW with min assist for increased  community accessbility Baseline:  Goal status: INITIAL  4.  Amb. 30' without device with min assist from caregiver for increased household amb.  Baseline:  Goal status: INITIAL  5.  Perform sit to stand transfer with SBA for decreased burden of care on caregiver.   Baseline:  Goal status: INITIAL  6.  Caregiver will be independent with updated HEP for LE strengthening and ROM. Baseline:  Goal status: INITIAL  ASSESSMENT:  CLINICAL IMPRESSION: PT session focused on LLE strengthening, gait training, and closed chain strengthening of LLE in standing.  Pt able to step up onto 2 step with LLE leading for 1  rep.  Pt had 2 occurrences of Lt knee instability/buckling in today's session - first one occurred after pt amb. 115' and took approx. 2 steps from RW to her wheelchair, without UE support on RW; 2nd occurrence was with descending from 2 step to floor, appearing to be due to decreased eccentric Lt quad strength.  Pt continues to c/o pain in Lt lateral knee region with PRE's and with weight bearing, especially in LLE SLS.  Cont with POC.   OBJECTIVE IMPAIRMENTS: Abnormal gait, decreased activity tolerance, decreased balance, decreased cognition, decreased ROM, decreased strength, and increased edema.   ACTIVITY LIMITATIONS: carrying, lifting, bending, standing, squatting, stairs, transfers, bed mobility, bathing, toileting, hygiene/grooming, and locomotion level  PARTICIPATION LIMITATIONS: meal prep, cleaning, laundry, shopping, and community activity  PERSONAL FACTORS: Behavior pattern, Past/current experiences, and 1-2 comorbidities: osteoporosis & s/p ORIF Lt femur are also affecting patient's functional outcome.   REHAB POTENTIAL: Excellent2  CLINICAL DECISION MAKING: Evolving/moderate complexity  EVALUATION COMPLEXITY: Moderate  PLAN:  PT FREQUENCY: 3x/week  PT DURATION: 8 weeks  PLANNED INTERVENTIONS: 97110-Therapeutic exercises, 97530- Therapeutic activity, 97112-  Neuromuscular re-education, [phone]- Self Care, [id number]- Gait training, and Patient/Family education  PLAN FOR NEXT SESSION: 10th visit PN due next session; check LTG's and renew gait train with RW;  SciFit; step up onto 2/ 4 step with LLE leading   Taleyah Hillman, [place], PT 05/16/2024, 2:28 PM        "

## 2024-05-16 ENCOUNTER — Encounter: Payer: Self-pay | Admitting: Physical Therapy

## 2024-05-20 ENCOUNTER — Ambulatory Visit: Admitting: Physical Therapy

## 2024-05-20 DIAGNOSIS — M6281 Muscle weakness (generalized): Secondary | ICD-10-CM

## 2024-05-20 DIAGNOSIS — R2689 Other abnormalities of gait and mobility: Secondary | ICD-10-CM

## 2024-05-20 DIAGNOSIS — R2681 Unsteadiness on feet: Secondary | ICD-10-CM

## 2024-05-21 ENCOUNTER — Encounter: Payer: Self-pay | Admitting: Physical Therapy

## 2024-05-22 ENCOUNTER — Ambulatory Visit: Admitting: Physical Therapy

## 2024-05-22 DIAGNOSIS — M6281 Muscle weakness (generalized): Secondary | ICD-10-CM

## 2024-05-22 DIAGNOSIS — R2689 Other abnormalities of gait and mobility: Secondary | ICD-10-CM

## 2024-05-23 ENCOUNTER — Encounter: Payer: Self-pay | Admitting: Physical Therapy

## 2024-05-23 NOTE — Therapy (Signed)
 " OUTPATIENT PHYSICAL THERAPY NEURO TREATMENT NOTE/RE-CERT      Patient Name: Molly Duffy MRN: [id number] DOB:08/04/1970, 54 y.o., female Today's Date: 05/23/2024   PCP: Toribio Jerel MATSU., MD REFERRING PROVIDER: Danton Lauraine LABOR, PA-C  END OF SESSION:  PT End of Session - 05/23/24 1649     Visit Number 11    Number of Visits 24    Date for Recertification  06/20/24    Authorization Type Medicare/ [employer]    Authorization Time Period 03-24-24 - 05-30-24;  05-22-24 -07-18-24    Progress Note Due on Visit 20    PT Start Time 1322    PT Stop Time 1420    PT Time Calculation (min) 58 min    Equipment Utilized During Treatment Other (comment)   pt wearing harness gait vest, RW   Activity Tolerance Patient tolerated treatment well    Behavior During Therapy WFL for tasks assessed/performed               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   Past Surgical History:  Procedure Laterality Date   dental implants     ORIF FEMUR FRACTURE Left 01/02/2024   Procedure: OPEN REDUCTION INTERNAL FIXATION (ORIF) DISTAL FEMUR FRACTURE;  Surgeon: Kendal Franky SQUIBB, MD;  Location: [facility] OR;  Service: Orthopedics;  Laterality: Left;   vns implant  2001   has been turned off   Patient Active Problem List   Diagnosis Date Noted   Femur fracture, left (HCC) 12/30/2023   Acute cystitis 02/28/2019   Acute vaginitis 02/28/2019   Irregular periods 02/28/2019   Menopausal syndrome 02/28/2019   Mental disability 02/28/2019   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   Partial epilepsy with impairment of consciousness, 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    ONSET DATE: 12-30-23  REFERRING DIAG:    L Distal Femur fracture - s/p ORIF    THERAPY DIAG:  Other abnormalities of gait and mobility  Muscle weakness (generalized)  Rationale for Evaluation and Treatment:  Rehabilitation  SUBJECTIVE:                                                                                                                                                                                             SUBJECTIVE STATEMENT:   Father reports no problems or changes; says pt c/o some soreness in legs on evening after doing leg press exercise in therapy on Tuesday but says she didn't c/o at all on following day (Wed.)  Pt accompanied  by: father and PCA, Ronnald  PERTINENT HISTORY: Dravet's syndrome, seizures, developmental delay; s/p Lt ORIF distal femur 01-02-24, osteoporosis, lymphedema LLE > RLE  PAIN:  Are you having pain? No pain in sitting; pain in standing   PRECAUTIONS: Fall  RED FLAGS: None   WEIGHT BEARING RESTRICTIONS: No  FALLS: Has patient fallen in last 6 months? Yes. Number of falls 1 - on 12-30-23  LIVING ENVIRONMENT: Lives with: lives with their family Lives in: House/apartment Stairs: No Has following equipment at home: Wheelchair (manual) and tub transfer bench   PLOF: Needs assistance with ADLs, Needs assistance with gait, and Needs assistance with transfers  PATIENT GOALS: mother's goal I want her to walk again  OBJECTIVE:  Note: Objective measures were completed at Evaluation unless otherwise noted.  DIAGNOSTIC FINDINGS: N/A  COGNITION: Overall cognitive status: Impaired and dev. delayed   SENSATION: WFL  COORDINATION: NT due to cognitive deficits  EDEMA:  Lymphedema in bil. LE's with LLE>RLE  POSTURE: rounded shoulders, forward head, and increased thoracic kyphosis  LOWER EXTREMITY ROM:   WFL's   LOWER EXTREMITY MMT:  unable to accurately assess due to cognitive deficits; pt able to flex Rt and Lt hips against gravity   BED MOBILITY:  Findings: Sit to supine CGA Supine to sit SBA  TRANSFERS: Sit to stand: Mod A  Assistive device utilized: Environmental Consultant - 2 wheeled     Stand to sit: Modified independence and Mod A  Assistive  device utilized: Environmental Consultant - 2 wheeled      RAMP:  Not tested  CURB: N/A  STAIRS: Not tested  GAIT: Findings: Gait Characteristics: step to pattern, decreased stance time- Left, decreased hip/knee flexion- Right, and antalgic, Distance walked: 10' x 2 reps inside // bars; 32' with RW, Assistive device utilized:Walker - 2 wheeled and // bars for initial gait assessment, Level of assistance: Mod A, and Comments: pt amb. Inside // bars 10' x 2 reps initially, then trialed use of RW  FUNCTIONAL TESTS:  Pt able to sit unsupported on side of mat; able to stand with bil. UE support with min assist                                                                                                                                TREATMENT DATE: 05-22-24  Gait:  Pt transferred from wheelchair to mat with CGA with bil. UE support from wheelchair   Pt gait trained with RW 115' with CGA to min assist and min assist for negotiation of RW;  coercion needed to complete this distance as pt reported Lt knee discomfort after 50'  Pt gait trained with +2 HHA (pt's PCA, Ronnald, assisted with this by providing HHA with pt's RUE); PT held pt's LUE - 39' with coercion to continue ambulation after approx. 10'   TherEx;  LLE strengthening exercises:  LLE SLR with 3# weight placed on distal thigh for increased ease - 10 reps  with CGA to min assist to maintain knee extension; min assist for controlled descent Hip abduction in hooklying - LLE - 10 reps - 3# weight placed on distal thigh Lt hip flexion/extension in hooklying 10 reps with 3# weight LLE SAQ with 3# weight 10 reps Lt LAQ with 2# weight in sitting - on side of mat Lt unilaral bridging 10 reps  Leg press - bil. LE's 30# - 3 sets 10 reps, with min to mod assist for controlled return; seat at 11  SciFit level 3.5 x 10 with bil. UE's and LE's; transfer from wheelchait to SciFit seat with min to mod assist; SciFit to w/c with min assist - goal to hit 90  steps/min; pt averaged 83- 85 steps/min  TherAct:   4 step used - pt placed Rt foot on step for increased weight bearing and isometric strengthening; Squigzs placed on rolling bedside table - pt transferred individual Squigz (8 total) from Rt side of table to Lt side; then transferred each Squigz from Lt side of table back over to Rt side for LLE weight bearing; pt's Lt knee blocked by this PT's knee to prevent buckling, but it did not occur  Tap ups onto 4 step with RLE 5 reps with mod assist with UE support on RW   Access Code: [email or web address] URL: [email or web address] Date: 03/28/2024 Prepared by: Rock Kussmaul  Exercises - Supine Bridge  - 1 x daily - 7 x weekly - 1 sets - 10 reps - Straight Leg Raise  - 1 x daily - 7 x weekly - 1 sets - 10 reps - Hip flexion in hooklying (BOTH KNEES BENT)  - 1 x daily - 7 x weekly - 1 sets - 10 reps - Seated Long Arc Quad  - 1 x daily - 7 x weekly - 1 sets - 10 reps - Sit to Stand with Counter Support  - 1 x daily - 7 x weekly - 1 sets - 10 reps   PATIENT EDUCATION: Education details: instructed parents to have pt stand at counter and perform weight shifting laterally Person educated: Parent Education method: Explanation Education comprehension: verbalized understanding  HOME EXERCISE PROGRAM: To be established  GOALS: Goals reviewed with patient? Yes  SHORT TERM GOALS: Target date: 04-25-24  Pt will transfer wheelchair to/from mat with min to mod assist. Baseline: max to mod assist Goal status: Goal met 04-22-24  2.  Pt will stand at counter for at least 2 with UE support with min assist for increased independence with ADL's. Baseline: currently not standing for period of time Goal status: Met per father's report - 04-22-24  3.  Perform bed mobility with CGA. Baseline:  Goal status: Met 04-22-24  4.  Amb. 42' with RW with +1 min assist for increased household accessibility.  Baseline:  Goal status: Met 04-22-24  5.  Caregiver  will demonstrate/report understanding of HEP for LLE strengthening and ROM. Baseline:  Goal status: Met 04-22-24  6.  Initiate manual wheelchair eval with [employer]. Baseline:  Goal status: Goal met 04-24-24      LONG TERM GOALS: Target date: 05-24-23  Pt will transfer wheelchair to/from mat with min to CGA without holding on to caregiver/PT during transfer. Baseline: max to mod assist Goal status: Goal met 05-20-24  2.  Pt will stand at counter for at least 5 with UE support with min assist for increased independence with ADL's. Baseline: currently not standing for period of time  Goal status: Goal met 05-20-24 per father's report  3.  Amb. 115' with RW with min assist for increased community accessbility Baseline:  Goal status: Goal met 05-20-24  4.  Amb. 30' without device with min assist from caregiver for increased household amb.  Baseline:  Goal status: Partially met 05-22-24  5.  Perform sit to stand transfer with SBA for decreased burden of care on caregiver.   Baseline:  Goal status: Goal met 05-20-24  6.  Caregiver will be independent with updated HEP for LE strengthening and ROM. Baseline:  Goal status: Goal met 05-20-24   NEW LONG TERM GOALS:  TARGET DATE 06-20-24   Pt will transfer wheelchair to/from mat with SBA without holding on to caregiver/PT during transfer. Baseline: min to CGA - 05-22-24 Goal status: Upgraded goal  2.  Pt will stand at counter for at least 8 with UE support with CGA to SBA for increased independence with ADL's. Baseline: 4-5 per father's report - 05-22-24           Goal status: Upgraded goal  3.  Amb. 8' with RW with CGA for increased community accessbility Baseline:  Goal status:  Upgraded goal  4.  Amb. 30' without device with min assist (HHA)  from caregiver for increased household amb.  Baseline:  Goal status: Ongoing  5.  Perform sit to stand transfer with supervision for decreased burden of care on caregiver.   Baseline:   Goal status: Upgraded goal   6.  Caregiver will be independent with updated HEP as appropriate for LE strengthening and ROM. Baseline:  Goal status: Ongoing  ASSESSMENT:  CLINICAL IMPRESSION: Today's PT session focused on LLE strengthening and gait training.  Pt ambulated 71' with +2 HHA (without use of RW) for first time in PT in today's session.  Pt needed coercion to continue ambulation after 10' but was able to amb. 80' with coercion and encouragement.  Pt performed LLE weight bearing exercise with Rt foot on 4 step without Lt knee buckling in today's session.  Pt increased # of reps on leg press exercise from 20 to 30 (weight remained 30#), but still needs min assist for controlled return of weights due to decreased eccentric Lt quad strength.  Renewal completed for additional 4 weeks.  Cont with POC.   OBJECTIVE IMPAIRMENTS: Abnormal gait, decreased activity tolerance, decreased balance, decreased cognition, decreased ROM, decreased strength, and increased edema.   ACTIVITY LIMITATIONS: carrying, lifting, bending, standing, squatting, stairs, transfers, bed mobility, bathing, toileting, hygiene/grooming, and locomotion level  PARTICIPATION LIMITATIONS: meal prep, cleaning, laundry, shopping, and community activity  PERSONAL FACTORS: Behavior pattern, Past/current experiences, and 1-2 comorbidities: osteoporosis & s/p ORIF Lt femur are also affecting patient's functional outcome.   REHAB POTENTIAL: Excellent2  CLINICAL DECISION MAKING: Evolving/moderate complexity  EVALUATION COMPLEXITY: Moderate  PLAN:  PT FREQUENCY:   2x/week  PT DURATION: 4 weeks (per renewal 05-23-24)  PLANNED INTERVENTIONS: 97110-Therapeutic exercises, 97530- Therapeutic activity, 97112- Neuromuscular re-education, [phone]- Self Care, [id number]- Gait training, and Patient/Family education  PLAN FOR NEXT SESSION: gait train with HHA gait train with RW;  SciFit; step up onto 2/ 4 step with LLE  leading   Roxanna [place], PT 05/23/2024, 4:59 PM        "

## 2024-05-27 ENCOUNTER — Ambulatory Visit: Admitting: Physical Therapy

## 2024-05-29 ENCOUNTER — Ambulatory Visit: Admitting: Physical Therapy

## 2024-06-03 ENCOUNTER — Ambulatory Visit: Admitting: Physical Therapy
